# Patient Record
Sex: Female | Born: 1940 | Race: Black or African American | Hispanic: No | Smoking: Never smoker
Health system: Southern US, Community
[De-identification: ages and names within clinical notes are randomized; demographics above are authoritative.]

## PROBLEM LIST (undated history)

## (undated) DIAGNOSIS — J45901 Unspecified asthma with (acute) exacerbation: Secondary | ICD-10-CM

## (undated) DIAGNOSIS — I10 Essential (primary) hypertension: Secondary | ICD-10-CM

## (undated) DIAGNOSIS — M1990 Unspecified osteoarthritis, unspecified site: Secondary | ICD-10-CM

## (undated) DIAGNOSIS — C801 Malignant (primary) neoplasm, unspecified: Secondary | ICD-10-CM

## (undated) DIAGNOSIS — J189 Pneumonia, unspecified organism: Secondary | ICD-10-CM

## (undated) DIAGNOSIS — Z5189 Encounter for other specified aftercare: Secondary | ICD-10-CM

## (undated) DIAGNOSIS — B1920 Unspecified viral hepatitis C without hepatic coma: Secondary | ICD-10-CM

## (undated) HISTORY — PX: JOINT REPLACEMENT: SHX530

## (undated) HISTORY — PX: OTHER SURGICAL HISTORY: SHX169

---

## 1998-01-26 ENCOUNTER — Ambulatory Visit (HOSPITAL_COMMUNITY): Admission: RE | Admit: 1998-01-26 | Discharge: 1998-01-26 | Payer: Self-pay | Admitting: Family Medicine

## 1998-05-14 ENCOUNTER — Encounter: Admission: RE | Admit: 1998-05-14 | Discharge: 1998-08-12 | Payer: Self-pay | Admitting: Orthopaedic Surgery

## 1999-07-15 ENCOUNTER — Other Ambulatory Visit: Admission: RE | Admit: 1999-07-15 | Discharge: 1999-07-15 | Payer: Self-pay | Admitting: Family Medicine

## 1999-09-03 ENCOUNTER — Ambulatory Visit (HOSPITAL_COMMUNITY): Admission: RE | Admit: 1999-09-03 | Discharge: 1999-09-03 | Payer: Self-pay | Admitting: Family Medicine

## 2000-05-29 ENCOUNTER — Ambulatory Visit (HOSPITAL_COMMUNITY): Admission: RE | Admit: 2000-05-29 | Discharge: 2000-05-29 | Payer: Self-pay | Admitting: Orthopaedic Surgery

## 2000-10-30 ENCOUNTER — Ambulatory Visit (HOSPITAL_COMMUNITY): Admission: RE | Admit: 2000-10-30 | Discharge: 2000-10-30 | Payer: Self-pay

## 2000-10-30 ENCOUNTER — Encounter: Payer: Self-pay | Admitting: Family Medicine

## 2000-12-19 ENCOUNTER — Encounter: Payer: Self-pay | Admitting: Emergency Medicine

## 2000-12-19 ENCOUNTER — Emergency Department (HOSPITAL_COMMUNITY): Admission: EM | Admit: 2000-12-19 | Discharge: 2000-12-19 | Payer: Self-pay | Admitting: Emergency Medicine

## 2001-12-13 ENCOUNTER — Ambulatory Visit (HOSPITAL_COMMUNITY): Admission: RE | Admit: 2001-12-13 | Discharge: 2001-12-13 | Payer: Self-pay | Admitting: Family Medicine

## 2002-01-31 ENCOUNTER — Emergency Department (HOSPITAL_COMMUNITY): Admission: EM | Admit: 2002-01-31 | Discharge: 2002-01-31 | Payer: Self-pay | Admitting: *Deleted

## 2002-02-21 ENCOUNTER — Encounter: Payer: Self-pay | Admitting: Emergency Medicine

## 2002-02-21 ENCOUNTER — Encounter: Payer: Self-pay | Admitting: Surgery

## 2002-02-21 ENCOUNTER — Inpatient Hospital Stay (HOSPITAL_COMMUNITY): Admission: EM | Admit: 2002-02-21 | Discharge: 2002-02-22 | Payer: Self-pay | Admitting: Emergency Medicine

## 2002-02-21 ENCOUNTER — Encounter (INDEPENDENT_AMBULATORY_CARE_PROVIDER_SITE_OTHER): Payer: Self-pay

## 2002-02-21 HISTORY — PX: CHOLECYSTECTOMY: SHX55

## 2002-10-25 ENCOUNTER — Emergency Department (HOSPITAL_COMMUNITY): Admission: EM | Admit: 2002-10-25 | Discharge: 2002-10-25 | Payer: Self-pay

## 2002-10-25 ENCOUNTER — Encounter: Payer: Self-pay | Admitting: Emergency Medicine

## 2003-03-19 ENCOUNTER — Emergency Department (HOSPITAL_COMMUNITY): Admission: EM | Admit: 2003-03-19 | Discharge: 2003-03-19 | Payer: Self-pay | Admitting: Emergency Medicine

## 2003-10-09 ENCOUNTER — Emergency Department (HOSPITAL_COMMUNITY): Admission: EM | Admit: 2003-10-09 | Discharge: 2003-10-09 | Payer: Self-pay | Admitting: Emergency Medicine

## 2003-10-17 ENCOUNTER — Emergency Department (HOSPITAL_COMMUNITY): Admission: EM | Admit: 2003-10-17 | Discharge: 2003-10-17 | Payer: Self-pay | Admitting: Family Medicine

## 2004-04-22 ENCOUNTER — Ambulatory Visit (HOSPITAL_COMMUNITY): Admission: RE | Admit: 2004-04-22 | Discharge: 2004-04-22 | Payer: Self-pay | Admitting: Family Medicine

## 2004-07-01 ENCOUNTER — Ambulatory Visit: Payer: Self-pay | Admitting: Family Medicine

## 2004-07-03 ENCOUNTER — Inpatient Hospital Stay (HOSPITAL_COMMUNITY): Admission: EM | Admit: 2004-07-03 | Discharge: 2004-07-08 | Payer: Self-pay | Admitting: Emergency Medicine

## 2004-07-31 ENCOUNTER — Ambulatory Visit: Payer: Self-pay | Admitting: Family Medicine

## 2004-08-27 ENCOUNTER — Ambulatory Visit: Payer: Self-pay | Admitting: Gastroenterology

## 2004-11-29 ENCOUNTER — Ambulatory Visit: Payer: Self-pay | Admitting: Family Medicine

## 2005-01-30 ENCOUNTER — Emergency Department (HOSPITAL_COMMUNITY): Admission: EM | Admit: 2005-01-30 | Discharge: 2005-01-30 | Payer: Self-pay | Admitting: Emergency Medicine

## 2005-05-14 ENCOUNTER — Ambulatory Visit (HOSPITAL_COMMUNITY): Admission: RE | Admit: 2005-05-14 | Discharge: 2005-05-14 | Payer: Self-pay | Admitting: Family Medicine

## 2005-05-26 ENCOUNTER — Ambulatory Visit: Payer: Self-pay | Admitting: Family Medicine

## 2005-06-05 ENCOUNTER — Emergency Department (HOSPITAL_COMMUNITY): Admission: EM | Admit: 2005-06-05 | Discharge: 2005-06-05 | Payer: Self-pay | Admitting: Emergency Medicine

## 2005-06-18 ENCOUNTER — Emergency Department (HOSPITAL_COMMUNITY): Admission: EM | Admit: 2005-06-18 | Discharge: 2005-06-18 | Payer: Self-pay | Admitting: Emergency Medicine

## 2005-08-06 ENCOUNTER — Emergency Department (HOSPITAL_COMMUNITY): Admission: EM | Admit: 2005-08-06 | Discharge: 2005-08-06 | Payer: Self-pay | Admitting: Emergency Medicine

## 2005-08-09 ENCOUNTER — Emergency Department (HOSPITAL_COMMUNITY): Admission: EM | Admit: 2005-08-09 | Discharge: 2005-08-09 | Payer: Self-pay | Admitting: Emergency Medicine

## 2005-08-16 ENCOUNTER — Emergency Department (HOSPITAL_COMMUNITY): Admission: EM | Admit: 2005-08-16 | Discharge: 2005-08-16 | Payer: Self-pay | Admitting: Family Medicine

## 2005-10-02 ENCOUNTER — Ambulatory Visit: Payer: Self-pay | Admitting: Family Medicine

## 2005-10-06 ENCOUNTER — Ambulatory Visit (HOSPITAL_COMMUNITY): Admission: RE | Admit: 2005-10-06 | Discharge: 2005-10-06 | Payer: Self-pay | Admitting: Family Medicine

## 2005-10-17 ENCOUNTER — Ambulatory Visit: Payer: Self-pay | Admitting: Family Medicine

## 2006-01-13 ENCOUNTER — Ambulatory Visit: Payer: Self-pay | Admitting: Family Medicine

## 2006-01-19 ENCOUNTER — Ambulatory Visit (HOSPITAL_COMMUNITY): Admission: RE | Admit: 2006-01-19 | Discharge: 2006-01-19 | Payer: Self-pay | Admitting: Family Medicine

## 2006-02-13 ENCOUNTER — Ambulatory Visit (HOSPITAL_COMMUNITY): Admission: RE | Admit: 2006-02-13 | Discharge: 2006-02-13 | Payer: Self-pay | Admitting: Family Medicine

## 2006-02-19 ENCOUNTER — Ambulatory Visit: Payer: Self-pay | Admitting: Family Medicine

## 2006-07-22 ENCOUNTER — Ambulatory Visit (HOSPITAL_COMMUNITY): Admission: RE | Admit: 2006-07-22 | Discharge: 2006-07-22 | Payer: Self-pay | Admitting: Family Medicine

## 2006-08-12 ENCOUNTER — Ambulatory Visit (HOSPITAL_COMMUNITY): Admission: RE | Admit: 2006-08-12 | Discharge: 2006-08-13 | Payer: Self-pay | Admitting: Orthopedic Surgery

## 2006-08-12 HISTORY — PX: OTHER SURGICAL HISTORY: SHX169

## 2006-12-09 ENCOUNTER — Ambulatory Visit: Payer: Self-pay | Admitting: Family Medicine

## 2007-01-07 ENCOUNTER — Encounter: Payer: Self-pay | Admitting: Cardiology

## 2007-01-07 ENCOUNTER — Ambulatory Visit (HOSPITAL_COMMUNITY): Admission: RE | Admit: 2007-01-07 | Discharge: 2007-01-07 | Payer: Self-pay | Admitting: Family Medicine

## 2007-02-03 ENCOUNTER — Emergency Department (HOSPITAL_COMMUNITY): Admission: EM | Admit: 2007-02-03 | Discharge: 2007-02-03 | Payer: Self-pay | Admitting: Emergency Medicine

## 2007-08-09 ENCOUNTER — Emergency Department (HOSPITAL_COMMUNITY): Admission: EM | Admit: 2007-08-09 | Discharge: 2007-08-09 | Payer: Self-pay | Admitting: Family Medicine

## 2007-09-25 ENCOUNTER — Emergency Department (HOSPITAL_COMMUNITY): Admission: EM | Admit: 2007-09-25 | Discharge: 2007-09-25 | Payer: Self-pay | Admitting: Emergency Medicine

## 2007-09-30 ENCOUNTER — Ambulatory Visit (HOSPITAL_COMMUNITY): Admission: RE | Admit: 2007-09-30 | Discharge: 2007-09-30 | Payer: Self-pay | Admitting: Family Medicine

## 2007-10-07 ENCOUNTER — Ambulatory Visit: Payer: Self-pay | Admitting: Family Medicine

## 2007-10-07 LAB — CONVERTED CEMR LAB
ALT: 20 units/L (ref 0–35)
AST: 28 units/L (ref 0–37)
BUN: 13 mg/dL (ref 6–23)
Basophils Relative: 1 % (ref 0–1)
Creatinine, Ser: 0.71 mg/dL (ref 0.40–1.20)
Eosinophils Absolute: 0.2 10*3/uL (ref 0.0–0.7)
MCHC: 31.5 g/dL (ref 30.0–36.0)
MCV: 93.6 fL (ref 78.0–100.0)
Monocytes Absolute: 0.6 10*3/uL (ref 0.1–1.0)
Monocytes Relative: 7 % (ref 3–12)
Neutrophils Relative %: 44 % (ref 43–77)
RBC: 4.51 M/uL (ref 3.87–5.11)
RDW: 13.8 % (ref 11.5–15.5)

## 2008-07-02 ENCOUNTER — Emergency Department (HOSPITAL_COMMUNITY): Admission: EM | Admit: 2008-07-02 | Discharge: 2008-07-02 | Payer: Self-pay | Admitting: Emergency Medicine

## 2008-11-19 ENCOUNTER — Emergency Department (HOSPITAL_COMMUNITY): Admission: EM | Admit: 2008-11-19 | Discharge: 2008-11-19 | Payer: Self-pay | Admitting: Emergency Medicine

## 2008-11-23 ENCOUNTER — Ambulatory Visit (HOSPITAL_COMMUNITY): Admission: RE | Admit: 2008-11-23 | Discharge: 2008-11-23 | Payer: Self-pay | Admitting: Family Medicine

## 2008-12-13 ENCOUNTER — Emergency Department (HOSPITAL_COMMUNITY): Admission: EM | Admit: 2008-12-13 | Discharge: 2008-12-13 | Payer: Self-pay | Admitting: Emergency Medicine

## 2009-01-23 ENCOUNTER — Encounter: Admission: RE | Admit: 2009-01-23 | Discharge: 2009-01-23 | Payer: Self-pay | Admitting: Family Medicine

## 2009-04-06 ENCOUNTER — Emergency Department (HOSPITAL_COMMUNITY): Admission: EM | Admit: 2009-04-06 | Discharge: 2009-04-06 | Payer: Self-pay | Admitting: Emergency Medicine

## 2009-12-20 ENCOUNTER — Ambulatory Visit (HOSPITAL_COMMUNITY): Admission: RE | Admit: 2009-12-20 | Discharge: 2009-12-20 | Payer: Self-pay | Admitting: Family Medicine

## 2010-11-30 LAB — BASIC METABOLIC PANEL
BUN: 8 mg/dL (ref 6–23)
CO2: 33 mEq/L — ABNORMAL HIGH (ref 19–32)
Calcium: 8.9 mg/dL (ref 8.4–10.5)
Creatinine, Ser: 0.54 mg/dL (ref 0.4–1.2)
Glucose, Bld: 109 mg/dL — ABNORMAL HIGH (ref 70–99)

## 2010-11-30 LAB — CBC
MCHC: 33.3 g/dL (ref 30.0–36.0)
RDW: 14.1 % (ref 11.5–15.5)

## 2010-11-30 LAB — POCT CARDIAC MARKERS
CKMB, poc: 1.2 ng/mL (ref 1.0–8.0)
Myoglobin, poc: 96.1 ng/mL (ref 12–200)

## 2010-11-30 LAB — DIFFERENTIAL
Basophils Absolute: 0.1 10*3/uL (ref 0.0–0.1)
Basophils Relative: 1 % (ref 0–1)
Monocytes Absolute: 0.6 10*3/uL (ref 0.1–1.0)
Neutro Abs: 4.4 10*3/uL (ref 1.7–7.7)
Neutrophils Relative %: 61 % (ref 43–77)

## 2011-01-10 NOTE — Op Note (Signed)
[facility]  Patient:    Peggy Welch, Peggy Welch Visit Number: [id number] MRN: [id number]          Service Type: SUR Location: 3W 0345 02 Attending Physician:  Charlton Haws Proc. Date: 02/21/02 Admit Date:  02/21/2002 Discharge Date: 02/22/2002                             Operative Report  PREOPERATIVE DIAGNOSIS:  Biliary colic and probable acute cholecystitis.  POSTOPERATIVE DIAGNOSIS:  Biliary colic and chronic calculus cholecystitis.  PROCEDURE PERFORMED:  Laparoscopic cholecystectomy with intraoperative cholangiogram.  SURGEON:  Currie Paris, M.D.  ASSISTANT:  Ollen Gross. Vernell Morgans, M.D.  ANESTHESIA:  General endotracheal.  INDICATION:  This patient is a 70 year old lady who presented to the emergency room this morning having eaten pizza the night before and developed severe right upper quadrant pain, nausea, and vomiting.  Workup showed normal labs, but she had a dilated gallbladder and stones.  She remained tender in the right upper quadrant and continued to have some mild pain even after Toradol, so we elected to proceed to cholecystectomy today.  DESCRIPTION OF PROCEDURE:  The patient was seen in the holding area.  She had no further questions and understood the plans.  She was taken to the operating room and after satisfactory general endotracheal anesthesia had been obtained, the abdomen was prepped and draped.  A total of 0.25% plain Marcaine was used for each incision and the umbilical incision made first.  The fascia was picked up and the peritoneal cavity was entered.  The Hasson was then introduced and the abdomen insufflated to 15.  On placing the camera, we saw no significant abnormalities, although the gallbladder was distended, and there were a lot of adhesions to it.  The patient was placed in reverse Trendelenburg and tilted to the left and an 11 trocar placed in the epigastrium and two 5s bilaterally.  The omental  adhesions were taken down. I had to put a couple of clips on the omentum because there was a small bleeder where the omentum was stuck to the liver.  We were able to get the omentum stripped off of the gallbladder down to the neck of the gallbladder and identified the ampulla.  I identified the cystic artery and the cystic duct and had the triangle of Calot exposed by opening the peritoneum on both sides.  Once I had the anatomy clarified, I put a single clip on the artery and a single clip on the cystic duct really with conjunction with the gallbladder. This was opened and some bile milked out.  A angiocath was used to thread a reddy catheter into the abdominal cavity and this was placed in the cystic duct and held with a clip.  Operative cholangiography showed a fairly long cystic duct with good filling of the common duct and duodenum and filling of the hepatic radicals.  There were no filling defects.  The cystic duct clip was removed and the catheter pulled out.  Three clips were placed on the stay side of the cystic duct and three total on the artery and the duct and artery divided.  The gallbladder was removed from below to above with coagulation in front of the cautery.  It was fairly thin and we spilled a little bile which was suctioned up and once the gallbladder was disconnected, we put in a bag to bring up the abdominal wall.  We spent  several minutes irrigating to make sure everything was dry and once we thought hemostasis was complete, the gallbladder was grasped and brought out through the umbilical port.  The abdomen was reinsufflated and a final irrigation done.  Again, everything appeared to be dry.  The lateral port was removed and there was no bleeding.  The umbilical port was closed with a pursestring.  The abdomen was deflated through the epigastric port.  The skin incisions were closed with 4-0 Monocryl subcuticular plus Steri-Strips.  The patient tolerated the  procedure well.  There were no operative complications.  All counts were correct. Attending Physician:  Charlton Haws DD:  02/21/02 TD:  02/23/02 Job: [id number]

## 2011-01-10 NOTE — H&P (Signed)
[facility]  Patient:    Peggy Welch, Peggy Welch Visit Number: [id number] MRN: [id number]          Service Type: SUR Location: 3W 0345 02 Attending Physician:  Tobey Bride Dictated by:   Currie Paris, M.D. Admit Date:  02/21/2002                           History and Physical  ACCOUNT NO. [id number].  CHIEF COMPLAINT:  Abdominal pain.  HISTORY OF PRESENT ILLNESS:  This is a generally healthy 70 year old lady who ate pizza last night and developed severe abdominal pain, primarily epigastric-right upper quadrant, around bedtime, and presented to the emergency room.  She has been nauseated, vomited three times.  She received some Toradol in the ER and her pain is somewhat better, but she is still hurting in the right upper quadrant.  She relates that she has generally been able to eat anything she wants, although she had a very similar episode to this that did not last as long from eating some hamburger.  At the time she presented to the emergency room, she said her severity was 9 on a scale of 1 to 10, with 10 being very severe.  She could not find anything that relieved it but, as noted, she is feeling better having had the pain medicine.  PAST MEDICAL HISTORY:  Surgeries:  She has had two knee replacements.  She has also had a C-section.  MEDICATIONS:  She takes hydrochlorothiazide.  ALLERGIES:  None known.  HABITS:  Smokes:  None.  Alcohol:  None.  FAMILY HISTORY:  Unremarkable.  REVIEW OF SYSTEMS:  HEENT:  Negative.  CHEST:  Questionable history of some asthma.   HEART:  History of some mild hypertension.  ABDOMEN:  Negative except for HPI.  GENITOURINARY:  Negative.  PHYSICAL EXAMINATION:  GENERAL:  The patient is seen approximately 10:30, was alert, awake.  HEENT:  Head is normocephalic.  Eyes nonicteric.  Pupils round and regular.  NECK:  Supple, no masses or thyromegaly.  CHEST:  Lungs clear to auscultation.  CARDIAC:   Regular.  No murmurs, rubs, or gallops.  ABDOMEN:  Generally soft, but she has some definite tenderness in the right upper quadrant, especially with deep inspiration.  There is no guarding, no rebound, and bowel sounds are normal.  EXTREMITIES:  No cyanosis or edema.  She has good range of motion.  LABORATORY DATA:  Basically negative except for some mild hypokalemia with a potassium of 3.3.  Ultrasound shows a dilated gallbladder with a stone that looks like it is closer to the neck.  Does not look like a thick wall, however.  IMPRESSION: 1. Biliary colic and possibly developing cholecystitis and cholelithiasis. 2. Hypokalemia.  PLAN:  I think she ought to go ahead with a laparoscopic cholecystectomy, and I explained the indications, risks, complications, including conversion to open, bleeding, infection, etc.  I think all questions have been answered.  We are going to try to get her done today.  In the meantime we will give her some IV fluids and try to replete her potassium. Dictated by:   Currie Paris, M.D. Attending Physician:  Tobey Bride DD:  02/21/02 TD:  02/22/02 Job: [id number]

## 2011-01-10 NOTE — Discharge Summary (Signed)
[facility]  Patient:    Peggy Welch, Peggy Welch Visit Number: [id number] MRN: [id number]          Service Type: SUR Location: 3W 0345 02 Attending Physician:  Charlton Haws Dictated by:   Currie Paris, M.D. Admit Date:  02/21/2002 Discharge Date: 02/22/2002                             Discharge Summary  FINAL DIAGNOSES: 1. Chronic cholecystitis, cholelithiasis, and cholesterolosis. 2. Hypokalemia.  CLINICAL HISTORY:  The patient is a 69 year old lady with known gallstones who presented to the emergency room with an acute episode of biliary pain and right upper quadrant tenderness.  She was noted to be on exam, have some tenderness and elected to proceed to surgery.  HOSPITAL COURSE:  The patient was taken to the operating room. Preoperatively, she was given some potassium.  At the time of surgery she had chronic calculus cholecystitis with perhaps a little distention suggesting an early process developing.  A laparoscopic cholecystectomy and operative cholangiogram were done.  She tolerated the procedure well.  The next day she felt good and was ready to go home.  Her vital signs were stable.  She was alert, lungs were clear, her abdomen was soft and benign.  The patient is discharged in satisfactory condition, to follow up in my office in a couple of weeks.  DIET:  Regular.  ACTIVITY:  As tolerated.Dictated by:   Currie Paris, M.D. Attending Physician:  Charlton Haws DD:  03/16/02 TD:  03/22/02 Job: [id number]
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
## 2011-01-10 NOTE — H&P (Signed)
NAMEHENYA, Welch NO.:  [id number]   MEDICAL RECORD NO.:  [id number]          PATIENT TYPE:  INP   LOCATION:  0101                         FACILITY:  [facility]   PHYSICIAN:  Elliot Cousin, M.D.    DATE OF BIRTH:  1940/09/17   DATE OF ADMISSION:  07/02/2004  DATE OF DISCHARGE:                                HISTORY & PHYSICAL   PRIMARY CARE PHYSICIAN:  Dr. Dow Adolph at [facility].   CHIEF COMPLAINT:  Cough, shortness of breath, wheezing.   HISTORY OF PRESENT ILLNESS:  Peggy Welch is a 70 year old lady with a past  medical history significant for asthma who presents to the emergency  department with a 3 to 4-day history of dry cough, shortness of breath with  exertion, and increasing wheezing. The patient has a history of asthma. Her  last asthma attack was a little less than a year ago. The patient states  that her symptoms started with a cold approximately 4 days ago. She  developed a dry cough and an earache but no sore throat, runny nose or  congestion.  The patient also had subjective and objective fever and chills  at home.  At one point she measured her temperature at 102 degrees  Fahrenheit.  The patient presented to her primary care physician's office  the day before yesterday. She saw Dr. Audria Nine. Per the patient history Dr.  Audria Nine started treatment with antibiotic ear drops, oral antibiotic, and  albuterol MDI. The patient's symptoms did not improve. The patient also had  loose stools several times approximately a week ago. The diarrhea has now  resolved.  The patient has also had some intermittent nausea but no vomiting  and no abdominal pain. She denies chest pain. She denies orthopnea. No  recent history of leg swelling. No abdominal pain. No pain with urination.   The patient says that she rarely uses her albuterol inhaler because her  asthma has been well controlled.  She has a questionable history of an  intubation in the 1990's,  however she is not certain if the intubation was  surrounding her asthma or another reason. When the patient was evaluated in  the emergency department her temperature was 100.4. She was oxygenating 94%  on room air.  She was hemodynamically stable.  Her white count was within  normal limits at 5.6 thousand.  Her ABG on room air revealed a pH of 7.4,  PCO2 of 39, PO2 of 64. The patient is still somewhat symptomatic and will  therefore be admitted for further evaluation and management.   PAST MEDICAL HISTORY:  1.  Asthma diagnosed in 1985. Question history of intubation for asthma      versus another etiology.  2.  Prolonged hospitalization including ventilation in the 1990's, the      patient does not remember the etiology.  3.  Seasonal allergies.  4.  Hepatitis C, diagnosed approximately 10 years ago. No history of      interferon treatment.  5.  Hypertension.  6.  Status post bilateral knee replacements.  7.  Status post laparoscopic cholecystectomy in 2003  by Dr. Jamey Ripa.  8.  Status post left breast biopsy in the past which was negative.  9.  Obesity.   MEDICATIONS:  1.  Hydrochlorothiazide 25 mg daily.  2.  Allegra 180 mg daily.  3.  Advair of questioned dose, 1 inhalation daily.  4.  Albuterol MDI 2 puffs p.r.n.  5.  Multivitamin with iron 1 daily.  6.  Aspirin 81 mg daily.   ALLERGIES:  No known drug allergies.   SOCIAL HISTORY:  The [place] separated. [place] She has 7 children. She is retired from [employer].  She denies tobacco and alcohol use. She denies illicit drug use.  She can  read and write.   FAMILY HISTORY:  Her father died in his 44's (cause of death unknown). Her  mother died of heart failure at 64 years of age.   REVIEW OF SYSTEMS:  The patient's review of systems is positive for  bilateral knee pain, occasional swelling in right greater than left ankles.  Occasional low back pain, occasional stress  urinary incontinence  particularly when coughing. Otherwise her review of systems is negative.   PHYSICAL EXAMINATION:  VITAL SIGNS:  Temperature 100.4, blood pressure  172/107, repeated at 144/74, pulse 92, respiratory rate 28, repeated at 20,  oxygen saturation 94% on room air.  GENERAL:  The patient is a pleasant, 70 year old obese African-American  woman who is currently lying in bed in no acute distress.  HEENT:  Head is normocephalic, atraumatic. Pupils are equal, round and  reactive to light.  Extraocular movements are intact. Conjunctivae are  clear. Sclerae are white. Tympanic membranes are clear bilaterally, no  evidence of tympanic membrane erythema or exudates or edema.  Oropharynx  reveals upper partial dentures and native teeth at the bottom with multiple  missing teeth. Mucous membranes are moist, no posterior exudates or  erythema.  NECK:  Supple, obese, no adenopathy, no thyromegaly, no bruit, no JVD.  LUNGS: The patient has mild diffuse expiratory wheezing throughout all lung  fields. She had several dry coughing episodes during the lung exam. Her  breathing is non labored.  HEART:  S1, S2 with no murmurs, rubs or gallops.  ABDOMEN:  Well-healed epigastric and right upper quadrant abdominal scars.  Abdomen is obese, positive bowel sounds, soft, nontender, nondistended. No  hepatosplenomegaly. No mass is palpated.  GU/RECTAL:  Deferred.  EXTREMITIES:  Pedal pulses 2+ bilaterally. The patient has a good range of  motion of all extremities and joints with exception of the left knee with  very limited flexion and extension of the knees. She does have palpable  prosthetic knees bilaterally.  No pretibial edema, no pedal edema  bilaterally.  NEUROLOGIC:  The patient is alert and oriented x3. Cranial nerves II-XII  intact. Strength is 5/5 throughout with exception of flexion of the left  knee. Sensation is intact throughout. Cerebellar is intact.  ADMISSION LABORATORIES:   Chest x-ray reveals no acute disease.  WBC 5600,  hemoglobin 13.3, platelets 329,000.  Sodium 133, potassium 4.3, chloride  101, CO2 26, glucose 102, BUN 8, creatinine 0.8, calcium 8.5, total protein  7.1, albumin 3.5.  ABG on room air:  PH 7.447, PCO2 39, PO2 64.2.   ASSESSMENT:  Cough, shortness of breath and wheezing all consistent with  asthma or bronchitic asthma exacerbation.  The patient did have a low-grade  fever in the emergency department earlier, and history of overt fever at  home.  The  patient was started on outpatient treatment with antibiotics  although she does not recall the name of the antibiotic.  It was prescribed  2 days ago.   PLAN:  1.  The patient was given Solu-Medrol 125 mg IV x1 by the emergency      department physician.  2.  Will continue steroid treatment with Solu-Medrol 60 mg IV q.8h. Will      continue Advair inhaler b.i.d. Will also continue albuterol nebulizers      q.4h. The patient was given several nebulizer treatments in the      emergency department.  3.  Will start azithromycin 500 mg daily for possible infectious/bacterial      bronchitis.  4.  Will treat symptomatically with Tussionex q.12h p.r.n. as well as      Tessalon Perles.  5.  Will also continue H1 blocker therapy with Claritin 10 mg daily and will      add Pepcid 20 mg b.i.d. as an H2 blocker.  6.  Continue oxygen therapy.  7.  Will cover anticipated elevated blood sugars with a sliding scale      Insulin regimen.     Deni   DF/MEDQ  D:  07/03/2004  T:  07/03/2004  Job:  [id number]   cc:   Maurice March, M.D.  [address] [place]  [place] [address]  Fax: [phone]

## 2011-01-10 NOTE — Discharge Summary (Signed)
NAMEINGER, WIEST             ACCOUNT NO.:  [id number]   MEDICAL RECORD NO.:  [id number]          PATIENT TYPE:  INP   LOCATION:  0343                         FACILITY:  [facility]   PHYSICIAN:  Mobolaji B. Bakare, M.D.DATE OF BIRTH:  [birth year]   DATE OF ADMISSION:  07/02/2004  DATE OF DISCHARGE:  07/08/2004                                 DISCHARGE SUMMARY   PRIMARY CARE PHYSICIAN:  Dr. Audria Nine at [facility]   FINAL DIAGNOSES:  1.  Acute exacerbation of asthma.  2.  Allergic rhinitis.  3.  Hypertension.  4.  Acute bronchitis.   BRIEF HISTORY:  [name] is a 70 year old African-American female with  significant past medical history of asthma and seasonal allergies who  presented with cough, shortness of breath, and wheezing, which did not  respond to regular home regimen.  These acute exacerbations were proceeded  by a cold for about 3-4 days.  At home, she checked her temperature, and it  was 102 degrees Fahrenheit.  She saw Dr. Audria Nine in the office and was  given treatment, but these did not improvement and necessitated the current  hospitalization.  Kindly refer to the H&P for further details.   PERTINENT PHYSICAL FINDINGS:  VITAL SIGNS:  On initial evaluation,  temperature 100.4, blood pressure 172/107, pulse 92, respiratory rate 28,  and oxygen saturation 94%.  GENERAL:  Patient was fairly obese and was not in acute distress.  LUNGS:  The main findings were in the lungs.  She had mild expiratory wheeze  in both lung fields and had several bouts of coughing during the  examination.  There was fairly good air entry in both lungs.  CARDIOVASCULAR:  S1 and S2.  ABDOMEN:  Unremarkable.   LABORATORY FINDINGS:  On admission, ABG with a pH of 7.45, pCO2 39, pO2 64.  Sodium 133, potassium 4.3, chloride 101, CO2 26, BUN 8, creatinine 0.8,  calcium 8.5, albumin 3.5, total protein 7.1, magnesium 2.5.   Chest x-ray showed stable, mild chronic lung disease without any  acute  findings.   HOSPITAL COURSE:  1.  Acute asthmatic exacerbation:  Patient was started on nebulization with      albuterol and Atrovent.  In addition, she was given Advair.  At the time      of her initial evaluation, patient could not recall the dose of Advair;      however, she did recall that she was using 250/50 once daily.  Also      included in her treatment was Solu-Medrol IV, famotidine.  Patient was      started on Singulair, but she could not tolerate it secondary to      dizziness.   1.  Acute bronchitis:  Patient was continued on antibiotics with Zithromax      and Tessalon Perles for cough.  This improved, and she remained afebrile      during the cause of hospitalization.   1.  Allergic rhinosinusitis:  Patient was started on Claritin as Allegra is      nonformullary.  In addition, she received Flonase for nasal congestion.  The symptoms improved gradually.   1.  Hypertension:  Her blood pressure was fairly controlled during the      hospitalization with the institution of hydrochlorothiazide.   DISCHARGE MEDICATIONS:  1.  Albuterol inhaler to use as needed.  2.  Albuterol nebs to use as needed.  3.  Advair 250/50 1 puff b.i.d.  4.  Prednisone to use as directed.  This is a tapering dose over 13 days.  5.  Zithromax 500 mg 1 p.o. q.d. x5 days.  6.  Pepcid 20 mg p.o. b.i.d. x2 weeks.  7.  Flonase 1 spray in both nostrils once daily x6-8 weeks.  8.  Tessalon Perles __________ p.o. b.i.d. x1 week.  9.  Aspirin 81 mg once daily.  10. Hydrochlorothiazide 25 mg p.o. q.d.  11. Allegra 180 mg once daily, use as before.   DIET:  Low salt diet.   FOLLOW UP:  1.  Patient is to follow up with Dr. Audria Nine in 1-2 weeks at [facility]      clinic.  2.  She also has an appointment to follow up with gastroenterology regarding      the diagnosis of hepatitis C.  Patient was encouraged to follow up with      the gastroenterologist.   CONDITION ON DISCHARGE:   Patient was stable.  Lungs were clear clinically at  discharge.  Patient was saturating at more than 90% on room air.  She was  not short of breath on ambulation.      MBB/MEDQ  D:  07/08/2004  T:  07/08/2004  Job:  [id number]   cc:   Maurice March, M.D.  [address] [place]  [place] [address]  Fax: [phone]

## 2011-01-22 ENCOUNTER — Other Ambulatory Visit: Payer: Self-pay | Admitting: Family Medicine

## 2011-01-22 DIAGNOSIS — Z1231 Encounter for screening mammogram for malignant neoplasm of breast: Secondary | ICD-10-CM

## 2011-01-30 ENCOUNTER — Ambulatory Visit
Admission: RE | Admit: 2011-01-30 | Discharge: 2011-01-30 | Disposition: A | Discharge status: Home / Self Care | Payer: Medicare Other | Source: Ambulatory Visit | Attending: Family Medicine | Admitting: Family Medicine

## 2011-01-30 DIAGNOSIS — Z1231 Encounter for screening mammogram for malignant neoplasm of breast: Secondary | ICD-10-CM

## 2011-05-15 LAB — URINALYSIS, ROUTINE W REFLEX MICROSCOPIC
Bilirubin Urine: NEGATIVE
Hgb urine dipstick: NEGATIVE
Specific Gravity, Urine: 1.02
Urobilinogen, UA: 0.2

## 2011-06-12 LAB — DIFFERENTIAL
Basophils Absolute: 0
Basophils Relative: 1
Eosinophils Absolute: 0.3
Eosinophils Relative: 5
Neutrophils Relative %: 57

## 2011-06-12 LAB — COMPREHENSIVE METABOLIC PANEL
ALT: 28
AST: 32
Alkaline Phosphatase: 64
CO2: 30
Calcium: 8.8
Chloride: 103
GFR calc Af Amer: >60
GFR calc non Af Amer: >60
Glucose, Bld: 104 — ABNORMAL HIGH
Sodium: 142
Total Bilirubin: 0.9

## 2011-06-12 LAB — D-DIMER, QUANTITATIVE: D-Dimer, Quant: 0.37

## 2011-06-12 LAB — CBC
Hemoglobin: 12.1
MCHC: 33.1
MCV: 89.9
RBC: 4.05
WBC: 7.4

## 2011-09-26 DIAGNOSIS — E119 Type 2 diabetes mellitus without complications: Secondary | ICD-10-CM | POA: Diagnosis not present

## 2011-10-14 ENCOUNTER — Inpatient Hospital Stay (HOSPITAL_COMMUNITY)
Admission: EM | Admit: 2011-10-14 | Discharge: 2011-10-16 | DRG: 202 | Disposition: A | Discharge status: Home / Self Care | Payer: Medicare Other | Attending: Internal Medicine | Admitting: Internal Medicine

## 2011-10-14 ENCOUNTER — Other Ambulatory Visit: Payer: Self-pay

## 2011-10-14 ENCOUNTER — Encounter (HOSPITAL_COMMUNITY): Payer: Self-pay | Admitting: Emergency Medicine

## 2011-10-14 ENCOUNTER — Emergency Department (HOSPITAL_COMMUNITY): Payer: Medicare Other

## 2011-10-14 DIAGNOSIS — Z79899 Other long term (current) drug therapy: Secondary | ICD-10-CM

## 2011-10-14 DIAGNOSIS — E669 Obesity, unspecified: Secondary | ICD-10-CM | POA: Diagnosis present

## 2011-10-14 DIAGNOSIS — I10 Essential (primary) hypertension: Secondary | ICD-10-CM | POA: Diagnosis present

## 2011-10-14 DIAGNOSIS — J45901 Unspecified asthma with (acute) exacerbation: Secondary | ICD-10-CM

## 2011-10-14 DIAGNOSIS — R5383 Other fatigue: Secondary | ICD-10-CM | POA: Diagnosis not present

## 2011-10-14 DIAGNOSIS — Z6838 Body mass index (BMI) 38.0-38.9, adult: Secondary | ICD-10-CM

## 2011-10-14 DIAGNOSIS — E876 Hypokalemia: Secondary | ICD-10-CM | POA: Diagnosis not present

## 2011-10-14 DIAGNOSIS — E871 Hypo-osmolality and hyponatremia: Secondary | ICD-10-CM | POA: Diagnosis present

## 2011-10-14 DIAGNOSIS — R0602 Shortness of breath: Secondary | ICD-10-CM | POA: Diagnosis not present

## 2011-10-14 DIAGNOSIS — J45909 Unspecified asthma, uncomplicated: Secondary | ICD-10-CM | POA: Diagnosis not present

## 2011-10-14 DIAGNOSIS — R5381 Other malaise: Secondary | ICD-10-CM | POA: Diagnosis not present

## 2011-10-14 HISTORY — DX: Essential (primary) hypertension: I10

## 2011-10-14 HISTORY — DX: Unspecified asthma with (acute) exacerbation: J45.901

## 2011-10-14 LAB — BASIC METABOLIC PANEL
BUN: 9 mg/dL (ref 6–23)
CO2: 30 mEq/L (ref 19–32)
Chloride: 95 mEq/L — ABNORMAL LOW (ref 96–112)
Chloride: 98 mEq/L (ref 96–112)
Creatinine, Ser: 0.62 mg/dL (ref 0.50–1.10)
GFR calc Af Amer: >90 mL/min (ref >90)
GFR calc Af Amer: >90 mL/min (ref >90)
GFR calc non Af Amer: 89 mL/min — ABNORMAL LOW (ref >90)
Potassium: 2.8 mEq/L — ABNORMAL LOW (ref 3.5–5.1)
Potassium: 2.8 mEq/L — ABNORMAL LOW (ref 3.5–5.1)
Sodium: 134 mEq/L — ABNORMAL LOW (ref 135–145)

## 2011-10-14 LAB — BLOOD GAS, ARTERIAL
Acid-Base Excess: 6.2 mmol/L — ABNORMAL HIGH (ref 0.0–2.0)
Drawn by: 295031
FIO2: 0.21 %
O2 Saturation: 94.1 %
Patient temperature: 98.6
pO2, Arterial: 68.2 mmHg — ABNORMAL LOW (ref 80.0–100.0)

## 2011-10-14 LAB — CBC
HCT: 37.3 % (ref 36.0–46.0)
MCHC: 33.5 g/dL (ref 30.0–36.0)
Platelets: 243 10*3/uL (ref 150–400)
RDW: 13.4 % (ref 11.5–15.5)
WBC: 7.7 10*3/uL (ref 4.0–10.5)

## 2011-10-14 LAB — MAGNESIUM: Magnesium: 2.4 mg/dL (ref 1.5–2.5)

## 2011-10-14 LAB — POCT I-STAT TROPONIN I: Troponin i, poc: 0 ng/mL (ref 0.00–0.08)

## 2011-10-14 MED ORDER — ALBUTEROL SULFATE (5 MG/ML) 0.5% IN NEBU: 2.5000 mg | INHALATION_SOLUTION | RESPIRATORY_TRACT | Status: DC | PRN

## 2011-10-14 MED ORDER — HYDROCHLOROTHIAZIDE 25 MG PO TABS
25.0000 mg | ORAL_TABLET | Freq: Every day | ORAL | Status: DC
  Administered 2011-10-15 – 2011-10-16 (×2): 25 mg via ORAL
  Filled 2011-10-14 (×3): qty 1

## 2011-10-14 MED ORDER — ALBUTEROL SULFATE (5 MG/ML) 0.5% IN NEBU
INHALATION_SOLUTION | RESPIRATORY_TRACT | Status: AC
  Filled 2011-10-14: qty 1

## 2011-10-14 MED ORDER — ALBUTEROL SULFATE (5 MG/ML) 0.5% IN NEBU
5.0000 mg | INHALATION_SOLUTION | Freq: Once | RESPIRATORY_TRACT | Status: AC
  Administered 2011-10-14: 5 mg via RESPIRATORY_TRACT
  Filled 2011-10-14: qty 1

## 2011-10-14 MED ORDER — POTASSIUM CHLORIDE CRYS ER 20 MEQ PO TBCR
40.0000 meq | EXTENDED_RELEASE_TABLET | Freq: Once | ORAL | Status: AC
  Administered 2011-10-14: 40 meq via ORAL
  Filled 2011-10-14: qty 2

## 2011-10-14 MED ORDER — ONDANSETRON 4 MG PO TBDP
4.0000 mg | ORAL_TABLET | Freq: Once | ORAL | Status: AC
  Administered 2011-10-14: 4 mg via ORAL
  Filled 2011-10-14: qty 1

## 2011-10-14 MED ORDER — SODIUM CHLORIDE 0.9 % IV SOLN
INTRAVENOUS | Status: DC
  Administered 2011-10-14 – 2011-10-15 (×3): via INTRAVENOUS

## 2011-10-14 MED ORDER — ONDANSETRON HCL 4 MG/2ML IJ SOLN: 4.0000 mg | Freq: Four times a day (QID) | INTRAMUSCULAR | Status: DC | PRN

## 2011-10-14 MED ORDER — SODIUM CHLORIDE 0.9 % IJ SOLN
3.0000 mL | Freq: Two times a day (BID) | INTRAMUSCULAR | Status: DC
  Administered 2011-10-14 – 2011-10-15 (×2): 3 mL via INTRAVENOUS

## 2011-10-14 MED ORDER — IPRATROPIUM BROMIDE 0.02 % IN SOLN
0.5000 mg | Freq: Once | RESPIRATORY_TRACT | Status: AC
  Administered 2011-10-14: 0.5 mg via RESPIRATORY_TRACT
  Filled 2011-10-14: qty 2.5

## 2011-10-14 MED ORDER — POTASSIUM CHLORIDE 10 MEQ/100ML IV SOLN
10.0000 meq | Freq: Once | INTRAVENOUS | Status: AC
  Administered 2011-10-14: 10 meq via INTRAVENOUS
  Filled 2011-10-14: qty 100

## 2011-10-14 MED ORDER — MAGNESIUM SULFATE 40 MG/ML IJ SOLN
2.0000 g | INTRAMUSCULAR | Status: AC
  Administered 2011-10-14: 2 g via INTRAVENOUS
  Filled 2011-10-14: qty 50

## 2011-10-14 MED ORDER — ONDANSETRON HCL 4 MG PO TABS: 4.0000 mg | ORAL_TABLET | Freq: Four times a day (QID) | ORAL | Status: DC | PRN

## 2011-10-14 MED ORDER — ALBUTEROL SULFATE (5 MG/ML) 0.5% IN NEBU
2.5000 mg | INHALATION_SOLUTION | RESPIRATORY_TRACT | Status: DC
  Administered 2011-10-14 (×3): 2.5 mg via RESPIRATORY_TRACT
  Filled 2011-10-14 (×3): qty 0.5

## 2011-10-14 MED ORDER — DEXTROSE 5 % IV SOLN
500.0000 mg | INTRAVENOUS | Status: DC
  Administered 2011-10-14 – 2011-10-15 (×2): 500 mg via INTRAVENOUS
  Filled 2011-10-14 (×3): qty 500

## 2011-10-14 MED ORDER — PREDNISONE 50 MG PO TABS
50.0000 mg | ORAL_TABLET | Freq: Every day | ORAL | Status: DC
  Administered 2011-10-15 – 2011-10-16 (×2): 50 mg via ORAL
  Filled 2011-10-14 (×3): qty 1

## 2011-10-14 MED ORDER — ALENDRONATE SODIUM 70 MG PO TABS: 70.0000 mg | ORAL_TABLET | ORAL | Status: DC

## 2011-10-14 MED ORDER — HYDROCODONE-ACETAMINOPHEN 5-325 MG PO TABS
1.0000 | ORAL_TABLET | ORAL | Status: DC | PRN
  Administered 2011-10-14 – 2011-10-15 (×2): 1 via ORAL
  Filled 2011-10-14 (×2): qty 1

## 2011-10-14 MED ORDER — POTASSIUM CHLORIDE CRYS ER 20 MEQ PO TBCR
40.0000 meq | EXTENDED_RELEASE_TABLET | Freq: Once | ORAL | Status: AC
Start: 2011-10-14 — End: 2011-10-14
  Administered 2011-10-14: 40 meq via ORAL
  Filled 2011-10-14: qty 2

## 2011-10-14 MED ORDER — ENOXAPARIN SODIUM 40 MG/0.4ML ~~LOC~~ SOLN
40.0000 mg | Freq: Every day | SUBCUTANEOUS | Status: DC
  Administered 2011-10-14 – 2011-10-15 (×2): 40 mg via SUBCUTANEOUS
  Filled 2011-10-14 (×5): qty 0.4

## 2011-10-14 NOTE — Progress Notes (Signed)
PHARMACIST - PHYSICIAN COMMUNICATION  CONCERNING: P&T Medication Policy Regarding Oral Bisphosphonates  RECOMMENDATION: Your order for alendronate (Fosamax) has been discontinued at this time.  If the patient's post-hospital medical condition warrants safe use of this class of drugs, please resume the pre-hospital regimen upon discharge.  DESCRIPTION:  Alendronate (Fosamax) can cause severe esophageal erosions in patients who are unable to remain upright at least 30 minutes after taking this medication.   Since brief interruptions in therapy are thought to have minimal impact on bone mineral density, the Pharmacy & Therapeutics Committee has established that bisphosphonate orders should be routinely discontinued during hospitalization.   To override this safety policy and permit administration of Boniva, Fosamax, or Actonel in the hospital, prescribers must write "DO NOT HOLD" in the comments section when placing the order for this class of medications.  Terrilee Files, PharmD 10/14/11 @ 16:03

## 2011-10-14 NOTE — ED Notes (Signed)
Per EMS and pt.  Pt began to have cough yesterday progressing to sob last night.  Tried home O2 and home albuterol neb with no improvement.  Upon arrival EMS noted SOB, wheezing.  5 albuterol neb given enroute.  No chest pain.  Pt states granddaughter had URI.  MD at bedside

## 2011-10-14 NOTE — ED Provider Notes (Signed)
History     CSN: [id number]  Arrival date & time 10/14/11  7829   First MD Initiated Contact with Patient 10/14/11 1004      Chief Complaint  Patient presents with  . Asthma    (Consider location/radiation/quality/duration/timing/severity/associated sxs/prior treatment) HPI History provided by pt.   71yo F w/ h/o HTN and asthma presents w/ approx 12 hours of SOB that started while she was in bed.  Took one nebulized albuterol treatment last night and one this morning w/out relief.  Per EMS O2 sat was 95% when they arrived and breath sound diminished.  She was not in any respiratory distress.  Pt reports that SOB has been associated w/ non-productive cough.  She is unsure of whether or not she has been wheezing or has had chest pain but has some mild right-sided chest discomfort currently.  No known fever.  Peripheral edema is at baseline and denies calf pain.  Patient lives alone.  She has home O2 that she uses on a prn basis.  Does not smoke cigarettes.  No personal or known FH of CAD.    Past Medical History  Diagnosis Date  . Asthma   . Hypertension     History reviewed. No pertinent past surgical history.  History reviewed. No pertinent family history.  History  Substance Use Topics  . Smoking status: Never Smoker   . Smokeless tobacco: Not on file  . Alcohol Use: No    OB History    Grav Para Term Preterm Abortions TAB SAB Ect Mult Living                  Review of Systems  All other systems reviewed and are negative.    Allergies  Review of patient's allergies indicates not on file.  Home Medications  No current outpatient prescriptions on file.  BP 150/81  Pulse 102  Temp 98.5 F (36.9 C)  Resp 22  SpO2 97%  Physical Exam  Nursing note and vitals reviewed. Constitutional: She is oriented to person, place, and time. She appears well-developed and well-nourished. No distress.  HENT:  Head: Normocephalic and atraumatic.  Eyes:       Normal  appearance  Neck: Normal range of motion.  Cardiovascular: Normal rate and regular rhythm.   Pulmonary/Chest: Effort normal and breath sounds normal. No respiratory distress. She has no wheezes.  Abdominal: Soft. Bowel sounds are normal. She exhibits no distension. There is no tenderness.       obese  Musculoskeletal:       No peripheral edema or calf tenderness  Neurological: She is alert and oriented to person, place, and time.  Skin: Skin is warm and dry. No rash noted.  Psychiatric: She has a normal mood and affect. Her behavior is normal.    ED Course  Procedures (including critical care time)   Date: 10/14/2011  Rate: 101  Rhythm: sinus tachycardia  QRS Axis: left  Intervals: normal  ST/T Wave abnormalities: normal  Conduction Disutrbances:none  Narrative Interpretation: LVH  Old EKG Reviewed: unchanged   Labs Reviewed  BASIC METABOLIC PANEL - Abnormal; Notable for the following:    Sodium 134 (*)    Potassium 2.8 (*)    Chloride 95 (*)    Glucose, Bld 106 (*)    GFR calc non Af Amer 89 (*)    All other components within normal limits  BLOOD GAS, ARTERIAL - Abnormal; Notable for the following:    pH, Arterial 7.450 (*)  pO2, Arterial 68.2 (*)    Bicarbonate 30.6 (*)    Acid-Base Excess 6.2 (*)    All other components within normal limits  BASIC METABOLIC PANEL - Abnormal; Notable for the following:    Potassium 2.8 (*)    Calcium 8.2 (*)    GFR calc non Af Amer 88 (*)    All other components within normal limits  BASIC METABOLIC PANEL - Abnormal; Notable for the following:    Potassium 3.2 (*)    Calcium 8.1 (*)    GFR calc non Af Amer 90 (*)    All other components within normal limits  CBC - Abnormal; Notable for the following:    RBC 3.69 (*)    Hemoglobin 11.4 (*)    HCT 34.5 (*)    All other components within normal limits  GLUCOSE, CAPILLARY - Abnormal; Notable for the following:    Glucose-Capillary 107 (*)    All other components within normal  limits  CBC  POCT I-STAT TROPONIN I  PHOSPHORUS  MAGNESIUM   Dg Chest 2 View  10/14/2011  *RADIOLOGY REPORT*  Clinical Data: Shortness of breath.  Generalized weakness.  Chest congestion.  History of asthma.  CHEST - 2 VIEW 10/14/2011:  Comparison: One-view chest x-ray 04/06/2009 and two-view chest x- ray 09/25/2007 [facility].  Findings: Cardiac silhouette upper normal in size to slightly enlarged for the AP technique, unchanged.  Hilar and mediastinal contours otherwise unremarkable.  Calcified granulomata suspected in the left mid and lower lung, unchanged.  Lungs otherwise clear. No pleural effusions.  Eventration of the right anterior hemidiaphragm.  Degenerative changes involving the thoracic spine. No significant interval change.  IMPRESSION: Stable borderline to mild cardiomegaly.  No acute cardiopulmonary disease.  Original Report Authenticated By: Arnell Sieving, M.D.     1. Asthma       MDM  Pt w/ h/o asthma and HTN presents w/ SOB, onset at rest last night.  Associated w/ non-productive cough that started yesterday.  EMS reports that pt was in no respiratory distress, O2 sat 95% on rm air but diminished breath sounds on their exam.  She has just completed another neb tmnt and nml breath sounds currently on my exam.  Afebrile and exam otherwise unremarkable.  RF for ACS include HTN and age but my clinical suspicion is low.  EKG, CXR and basic labs including troponin are pending.  60mg  prednisone ordered.  10:25 AM   Pt resting comfortably and VSS.  Labs sig for neg troponin and hypokalemia; potassium being supplemented currently.  EKG non-ischemic and CXR stable.  Will reassess patient and determine disposition shortly.  12:12 PM   Pt continues cough.  VSS w/ exception of O2 sat which has decreased to 89-91% on rm air at rest.  Dr. Rubin Payor has examined and recommends admission.  [facility] consulted and has accepted pt.  They would like a stat ABG.        Otilio Miu, [place] 10/15/11 1214

## 2011-10-14 NOTE — ED Notes (Signed)
Patient transported to X-ray 

## 2011-10-14 NOTE — H&P (Signed)
PCP:  No primary provider on file.   DOA:  10/14/2011  9:58 AM  Chief Complaint:  Shortness of breath  HPI: 71yo F w/ h/o HTN and asthma presents with progressively worsening shortness of breath that started morning of admission. Per EMS oxygen saturations upon their arrival were 93-95% and breath sounds were diminished on auscultation however, pt was not in respiratory distress. Pt reports that SOB has been associated with nonproductive cough and some wheezing. She also reports similar events in the past. She denies fevers and chills, no chest pain, no specific aggravating or alleviating factors, no abdominal or urinary concerns. Pt lives at home alone and uses oxygen PRN. She denies  Recent sicknesses or sick contacts.  Allergies: No Known Allergies  Prior to Admission medications   Medication Sig Start Date End Date Taking? Authorizing Provider  acetaminophen (TYLENOL) 325 MG tablet Take 650 mg by mouth every 6 (six) hours as needed. For pain   Yes Historical Provider, MD  alendronate (FOSAMAX) 70 MG tablet Take 70 mg by mouth every 7 (seven) days. On Thursdays. Take with a full glass of water on an empty stomach.   Yes Historical Provider, MD  B Complex-C (B-COMPLEX WITH VITAMIN C) tablet Take 1 tablet by mouth daily.   Yes Historical Provider, MD  hydrochlorothiazide (HYDRODIURIL) 25 MG tablet Take 25 mg by mouth daily.   Yes Historical Provider, MD    Past Medical History  Diagnosis Date  . Asthma   . Hypertension     History reviewed. No pertinent past surgical history.  Social History:  reports that she has never smoked. She does not have any smokeless tobacco history on file. She reports that she does not drink alcohol or use illicit drugs.  History reviewed. No pertinent family history.  Review of Systems:  Constitutional: Denies fever, chills, diaphoresis, appetite change and fatigue.  HEENT: Denies photophobia, eye pain, redness, hearing loss, ear pain, congestion,  sore throat, rhinorrhea, sneezing, mouth sores, trouble swallowing, neck pain, neck stiffness and tinnitus.   Respiratory: per HPI Cardiovascular: Denies chest pain, palpitations and leg swelling.  Gastrointestinal: Denies nausea, vomiting, abdominal pain, diarrhea, constipation, blood in stool and abdominal distention.  Genitourinary: Denies dysuria, urgency, frequency, hematuria, flank pain and difficulty urinating.  Musculoskeletal: Denies myalgias, back pain, joint swelling, arthralgias and gait problem.  Skin: Denies pallor, rash and wound.  Neurological: Denies dizziness, seizures, syncope, weakness, light-headedness, numbness and headaches.  Hematological: Denies adenopathy. Easy bruising, personal or family bleeding history  Psychiatric/Behavioral: Denies suicidal ideation, mood changes, confusion, nervousness, sleep disturbance and agitation   Physical Exam:  Filed Vitals:   10/14/11 1017 10/14/11 1153 10/14/11 1530 10/14/11 1534  BP: 150/81 138/70  113/66  Pulse: 102 90  91  Temp: 98.5 F (36.9 C) 98 F (36.7 C)  98.3 F (36.8 C)  TempSrc:    Oral  Resp: 22 20  18   SpO2: 97% 99% 94% 95%    Constitutional: Vital signs reviewed.  Patient is a well-developed and well-nourished in no acute distress and cooperative with exam. Alert and oriented x3.  Head: Normocephalic and atraumatic Ear: TM normal bilaterally Mouth: no erythema or exudates, MMM Eyes: PERRL, EOMI, conjunctivae normal, No scleral icterus.  Neck: Supple, Trachea midline normal ROM, No JVD, mass, thyromegaly, or carotid bruit present.  Cardiovascular: RRR, S1 normal, S2 normal, no MRG, pulses symmetric and intact bilaterally Pulmonary/Chest: CTAB, no wheezes, rales, or rhonchi Abdominal: Soft. Non-tender, non-distended, bowel sounds are normal, no masses, organomegaly,  or guarding present.  GU: no CVA tenderness Musculoskeletal: No joint deformities, erythema, or stiffness, ROM full and no nontender Ext: no  edema and no cyanosis, pulses palpable bilaterally (DP and PT) Hematology: no cervical, inginal, or axillary adenopathy.  Neurological: A&O x3, Strenght is normal and symmetric bilaterally, cranial nerve II-XII are grossly intact, no focal motor deficit, sensory intact to light touch bilaterally.  Skin: Warm, dry and intact. No rash, cyanosis, or clubbing.  Psychiatric: Normal mood and affect. speech and behavior is normal. Judgment and thought content normal. Cognition and memory are normal.   Labs on Admission:  Results for orders placed during the hospital encounter of 10/14/11 (from the past 48 hour(s))  CBC     Status: Normal   Collection Time   10/14/11 10:32 AM      Component Value Range Comment   WBC 7.7  4.0 - 10.5 (K/uL)    RBC 4.08  3.87 - 5.11 (MIL/uL)    Hemoglobin 12.5  12.0 - 15.0 (g/dL)    HCT 11.9  14.7 - 82.9 (%)    MCV 91.4  78.0 - 100.0 (fL)    MCH 30.6  26.0 - 34.0 (pg)    MCHC 33.5  30.0 - 36.0 (g/dL)    RDW 56.2  13.0 - 86.5 (%)    Platelets 243  150 - 400 (K/uL)   BASIC METABOLIC PANEL     Status: Abnormal   Collection Time   10/14/11 10:32 AM      Component Value Range Comment   Sodium 134 (*) 135 - 145 (mEq/L)    Potassium 2.8 (*) 3.5 - 5.1 (mEq/L)    Chloride 95 (*) 96 - 112 (mEq/L)    CO2 30  19 - 32 (mEq/L)    Glucose, Bld 106 (*) 70 - 99 (mg/dL)    BUN 9  6 - 23 (mg/dL)    Creatinine, Ser 7.84  0.50 - 1.10 (mg/dL)    Calcium 8.7  8.4 - 10.5 (mg/dL)    GFR calc non Af Amer 89 (*) >90 (mL/min)    GFR calc Af Amer >90  >90 (mL/min)   POCT I-STAT TROPONIN I     Status: Normal   Collection Time   10/14/11 10:45 AM      Component Value Range Comment   Troponin i, poc 0.00  0.00 - 0.08 (ng/mL)    Comment 3              Radiological Exams on Admission: CXR - no acute cardiopulmonary process, mild cardiomegaly but stable  Assessment/Plan  Principal Problem:  *Asthma exacerbation - will admit the patient to telemetry unit and monitor vitals per floor  protocol - ABG is pending at this time - CXR is not suggestive of other cardiopulmonary process causing the shortness of breath, will add zithromax empirically given subjective fevers and chills - will provide supportive care: oxygen, nebulizers PRN and scheduled, Prednisone with planned tapering as clinically indicated  Active Problems:  Hypokalemia - perhaps secondary to albuterol - already repleted in ED - will obtain follow up BMP and supplement as indicated - will check Mg level - BMP in AM   Hyponatremia - likely secondary to mild dehydration - will provide gently hydration   HTN (hypertension) - stable  Time Spent on Admission: Over 30 minutes  MAGICK-Ivo Moga 10/14/2011, 3:45 PM  [facility] [phone]

## 2011-10-15 DIAGNOSIS — J45909 Unspecified asthma, uncomplicated: Secondary | ICD-10-CM | POA: Diagnosis not present

## 2011-10-15 DIAGNOSIS — E871 Hypo-osmolality and hyponatremia: Secondary | ICD-10-CM | POA: Diagnosis not present

## 2011-10-15 DIAGNOSIS — I10 Essential (primary) hypertension: Secondary | ICD-10-CM | POA: Diagnosis not present

## 2011-10-15 DIAGNOSIS — E876 Hypokalemia: Secondary | ICD-10-CM | POA: Diagnosis not present

## 2011-10-15 LAB — BASIC METABOLIC PANEL
BUN: 8 mg/dL (ref 6–23)
CO2: 31 mEq/L (ref 19–32)
CO2: 28 mEq/L (ref 19–32)
Chloride: 102 mEq/L (ref 96–112)
Chloride: 99 mEq/L (ref 96–112)
Creatinine, Ser: 0.56 mg/dL (ref 0.50–1.10)
GFR calc Af Amer: >90 mL/min (ref >90)
Glucose, Bld: 96 mg/dL (ref 70–99)
Glucose, Bld: 188 mg/dL — ABNORMAL HIGH (ref 70–99)
Potassium: 3.2 mEq/L — ABNORMAL LOW (ref 3.5–5.1)
Potassium: 3.5 mEq/L (ref 3.5–5.1)
Sodium: 140 mEq/L (ref 135–145)

## 2011-10-15 LAB — CBC
HCT: 34.5 % — ABNORMAL LOW (ref 36.0–46.0)
HCT: 37.5 % (ref 36.0–46.0)
Hemoglobin: 11.4 g/dL — ABNORMAL LOW (ref 12.0–15.0)
Hemoglobin: 12.4 g/dL (ref 12.0–15.0)
MCH: 30.9 pg (ref 26.0–34.0)
MCHC: 33.1 g/dL (ref 30.0–36.0)
MCV: 93.5 fL (ref 78.0–100.0)
MCV: 93.3 fL (ref 78.0–100.0)
Platelets: 215 10*3/uL (ref 150–400)
RBC: 3.69 MIL/uL — ABNORMAL LOW (ref 3.87–5.11)
WBC: 6.1 10*3/uL (ref 4.0–10.5)

## 2011-10-15 MED ORDER — POTASSIUM CHLORIDE 10 MEQ/100ML IV SOLN
10.0000 meq | INTRAVENOUS | Status: DC
  Administered 2011-10-15: 10 meq via INTRAVENOUS
  Filled 2011-10-15 (×4): qty 100

## 2011-10-15 MED ORDER — POTASSIUM CHLORIDE CRYS ER 20 MEQ PO TBCR
40.0000 meq | EXTENDED_RELEASE_TABLET | Freq: Once | ORAL | Status: AC
  Administered 2011-10-15: 40 meq via ORAL
  Filled 2011-10-15: qty 2

## 2011-10-15 MED ORDER — ALBUTEROL SULFATE (5 MG/ML) 0.5% IN NEBU
2.5000 mg | INHALATION_SOLUTION | Freq: Three times a day (TID) | RESPIRATORY_TRACT | Status: DC
  Administered 2011-10-15 – 2011-10-16 (×5): 2.5 mg via RESPIRATORY_TRACT
  Filled 2011-10-15 (×6): qty 0.5

## 2011-10-15 MED ORDER — ALBUTEROL SULFATE (5 MG/ML) 0.5% IN NEBU: 2.5000 mg | INHALATION_SOLUTION | RESPIRATORY_TRACT | Status: DC | PRN

## 2011-10-15 NOTE — Progress Notes (Signed)
Subjective: Patient feels much better since admission.  Her breathing has improved.  Objective: Weight change:   Intake/Output Summary (Last 24 hours) at 10/15/11 1443 Last data filed at 10/15/11 0900  Gross per 24 hour  Intake    490 ml  Output      0 ml  Net    490 ml    Filed Vitals:   10/15/11 0530  BP: 132/82  Pulse: 82  Temp: 98 F (36.7 C)  Resp: 18   ZOX:WRUEAVWU her stated age, no increase work of breathing CV:RRR LUNGS:Some Rhonchi throughout JWJ:XBJYNWGN BS FAO:ZHYQM in the left that is chronic  Lab Results: Reviewed   Studies/Results: Dg Chest 2 View  10/14/2011  *RADIOLOGY REPORT*  Clinical Data: Shortness of breath.  Generalized weakness.  Chest congestion.  History of asthma.  CHEST - 2 VIEW 10/14/2011:  Comparison: One-view chest x-ray 04/06/2009 and two-view chest x- ray 09/25/2007 [facility].  Findings: Cardiac silhouette upper normal in size to slightly enlarged for the AP technique, unchanged.  Hilar and mediastinal contours otherwise unremarkable.  Calcified granulomata suspected in the left mid and lower lung, unchanged.  Lungs otherwise clear. No pleural effusions.  Eventration of the right anterior hemidiaphragm.  Degenerative changes involving the thoracic spine. No significant interval change.  IMPRESSION: Stable borderline to mild cardiomegaly.  No acute cardiopulmonary disease.  Original Report Authenticated By: Arnell Sieving, M.D.   Medications: Scheduled Meds:   . albuterol  2.5 mg Nebulization TID  . albuterol  5 mg Nebulization Once  . albuterol      . azithromycin  500 mg Intravenous Q24H  . enoxaparin  40 mg Subcutaneous QHS  . hydrochlorothiazide  25 mg Oral Daily  . ipratropium  0.5 mg Nebulization Once  . magnesium sulfate 1 - 4 g bolus IVPB  2 g Intravenous STAT  . potassium chloride  10 mEq Intravenous Once  . potassium chloride  40 mEq Oral Once  . potassium chloride  40 mEq Oral Once  . potassium chloride  40  mEq Oral Once  . predniSONE  50 mg Oral Q breakfast  . sodium chloride  3 mL Intravenous Q12H  . DISCONTD: albuterol  2.5 mg Nebulization Q4H  . DISCONTD: alendronate  70 mg Oral Q7 days  . DISCONTD: potassium chloride  10 mEq Intravenous Q1 Hr x 4   Continuous Infusions:   . sodium chloride 50 mL/hr at 10/14/11 2320   PRN Meds:.albuterol, HYDROcodone-acetaminophen, ondansetron (ZOFRAN) IV, ondansetron, DISCONTD: albuterol  Assessment/Plan: Asthma exacerbation (10/14/2011)  Continue nebs, steroid and antibiotics  Hypokalemia (10/14/2011)  Replete  Hyponatremia (10/14/2011)   Resolved  HTN (hypertension) (10/14/2011)   Blood pressure is stable    LOS: 1 day   Takayla Baillie JARRETT 10/15/2011, 2:43 PM

## 2011-10-15 NOTE — ED Provider Notes (Signed)
Medical screening examination/treatment/procedure(s) were performed by non-physician practitioner and as supervising physician I was immediately available for consultation/collaboration.  Jarelly Rinck R. Adarsh Mundorf, MD 10/15/11 1531 

## 2011-10-15 NOTE — Progress Notes (Signed)
CARE MANAGEMENT NOTE 10/15/2011  Patient:  Peggy Welch, Peggy Welch   Account Number:  [id number]  Date Initiated:  10/15/2011  Documentation initiated by:  Betheny Suchecki  Subjective/Objective Assessment:   admitted due to generalized weakness and sob, k+-2.3 on arrival     Action/Plan:   lives at home   Anticipated DC Date:  10/18/2011   Anticipated DC Plan:  HOME/SELF CARE  In-house referral  NA  NA      DC Planning Services  NA      [facility] Choice  NA   Choice offered to / List presented to:  NA   DME arranged  NA      DME agency  NA     HH arranged  NA      HH agency  NA   Status of service:  In process, will continue to follow Medicare Important Message given?  NA - LOS <3 / Initial given by admissions (If response is "NO", the following Medicare IM given date fields will be blank) Date Medicare IM given:   Date Additional Medicare IM given:    Discharge Disposition:    Per UR Regulation:  Reviewed for med. necessity/level of care/duration of stay  Comments:  [id number]/Rayhaan Huster,RN,BSN,CCM

## 2011-10-15 NOTE — Evaluation (Signed)
Physical Therapy Evaluation Patient Details Name: Peggy Welch MRN: [id number] DOB: Sep 20, 1940 Today's Date: 10/15/2011  Problem List:  Patient Active Problem List  Diagnoses  . Asthma exacerbation  . Hypokalemia  . Hyponatremia  . HTN (hypertension)    Past Medical History:  Past Medical History  Diagnosis Date  . Asthma   . Hypertension    Past Surgical History:  Past Surgical History  Procedure Date  . Joint replacement     Bilateral total knee replacements  . Cholecystectomy 02/21/2002  . Right rotator cuff repair 08/12/2006    PT Assessment/Plan/Recommendation PT Assessment Clinical Impression Statement: Pt presents with diagnosis of asthma exacerbation. Pt will benefit from skilled PT in acute care setting to maximize independence and activty tolerance with mobility in preparation for D/C home.  PT Recommendation/Assessment: Patient will need skilled PT in the acute care venue PT Problem List: Decreased activity tolerance PT Therapy Diagnosis : Generalized weakness PT Plan PT Frequency: Min 3X/week PT Treatment/Interventions: Gait training;Stair training;Functional mobility training;Therapeutic activities;Patient/family education PT Recommendation Follow Up Recommendations: No PT follow up Equipment Recommended: None recommended by PT PT Goals  Acute Rehab PT Goals PT Goal Formulation: With patient Time For Goal Achievement: 7 days Pt will Ambulate: >150 feet;with modified independence PT Goal: Ambulate - Progress: Goal set today Pt will Go Up / Down Stairs: 3-5 stairs;with rail(s) (4 steps) PT Goal: Up/Down Stairs - Progress: Goal set today  PT Evaluation Precautions/Restrictions    Prior Functioning  Home Living Lives With: Other (Comment) (granddaughter) Receives Help From: Family Type of Home: Other (Comment) (duplex) Home Layout: One level Home Access: Stairs to enter Entrance Stairs-Rails: Right Entrance Stairs-Number of Steps: 4 Home  Adaptive Equipment: Straight cane Prior Function Level of Independence: Independent with basic ADLs;Independent with gait;Independent with homemaking with ambulation Driving: Yes Cognition Cognition Arousal/Alertness: Awake/alert Overall Cognitive Status: Appears within functional limits for tasks assessed Sensation/Coordination Sensation Light Touch: Appears Intact Coordination Gross Motor Movements are Fluid and Coordinated: Yes Extremity Assessment RLE Assessment RLE Assessment: Within Functional Limits LLE Assessment LLE Assessment: Within Functional Limits Mobility (including Balance) Bed Mobility Bed Mobility: Yes Supine to Sit: 7: Independent Transfers Transfers: Yes Sit to Stand: 7: Independent Stand to Sit: 7: Independent Ambulation/Gait Ambulation/Gait: Yes Ambulation/Gait Assistance Details (indicate cue type and reason): Supervision assist. Slow gait speed. some wheezing noted with activity but pt able to participate well . Ambulation Distance (Feet): 200 Feet Assistive device: None Gait Pattern: Step-through pattern;Decreased step length - left;Decreased step length - right;Decreased stride length  Posture/Postural Control Posture/Postural Control: No significant limitations Dynamic Standing Balance Dynamic Standing - Comments: Pt performed sponge batth and changed underwear while standing-required 1 hand support on sink. No LOB. No LOB during ambulation Exercise    End of Session PT - End of Session Equipment Utilized During Treatment: Gait belt Activity Tolerance: Patient tolerated treatment well Patient left: in chair;with call bell in reach General Behavior During Session: [facility] for tasks performed Cognition: [place] for tasks performed  Rebeca Alert [facility] 10/15/2011, 9:13 AM [phone]

## 2011-10-16 DIAGNOSIS — E876 Hypokalemia: Secondary | ICD-10-CM | POA: Diagnosis not present

## 2011-10-16 DIAGNOSIS — E871 Hypo-osmolality and hyponatremia: Secondary | ICD-10-CM | POA: Diagnosis not present

## 2011-10-16 DIAGNOSIS — I10 Essential (primary) hypertension: Secondary | ICD-10-CM | POA: Diagnosis not present

## 2011-10-16 DIAGNOSIS — J45909 Unspecified asthma, uncomplicated: Secondary | ICD-10-CM | POA: Diagnosis not present

## 2011-10-16 MED ORDER — AZITHROMYCIN 500 MG PO TABS: 500.0000 mg | ORAL_TABLET | Freq: Every day | ORAL | Status: AC

## 2011-10-16 MED ORDER — ALBUTEROL SULFATE (5 MG/ML) 0.5% IN NEBU
2.5000 mg | INHALATION_SOLUTION | Freq: Three times a day (TID) | RESPIRATORY_TRACT | Status: DC
Start: 2011-10-16 — End: 2012-10-15

## 2011-10-16 MED ORDER — PREDNISONE 10 MG PO TABS: ORAL_TABLET | ORAL | Status: AC

## 2011-10-16 MED ORDER — AZITHROMYCIN 500 MG PO TABS
500.0000 mg | ORAL_TABLET | Freq: Every day | ORAL | Status: DC
  Administered 2011-10-16: 500 mg via ORAL
  Filled 2011-10-16 (×2): qty 1

## 2011-10-16 NOTE — Discharge Summary (Signed)
DISCHARGE SUMMARY  Peggy Welch  MR#: [id number]  DOB:[birth year]  Date of Admission: 10/14/2011 Date of Discharge: 10/16/2011  Attending Physician:Chari Parmenter  Patient's PCP:No primary provider on file.  Consults: None  Discharge Diagnoses: Present on Admission:  .Asthma exacerbation .Hypokalemia .Hyponatremia .HTN (hypertension)    Medication List  As of 10/16/2011  2:40 PM   TAKE these medications         acetaminophen 325 MG tablet   Commonly known as: TYLENOL   Take 650 mg by mouth every 6 (six) hours as needed. For pain      albuterol (5 MG/ML) 0.5% nebulizer solution   Commonly known as: PROVENTIL   Take 0.5 mLs (2.5 mg total) by nebulization 3 (three) times daily.      alendronate 70 MG tablet   Commonly known as: FOSAMAX   Take 70 mg by mouth every 7 (seven) days. On Thursdays. Take with a full glass of water on an empty stomach.      azithromycin 500 MG tablet   Commonly known as: ZITHROMAX   Take 1 tablet (500 mg total) by mouth daily.      B-complex with vitamin C tablet   Take 1 tablet by mouth daily.      hydrochlorothiazide 25 MG tablet   Commonly known as: HYDRODIURIL   Take 25 mg by mouth daily.      predniSONE 10 MG tablet   Commonly known as: DELTASONE   Prednisone 50 mg for 1 more day followed by  Prednisone 40mg   For 3 days followed by   Prednisone 30mg  for 3 days followed by   Prednisone 20mg  for 3 days followed by  Prednisone 10mg  for 3 days.           Brief Admit Note:   71yo F w/ h/o HTN and asthma presents with progressively worsening shortness of breath that started morning of admission. Per EMS oxygen saturations upon their arrival were 93-95% and breath sounds were diminished on auscultation and she was admitted for evaluation of asthma exacerbation.    Hospital Course:  *Asthma exacerbation  She was initially admitted to  telemetry unit and monitored vitals per floor protocol  - CXR was not suggestive of other  cardiopulmonary process causing the shortness of breath,. She was started on prednisone and zithromax and her sob has significantly improved and she is being discharged on prednisone taper and po zithromax.  Active Problems:  Hypokalemia  - perhaps secondary to albuterol  And was appropriately repleted.  Hyponatremia  - likely secondary to mild dehydration  -resolved with hydration.  HTN (hypertension)  - stable   Day of Discharge BP 145/81  Pulse 78  Temp(Src) 97.9 F (36.6 C) (Oral)  Resp 16  Ht 5\' 1"  (1.549 m)  Wt 91.853 kg (202 lb 8 oz)  BMI 38.26 kg/m2  SpO2 97%  Physical Exam: WUJ:WJXBJYNW her stated age, no increase work of breathing CV:RRR LUNGS:Some Rhonchi throughout  GNF:AOZHYQMV BS HQI:ONGEX in the left that is chronic   Results for orders placed during the hospital encounter of 10/14/11 (from the past 24 hour(s))  BASIC METABOLIC PANEL     Status: Abnormal   Collection Time   10/15/11  4:15 PM      Component Value Range   Sodium 135  135 - 145 (mEq/L)   Potassium 3.5  3.5 - 5.1 (mEq/L)   Chloride 99  96 - 112 (mEq/L)   CO2 28  19 - 32 (mEq/L)   Glucose,  Bld 188 (*) 70 - 99 (mg/dL)   BUN 8  6 - 23 (mg/dL)   Creatinine, Ser 4.78  0.50 - 1.10 (mg/dL)   Calcium 8.8  8.4 - 29.5 (mg/dL)   GFR calc non Af Amer >90  >90 (mL/min)   GFR calc Af Amer >90  >90 (mL/min)  CBC     Status: Normal   Collection Time   10/15/11  4:15 PM      Component Value Range   WBC 5.2  4.0 - 10.5 (K/uL)   RBC 4.02  3.87 - 5.11 (MIL/uL)   Hemoglobin 12.4  12.0 - 15.0 (g/dL)   HCT 62.1  30.8 - 65.7 (%)   MCV 93.3  78.0 - 100.0 (fL)   MCH 30.8  26.0 - 34.0 (pg)   MCHC 33.1  30.0 - 36.0 (g/dL)   RDW 84.6  96.2 - 95.2 (%)   Platelets 244  150 - 400 (K/uL)  GLUCOSE, CAPILLARY     Status: Abnormal   Collection Time   10/16/11  8:09 AM      Component Value Range   Glucose-Capillary 100 (*) 70 - 99 (mg/dL)    Disposition: Home   Follow-up Appts: Discharge Orders    Future  Orders Please Complete By Expires   Diet - low sodium heart healthy      Discharge instructions      Comments:   Follow up with pcp in one to two weeks.   Activity as tolerated - No restrictions             Time spent in discharge (includes decision making & examination of pt): 40 minutes  Signed: Roisin Mones 10/16/2011, 2:40 PM

## 2011-10-16 NOTE — Progress Notes (Signed)
PHARMACIST - PHYSICIAN COMMUNICATION DR:   [facility] CONCERNING: Antibiotic IV to Oral Route Change Policy  RECOMMENDATION: This patient is receiving Zithromax by the intravenous route.  Based on criteria approved by the Pharmacy and Therapeutics Committee, the antibiotic(s) is/are being converted to the equivalent oral dose form(s).   DESCRIPTION: These criteria include:  Patient being treated for a respiratory tract infection, urinary tract infection, or cellulitis  The patient is not neutropenic and does not exhibit a GI malabsorption state  The patient is eating (either orally or via tube) and/or has been taking other orally administered medications for a least 24 hours  The patient is improving clinically and has a Tmax < 100.5  If you have questions about this conversion, please contact the Pharmacy Department  []   [phone] )  [facility] []   [phone] )  [facility]  []   [phone] )  [facility] [x]   [phone] )  [facility]    Hessie Knows, PharmD, BCPS pager [phone] 10/16/2011 11:02 AM

## 2012-01-13 DIAGNOSIS — M169 Osteoarthritis of hip, unspecified: Secondary | ICD-10-CM | POA: Diagnosis not present

## 2012-01-13 DIAGNOSIS — M25559 Pain in unspecified hip: Secondary | ICD-10-CM | POA: Diagnosis not present

## 2012-02-14 ENCOUNTER — Encounter (HOSPITAL_COMMUNITY): Payer: Self-pay | Admitting: *Deleted

## 2012-02-14 ENCOUNTER — Emergency Department (HOSPITAL_COMMUNITY)
Admission: EM | Admit: 2012-02-14 | Discharge: 2012-02-14 | Disposition: A | Discharge status: Home / Self Care | Payer: Medicare Other | Attending: Emergency Medicine | Admitting: Emergency Medicine

## 2012-02-14 DIAGNOSIS — I10 Essential (primary) hypertension: Secondary | ICD-10-CM | POA: Insufficient documentation

## 2012-02-14 DIAGNOSIS — L03119 Cellulitis of unspecified part of limb: Secondary | ICD-10-CM | POA: Diagnosis not present

## 2012-02-14 DIAGNOSIS — L539 Erythematous condition, unspecified: Secondary | ICD-10-CM | POA: Insufficient documentation

## 2012-02-14 DIAGNOSIS — J45909 Unspecified asthma, uncomplicated: Secondary | ICD-10-CM | POA: Diagnosis not present

## 2012-02-14 DIAGNOSIS — Z79899 Other long term (current) drug therapy: Secondary | ICD-10-CM | POA: Insufficient documentation

## 2012-02-14 DIAGNOSIS — W57XXXA Bitten or stung by nonvenomous insect and other nonvenomous arthropods, initial encounter: Secondary | ICD-10-CM | POA: Diagnosis not present

## 2012-02-14 DIAGNOSIS — L02419 Cutaneous abscess of limb, unspecified: Secondary | ICD-10-CM | POA: Diagnosis not present

## 2012-02-14 DIAGNOSIS — M25559 Pain in unspecified hip: Secondary | ICD-10-CM | POA: Insufficient documentation

## 2012-02-14 DIAGNOSIS — L0390 Cellulitis, unspecified: Secondary | ICD-10-CM

## 2012-02-14 DIAGNOSIS — S90569A Insect bite (nonvenomous), unspecified ankle, initial encounter: Secondary | ICD-10-CM | POA: Diagnosis not present

## 2012-02-14 HISTORY — DX: Unspecified viral hepatitis C without hepatic coma: B19.20

## 2012-02-14 MED ORDER — SULFAMETHOXAZOLE-TRIMETHOPRIM 800-160 MG PO TABS: 1.0000 | ORAL_TABLET | Freq: Two times a day (BID) | ORAL | Status: AC

## 2012-02-14 NOTE — ED Notes (Signed)
Pt states she has had right hip pain x2 days and noticed a small areas of redness and localized swelling on right hip 2 days ago.  Pt denies itching to the area.  Pt does have small red area to right hip that has a shallow opening in center.  Pt denies drainage.  Pt denies fever, N/V.  Pt has been treating with topical abx ointment.

## 2012-02-14 NOTE — ED Provider Notes (Signed)
History     CSN: [id number]  Arrival date & time 02/14/12  1005   First MD Initiated Contact with Patient 02/14/12 1055      Chief Complaint  Patient presents with  . Insect Bite    ?  Marland Kitchen Hip Pain    right    (Consider location/radiation/quality/duration/timing/severity/associated sxs/prior treatment) Patient is a 71 y.o. female presenting with hip pain. The history is provided by the patient. No language interpreter was used.  Hip Pain This is a new problem. The current episode started in the past 7 days. Pertinent negatives include no fever, nausea or vomiting.   Bug bite to R hip x 2 days.  Erythema 2cm in size. Patient is not diabetic. Has been putting antibiotic ointment on the area for 2 days. Past Medical History  Diagnosis Date  . Asthma   . Hypertension   . 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 Joint replacement     Bilateral total knee replacements  . Cholecystectomy 02/21/2002  . Right rotator cuff repair 08/12/2006    No family history on file.  History  Substance Use Topics  . Smoking status: Never Smoker   . Smokeless tobacco: Never Used  . Alcohol Use: No    OB History    Grav Para Term Preterm Abortions TAB SAB Ect Mult Living                  Review of Systems  Constitutional: Negative.  Negative for fever.  HENT: Negative.   Eyes: Negative.   Respiratory: Negative.   Cardiovascular: Negative.   Gastrointestinal: Negative.  Negative for nausea and vomiting.  Skin:       Right hip bug bite  Neurological: Negative.   Psychiatric/Behavioral: Negative.   All other systems reviewed and are negative.    Allergies  Review of patient's allergies indicates no known allergies.  Home Medications   Current Outpatient Rx  Name Route Sig Dispense Refill  . ACETAMINOPHEN 325 MG PO TABS Oral Take 650 mg by mouth every 6 (six) hours as needed. For pain    . ALBUTEROL SULFATE (5 MG/ML) 0.5% IN NEBU   2.5 mg every 4 (four) hours as needed. For shortness of breath    . ALENDRONATE SODIUM 70 MG PO TABS Oral Take 70 mg by mouth every 7 (seven) days. On Thursdays. Take with a full glass of water on an empty stomach.    . B COMPLEX-C PO TABS Oral Take 1 tablet by mouth daily.    Marland Kitchen HYDROCHLOROTHIAZIDE 25 MG PO TABS Oral Take 25 mg by mouth daily.    Marland Kitchen HYDROCODONE-ACETAMINOPHEN 5-325 MG PO TABS Oral Take 1 tablet by mouth at bedtime as needed. For pain/inflammation      BP 149/94  Pulse 83  Temp 98.7 F (37.1 C) (Oral)  Resp 18  SpO2 97%  Physical Exam  Nursing note and vitals reviewed. Constitutional: She is oriented to person, place, and time. She appears well-developed and well-nourished.  HENT:  Head: Normocephalic and atraumatic.  Eyes: Conjunctivae and EOM are normal. Pupils are equal, round, and reactive to light.  Neck: Normal range of motion. Neck supple.  Cardiovascular: Normal rate, regular rhythm, normal heart sounds and intact distal pulses.  Exam reveals no gallop and no friction rub.   No murmur heard. Pulmonary/Chest: Effort normal and breath sounds normal.  Abdominal: Soft. Bowel sounds are normal.  Musculoskeletal:  Normal range of motion. She exhibits no edema and no tenderness.  Neurological: She is alert and oriented to person, place, and time. She has normal reflexes.  Skin: Skin is warm and dry.       Right hip 3 cm erythema  Psychiatric: She has a normal mood and affect.    ED Course  Procedures (including critical care time)  Labs Reviewed - No data to display No results found.   No diagnosis found.    MDM  3 cm area of erythema to the right hip. Bactrim DS prescription followup with PCP or return here if worse.        Remi Haggard, NP 02/14/12 1123

## 2012-02-14 NOTE — ED Provider Notes (Signed)
Medical screening examination/treatment/procedure(s) were performed by non-physician practitioner and as supervising physician I was immediately available for consultation/collaboration.  Chellsie Gomer R. Charmon Thorson, MD 02/14/12 1624 

## 2012-02-14 NOTE — Discharge Instructions (Signed)
Ms Spare take the antibiotic as directed.  Follow up with Dr. Parke Simmers or return here in the next 2-3 days for a recheck. Return sooner if worse. Cellulitis Cellulitis is an infection of the tissue under the skin. The infected area is usually red and tender. This is caused by germs. These germs enter the body through cuts or sores. This usually happens in the arms or lower legs. HOME CARE   Take your medicine as told. Finish it even if you start to feel better.   If the infection is on the arm or leg, keep it raised (elevated).   Use a warm cloth on the infected area several times a day.   See your doctor for a follow-up visit as told.  GET HELP RIGHT AWAY IF:   You are tired or confused.   You throw up (vomit).   You have watery poop (diarrhea).   You feel ill and have muscle aches.   You have a fever.  MAKE SURE YOU:   Understand these instructions.   Will watch your condition.   Will get help right away if you are not doing well or get worse.  Document Released: 01/28/2008 Document Revised: 07/31/2011 Document Reviewed: 07/13/2009 [facility] Patient Information 2012 [place], [place].

## 2012-02-19 DIAGNOSIS — L0291 Cutaneous abscess, unspecified: Secondary | ICD-10-CM | POA: Diagnosis not present

## 2012-02-19 DIAGNOSIS — L0390 Cellulitis, unspecified: Secondary | ICD-10-CM | POA: Diagnosis not present

## 2012-02-20 DIAGNOSIS — W57XXXA Bitten or stung by nonvenomous insect and other nonvenomous arthropods, initial encounter: Secondary | ICD-10-CM | POA: Diagnosis not present

## 2012-02-23 DIAGNOSIS — L0291 Cutaneous abscess, unspecified: Secondary | ICD-10-CM | POA: Diagnosis not present

## 2012-03-01 DIAGNOSIS — L0390 Cellulitis, unspecified: Secondary | ICD-10-CM | POA: Diagnosis not present

## 2012-03-29 DIAGNOSIS — L0390 Cellulitis, unspecified: Secondary | ICD-10-CM | POA: Diagnosis not present

## 2012-03-29 DIAGNOSIS — L0291 Cutaneous abscess, unspecified: Secondary | ICD-10-CM | POA: Diagnosis not present

## 2012-06-28 DIAGNOSIS — B1920 Unspecified viral hepatitis C without hepatic coma: Secondary | ICD-10-CM | POA: Diagnosis not present

## 2012-06-28 DIAGNOSIS — J449 Chronic obstructive pulmonary disease, unspecified: Secondary | ICD-10-CM | POA: Diagnosis not present

## 2012-06-28 DIAGNOSIS — E7800 Pure hypercholesterolemia, unspecified: Secondary | ICD-10-CM | POA: Diagnosis not present

## 2012-06-28 DIAGNOSIS — H902 Conductive hearing loss, unspecified: Secondary | ICD-10-CM | POA: Diagnosis not present

## 2012-06-28 DIAGNOSIS — M810 Age-related osteoporosis without current pathological fracture: Secondary | ICD-10-CM | POA: Diagnosis not present

## 2012-06-28 DIAGNOSIS — I10 Essential (primary) hypertension: Secondary | ICD-10-CM | POA: Diagnosis not present

## 2012-09-15 ENCOUNTER — Other Ambulatory Visit: Payer: Self-pay | Admitting: Family Medicine

## 2012-09-15 DIAGNOSIS — Z1231 Encounter for screening mammogram for malignant neoplasm of breast: Secondary | ICD-10-CM

## 2012-10-14 ENCOUNTER — Ambulatory Visit
Admission: RE | Admit: 2012-10-14 | Discharge: 2012-10-14 | Disposition: A | Discharge status: Home / Self Care | Payer: Medicare Other | Source: Ambulatory Visit | Attending: Family Medicine | Admitting: Family Medicine

## 2012-10-14 DIAGNOSIS — Z1231 Encounter for screening mammogram for malignant neoplasm of breast: Secondary | ICD-10-CM | POA: Diagnosis not present

## 2012-10-21 DIAGNOSIS — B1920 Unspecified viral hepatitis C without hepatic coma: Secondary | ICD-10-CM | POA: Diagnosis not present

## 2012-10-21 DIAGNOSIS — I10 Essential (primary) hypertension: Secondary | ICD-10-CM | POA: Diagnosis not present

## 2012-10-21 DIAGNOSIS — M1990 Unspecified osteoarthritis, unspecified site: Secondary | ICD-10-CM | POA: Diagnosis not present

## 2012-10-21 DIAGNOSIS — E119 Type 2 diabetes mellitus without complications: Secondary | ICD-10-CM | POA: Diagnosis not present

## 2012-10-21 DIAGNOSIS — E669 Obesity, unspecified: Secondary | ICD-10-CM | POA: Diagnosis not present

## 2012-10-21 DIAGNOSIS — E7800 Pure hypercholesterolemia, unspecified: Secondary | ICD-10-CM | POA: Diagnosis not present

## 2012-12-24 DIAGNOSIS — H25099 Other age-related incipient cataract, unspecified eye: Secondary | ICD-10-CM | POA: Diagnosis not present

## 2013-02-09 DIAGNOSIS — I10 Essential (primary) hypertension: Secondary | ICD-10-CM | POA: Diagnosis not present

## 2013-02-09 DIAGNOSIS — E781 Pure hyperglyceridemia: Secondary | ICD-10-CM | POA: Diagnosis not present

## 2013-02-09 DIAGNOSIS — D102 Benign neoplasm of floor of mouth: Secondary | ICD-10-CM | POA: Diagnosis not present

## 2013-02-18 DIAGNOSIS — D102 Benign neoplasm of floor of mouth: Secondary | ICD-10-CM | POA: Diagnosis not present

## 2013-03-15 DIAGNOSIS — M79609 Pain in unspecified limb: Secondary | ICD-10-CM | POA: Diagnosis not present

## 2013-03-15 DIAGNOSIS — D485 Neoplasm of uncertain behavior of skin: Secondary | ICD-10-CM | POA: Diagnosis not present

## 2013-03-15 DIAGNOSIS — B078 Other viral warts: Secondary | ICD-10-CM | POA: Diagnosis not present

## 2013-03-15 DIAGNOSIS — D2370 Other benign neoplasm of skin of unspecified lower limb, including hip: Secondary | ICD-10-CM | POA: Diagnosis not present

## 2013-03-15 DIAGNOSIS — M2010 Hallux valgus (acquired), unspecified foot: Secondary | ICD-10-CM | POA: Diagnosis not present

## 2013-04-05 DIAGNOSIS — I10 Essential (primary) hypertension: Secondary | ICD-10-CM | POA: Diagnosis not present

## 2013-06-09 ENCOUNTER — Ambulatory Visit
Admission: RE | Admit: 2013-06-09 | Discharge: 2013-06-09 | Disposition: A | Discharge status: Home / Self Care | Payer: Medicare Other | Source: Ambulatory Visit | Attending: Family Medicine | Admitting: Family Medicine

## 2013-06-09 ENCOUNTER — Other Ambulatory Visit: Payer: Self-pay | Admitting: Family Medicine

## 2013-06-09 DIAGNOSIS — M79671 Pain in right foot: Secondary | ICD-10-CM

## 2013-06-09 DIAGNOSIS — I10 Essential (primary) hypertension: Secondary | ICD-10-CM | POA: Diagnosis not present

## 2013-06-09 DIAGNOSIS — M19079 Primary osteoarthritis, unspecified ankle and foot: Secondary | ICD-10-CM | POA: Diagnosis not present

## 2013-06-09 DIAGNOSIS — M25579 Pain in unspecified ankle and joints of unspecified foot: Secondary | ICD-10-CM | POA: Diagnosis not present

## 2013-06-21 DIAGNOSIS — M1250 Traumatic arthropathy, unspecified site: Secondary | ICD-10-CM | POA: Diagnosis not present

## 2013-06-21 DIAGNOSIS — I10 Essential (primary) hypertension: Secondary | ICD-10-CM | POA: Diagnosis not present

## 2013-08-05 ENCOUNTER — Encounter (HOSPITAL_COMMUNITY): Payer: Self-pay | Admitting: Emergency Medicine

## 2013-08-05 ENCOUNTER — Emergency Department (HOSPITAL_COMMUNITY): Payer: Medicare Other

## 2013-08-05 ENCOUNTER — Emergency Department (HOSPITAL_COMMUNITY)
Admission: EM | Admit: 2013-08-05 | Discharge: 2013-08-05 | Disposition: A | Discharge status: Home / Self Care | Payer: Medicare Other | Attending: Emergency Medicine | Admitting: Emergency Medicine

## 2013-08-05 DIAGNOSIS — S0993XA Unspecified injury of face, initial encounter: Secondary | ICD-10-CM | POA: Insufficient documentation

## 2013-08-05 DIAGNOSIS — Z8619 Personal history of other infectious and parasitic diseases: Secondary | ICD-10-CM | POA: Insufficient documentation

## 2013-08-05 DIAGNOSIS — M542 Cervicalgia: Secondary | ICD-10-CM | POA: Diagnosis not present

## 2013-08-05 DIAGNOSIS — Y92410 Unspecified street and highway as the place of occurrence of the external cause: Secondary | ICD-10-CM | POA: Insufficient documentation

## 2013-08-05 DIAGNOSIS — Z79899 Other long term (current) drug therapy: Secondary | ICD-10-CM | POA: Insufficient documentation

## 2013-08-05 DIAGNOSIS — J45909 Unspecified asthma, uncomplicated: Secondary | ICD-10-CM | POA: Insufficient documentation

## 2013-08-05 DIAGNOSIS — Y9389 Activity, other specified: Secondary | ICD-10-CM | POA: Insufficient documentation

## 2013-08-05 DIAGNOSIS — S46909A Unspecified injury of unspecified muscle, fascia and tendon at shoulder and upper arm level, unspecified arm, initial encounter: Secondary | ICD-10-CM | POA: Insufficient documentation

## 2013-08-05 DIAGNOSIS — S4980XA Other specified injuries of shoulder and upper arm, unspecified arm, initial encounter: Secondary | ICD-10-CM | POA: Insufficient documentation

## 2013-08-05 DIAGNOSIS — I10 Essential (primary) hypertension: Secondary | ICD-10-CM | POA: Insufficient documentation

## 2013-08-05 MED ORDER — ACETAMINOPHEN 325 MG PO TABS
650.0000 mg | ORAL_TABLET | Freq: Once | ORAL | Status: AC
  Administered 2013-08-05: 650 mg via ORAL
  Filled 2013-08-05: qty 2
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
## 2013-09-23 DIAGNOSIS — M1250 Traumatic arthropathy, unspecified site: Secondary | ICD-10-CM | POA: Diagnosis not present

## 2013-09-23 DIAGNOSIS — I10 Essential (primary) hypertension: Secondary | ICD-10-CM | POA: Diagnosis not present

## 2013-10-21 DIAGNOSIS — M25559 Pain in unspecified hip: Secondary | ICD-10-CM | POA: Diagnosis not present

## 2013-10-21 DIAGNOSIS — M25569 Pain in unspecified knee: Secondary | ICD-10-CM | POA: Diagnosis not present

## 2013-11-28 ENCOUNTER — Other Ambulatory Visit: Payer: Self-pay

## 2013-11-28 DIAGNOSIS — Z1231 Encounter for screening mammogram for malignant neoplasm of breast: Secondary | ICD-10-CM

## 2013-12-08 ENCOUNTER — Ambulatory Visit: Payer: Medicare Other

## 2014-01-30 DIAGNOSIS — H25019 Cortical age-related cataract, unspecified eye: Secondary | ICD-10-CM | POA: Diagnosis not present

## 2014-01-30 DIAGNOSIS — H2510 Age-related nuclear cataract, unspecified eye: Secondary | ICD-10-CM | POA: Diagnosis not present

## 2014-04-28 DIAGNOSIS — I10 Essential (primary) hypertension: Secondary | ICD-10-CM | POA: Diagnosis not present

## 2014-04-28 DIAGNOSIS — E119 Type 2 diabetes mellitus without complications: Secondary | ICD-10-CM | POA: Diagnosis not present

## 2014-05-02 DIAGNOSIS — M1610 Unilateral primary osteoarthritis, unspecified hip: Secondary | ICD-10-CM | POA: Diagnosis not present

## 2014-05-02 DIAGNOSIS — M542 Cervicalgia: Secondary | ICD-10-CM | POA: Diagnosis not present

## 2014-05-02 DIAGNOSIS — M169 Osteoarthritis of hip, unspecified: Secondary | ICD-10-CM | POA: Diagnosis not present

## 2014-05-02 DIAGNOSIS — M25519 Pain in unspecified shoulder: Secondary | ICD-10-CM | POA: Diagnosis not present

## 2014-05-22 DIAGNOSIS — M169 Osteoarthritis of hip, unspecified: Secondary | ICD-10-CM | POA: Diagnosis not present

## 2014-05-22 DIAGNOSIS — M1610 Unilateral primary osteoarthritis, unspecified hip: Secondary | ICD-10-CM | POA: Diagnosis not present

## 2014-05-22 DIAGNOSIS — M25559 Pain in unspecified hip: Secondary | ICD-10-CM | POA: Diagnosis not present

## 2014-05-30 DIAGNOSIS — M169 Osteoarthritis of hip, unspecified: Secondary | ICD-10-CM | POA: Diagnosis not present

## 2014-05-30 DIAGNOSIS — I10 Essential (primary) hypertension: Secondary | ICD-10-CM | POA: Diagnosis not present

## 2014-05-30 DIAGNOSIS — Z23 Encounter for immunization: Secondary | ICD-10-CM | POA: Diagnosis not present

## 2014-05-30 DIAGNOSIS — B182 Chronic viral hepatitis C: Secondary | ICD-10-CM | POA: Diagnosis not present

## 2014-06-02 DIAGNOSIS — M1611 Unilateral primary osteoarthritis, right hip: Secondary | ICD-10-CM | POA: Diagnosis not present

## 2014-06-02 DIAGNOSIS — M25551 Pain in right hip: Secondary | ICD-10-CM | POA: Diagnosis not present

## 2014-09-01 DIAGNOSIS — M1611 Unilateral primary osteoarthritis, right hip: Secondary | ICD-10-CM | POA: Diagnosis not present

## 2014-09-01 DIAGNOSIS — M25551 Pain in right hip: Secondary | ICD-10-CM | POA: Diagnosis not present

## 2014-09-08 DIAGNOSIS — E0800 Diabetes mellitus due to underlying condition with hyperosmolarity without nonketotic hyperglycemic-hyperosmolar coma (NKHHC): Secondary | ICD-10-CM | POA: Diagnosis not present

## 2014-09-08 DIAGNOSIS — M62838 Other muscle spasm: Secondary | ICD-10-CM | POA: Diagnosis not present

## 2014-09-08 DIAGNOSIS — E782 Mixed hyperlipidemia: Secondary | ICD-10-CM | POA: Diagnosis not present

## 2014-09-08 DIAGNOSIS — I10 Essential (primary) hypertension: Secondary | ICD-10-CM | POA: Diagnosis not present

## 2014-09-09 ENCOUNTER — Encounter (HOSPITAL_COMMUNITY): Payer: Self-pay | Admitting: Emergency Medicine

## 2014-09-09 ENCOUNTER — Emergency Department (HOSPITAL_COMMUNITY): Payer: Medicare Other

## 2014-09-09 ENCOUNTER — Emergency Department (HOSPITAL_COMMUNITY)
Admission: EM | Admit: 2014-09-09 | Discharge: 2014-09-09 | Disposition: A | Discharge status: Home / Self Care | Payer: Medicare Other | Attending: Emergency Medicine | Admitting: Emergency Medicine

## 2014-09-09 DIAGNOSIS — Z7982 Long term (current) use of aspirin: Secondary | ICD-10-CM | POA: Diagnosis not present

## 2014-09-09 DIAGNOSIS — M542 Cervicalgia: Secondary | ICD-10-CM | POA: Diagnosis not present

## 2014-09-09 DIAGNOSIS — J45909 Unspecified asthma, uncomplicated: Secondary | ICD-10-CM | POA: Insufficient documentation

## 2014-09-09 DIAGNOSIS — Z79899 Other long term (current) drug therapy: Secondary | ICD-10-CM | POA: Insufficient documentation

## 2014-09-09 DIAGNOSIS — Z8619 Personal history of other infectious and parasitic diseases: Secondary | ICD-10-CM | POA: Insufficient documentation

## 2014-09-09 DIAGNOSIS — M5030 Other cervical disc degeneration, unspecified cervical region: Secondary | ICD-10-CM | POA: Diagnosis not present

## 2014-09-09 DIAGNOSIS — I10 Essential (primary) hypertension: Secondary | ICD-10-CM | POA: Insufficient documentation

## 2014-09-09 DIAGNOSIS — M791 Myalgia: Secondary | ICD-10-CM | POA: Insufficient documentation

## 2014-09-09 DIAGNOSIS — M62838 Other muscle spasm: Secondary | ICD-10-CM | POA: Diagnosis not present

## 2014-09-09 DIAGNOSIS — M7912 Myalgia of auxiliary muscles, head and neck: Secondary | ICD-10-CM

## 2014-09-09 MED ORDER — IBUPROFEN 800 MG PO TABS: 800.0000 mg | ORAL_TABLET | Freq: Three times a day (TID) | ORAL | Status: DC | PRN

## 2014-09-09 MED ORDER — ONDANSETRON HCL 4 MG/2ML IJ SOLN
4.0000 mg | Freq: Once | INTRAMUSCULAR | Status: AC
  Administered 2014-09-09: 4 mg via INTRAVENOUS
  Filled 2014-09-09: qty 2

## 2014-09-09 MED ORDER — MORPHINE SULFATE 4 MG/ML IJ SOLN
4.0000 mg | Freq: Once | INTRAMUSCULAR | Status: AC
  Administered 2014-09-09: 4 mg via INTRAVENOUS
  Filled 2014-09-09: qty 1

## 2014-09-09 MED ORDER — HYDROCODONE-ACETAMINOPHEN 5-325 MG PO TABS: 1.0000 | ORAL_TABLET | Freq: Four times a day (QID) | ORAL | Status: DC | PRN

## 2014-09-09 NOTE — ED Notes (Signed)
Patient transported to X-ray 

## 2014-09-09 NOTE — ED Notes (Signed)
PA at bedside.

## 2014-09-09 NOTE — Discharge Instructions (Signed)
Use heat on your neck. Return here as needed. Follow up with your primary doctor.

## 2014-09-09 NOTE — ED Notes (Signed)
Reports neck spasm on left side of neck for 2 days, saw PCP yesterday and given scripts for muscle relaxant and tramadol for pain. Took one of each last night and states pain is still too great. Has not taken any additional doses of either medication.

## 2014-09-09 NOTE — ED Notes (Signed)
Patient returned from X-ray 

## 2014-09-09 NOTE — ED Provider Notes (Signed)
CSN: [id number]     Arrival date & time 09/09/14  0856 History   First MD Initiated Contact with Patient 09/09/14 0914     Chief Complaint  Patient presents with  . Neck Pain     (Consider location/radiation/quality/duration/timing/severity/associated sxs/prior Treatment) HPI Patient presents to the emergency department with right-sided neck pain that started 2 days ago.  The patient states she woke up from sleep with significant pain on the right side of her neck.  The patient states that it hurts with movement and palpation, worse.  Patient states that she will and saw her doctor yesterday and states that he gave her tramadol and a muscle relaxant, which did not seem to help with her pain.  She only took one dose.  The patient states she did not try any other treatments prior to arrival.  Patient states that she did not have any trauma associated with this neck pain.  Patient denies headache, blurred vision, weakness, numbness, dizziness, chest pain, shortness of breath, fever or syncope.  The patient states that nothing seems to make her condition better Past Medical History  Diagnosis Date  . Asthma   . Hypertension   . 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 Joint replacement      Bilateral total knee replacements  . Cholecystectomy  02/21/2002  . Right rotator cuff repair  08/12/2006   History reviewed. No pertinent family history. History  Substance Use Topics  . Smoking status: Never Smoker   . Smokeless tobacco: Never Used  . Alcohol Use: No   OB History    No data available     Review of Systems  All other systems negative except as documented in the HPI. All pertinent positives and negatives as reviewed in the HPI.  Allergies  Review of patient's allergies indicates no known allergies.  Home Medications   Prior to Admission medications   Medication Sig Start Date End Date Taking? Authorizing Provider    alendronate (FOSAMAX) 70 MG tablet Take 70 mg by mouth every 7 (seven) days. On Thursdays. Take with a full glass of water on an empty stomach.   Yes Historical Provider, MD  aspirin EC 81 MG tablet Take 81 mg by mouth daily.   Yes Historical Provider, MD  B Complex-C (B-COMPLEX WITH VITAMIN C) tablet Take 1 tablet by mouth daily.   Yes Historical Provider, MD  hydrochlorothiazide (HYDRODIURIL) 25 MG tablet Take 25 mg by mouth daily.   Yes Historical Provider, MD   BP 150/81 mmHg  Pulse 107  Temp(Src) 99.1 F (37.3 C) (Oral)  Resp 18  Ht 5\' 3"  (1.6 m)  Wt 198 lb (89.812 kg)  BMI 35.08 kg/m2  SpO2 96% Physical Exam  Constitutional: She is oriented to person, place, and time. She appears well-developed and well-nourished. No distress.  HENT:  Head: Normocephalic and atraumatic.  Mouth/Throat: Oropharynx is clear and moist. No oropharyngeal exudate.  Eyes: Pupils are equal, round, and reactive to light.  Neck: Neck supple. Muscular tenderness present. No tracheal tenderness and no spinous process tenderness present. No rigidity. Decreased range of motion present. No tracheal deviation, no edema and no erythema present.    Cardiovascular: Normal rate, regular rhythm and normal heart sounds.  Exam reveals no gallop and no friction rub.   No murmur heard. Pulmonary/Chest: Effort normal and breath sounds normal. No stridor. No respiratory distress.  Neurological: She is alert and oriented  to person, place, and time. She exhibits normal muscle tone. Coordination normal.  Skin: Skin is warm and dry. No rash noted. No erythema.  Nursing note and vitals reviewed.   ED Course  Procedures (including critical care time) Labs Review Labs Reviewed - No data to display  Imaging Review Dg Cervical Spine Complete  09/09/2014   CLINICAL DATA:  74 year old female with posterior right neck pain for the past 2 days. No known injury.  EXAM: CERVICAL SPINE  4+ VIEWS  COMPARISON:  Prior CT scan of the  cervical spine 08/05/2013  FINDINGS: There is no evidence of cervical spine fracture or prevertebral soft tissue swelling. Per straightening of the normal cervical lordosis is again noted without significant interval change compared to the prior CT scan. Multilevel degenerative disc disease without significant interval progression. The patient is edentulous. The bones are slightly osteopenic. No definite foraminal narrowing.  IMPRESSION: 1. No acute fracture or malalignment. 2. Multilevel cervical spondylosis without significant interval progression comparing across modalities to the prior CT scan of the cervical spine dated 08/05/2013   Electronically Signed   By: Jacqulynn Cadet M.D.   On: 09/09/2014 11:03    Patient will be treated for sternocleidomastoid strain.  We will advise her to follow-up with her primary care Dr. told to use heat on her neck.  There do not appear to be any signs of vascular issue  MDM   Final diagnoses:  None       Brent General, PA-C 09/09/14 1207  Dot Lanes, MD 09/09/14 [phone]

## 2014-11-07 ENCOUNTER — Other Ambulatory Visit (HOSPITAL_COMMUNITY): Payer: Self-pay | Admitting: Orthopaedic Surgery

## 2014-11-10 ENCOUNTER — Encounter (HOSPITAL_COMMUNITY): Payer: Self-pay

## 2014-11-10 ENCOUNTER — Ambulatory Visit (HOSPITAL_COMMUNITY)
Admission: RE | Admit: 2014-11-10 | Discharge: 2014-11-10 | Disposition: A | Discharge status: Home / Self Care | Payer: Medicare (Managed Care) | Source: Ambulatory Visit | Attending: Orthopaedic Surgery | Admitting: Orthopaedic Surgery

## 2014-11-10 ENCOUNTER — Encounter (HOSPITAL_COMMUNITY)
Admission: RE | Admit: 2014-11-10 | Discharge: 2014-11-10 | Disposition: A | Discharge status: Home / Self Care | Payer: Medicare (Managed Care) | Source: Ambulatory Visit | Attending: Orthopaedic Surgery | Admitting: Orthopaedic Surgery

## 2014-11-10 DIAGNOSIS — Z01810 Encounter for preprocedural cardiovascular examination: Secondary | ICD-10-CM | POA: Diagnosis not present

## 2014-11-10 DIAGNOSIS — Z01818 Encounter for other preprocedural examination: Secondary | ICD-10-CM | POA: Insufficient documentation

## 2014-11-10 DIAGNOSIS — Z01812 Encounter for preprocedural laboratory examination: Secondary | ICD-10-CM | POA: Insufficient documentation

## 2014-11-10 DIAGNOSIS — M1611 Unilateral primary osteoarthritis, right hip: Secondary | ICD-10-CM | POA: Insufficient documentation

## 2014-11-10 HISTORY — DX: Unspecified osteoarthritis, unspecified site: M19.90

## 2014-11-10 LAB — COMPREHENSIVE METABOLIC PANEL
ALK PHOS: 58 U/L (ref 39–117)
ALT: 23 U/L (ref 0–35)
AST: 34 U/L (ref 0–37)
Albumin: 3.8 g/dL (ref 3.5–5.2)
Anion gap: 10 (ref 5–15)
BILIRUBIN TOTAL: 1.1 mg/dL (ref 0.3–1.2)
BUN: 9 mg/dL (ref 6–23)
CHLORIDE: 98 mmol/L (ref 96–112)
CO2: 32 mmol/L (ref 19–32)
Calcium: 9.4 mg/dL (ref 8.4–10.5)
Creatinine, Ser: 0.76 mg/dL (ref 0.50–1.10)
GFR, EST NON AFRICAN AMERICAN: 80 mL/min — AB (ref >90)
Glucose, Bld: 97 mg/dL (ref 70–99)
Potassium: 3.4 mmol/L — ABNORMAL LOW (ref 3.5–5.1)
SODIUM: 140 mmol/L (ref 135–145)
Total Protein: 7.7 g/dL (ref 6.0–8.3)

## 2014-11-10 LAB — URINALYSIS, ROUTINE W REFLEX MICROSCOPIC
Bilirubin Urine: NEGATIVE
Glucose, UA: NEGATIVE mg/dL
HGB URINE DIPSTICK: NEGATIVE
KETONES UR: NEGATIVE mg/dL
LEUKOCYTES UA: NEGATIVE
NITRITE: NEGATIVE
PROTEIN: NEGATIVE mg/dL
SPECIFIC GRAVITY, URINE: 1.016 (ref 1.005–1.030)
Urobilinogen, UA: 0.2 mg/dL (ref 0.0–1.0)
pH: 5 (ref 5.0–8.0)

## 2014-11-10 LAB — SURGICAL PCR SCREEN
MRSA, PCR: NEGATIVE
STAPHYLOCOCCUS AUREUS: POSITIVE — AB

## 2014-11-10 LAB — CBC
HCT: 38.9 % (ref 36.0–46.0)
HEMOGLOBIN: 12.8 g/dL (ref 12.0–15.0)
MCH: 30.3 pg (ref 26.0–34.0)
MCHC: 32.9 g/dL (ref 30.0–36.0)
MCV: 92.2 fL (ref 78.0–100.0)
Platelets: 334 10*3/uL (ref 150–400)
RBC: 4.22 MIL/uL (ref 3.87–5.11)
RDW: 13.4 % (ref 11.5–15.5)
WBC: 8 10*3/uL (ref 4.0–10.5)

## 2014-11-10 LAB — PROTIME-INR
INR: 1.15 (ref 0.00–1.49)
PROTHROMBIN TIME: 14.9 s (ref 11.6–15.2)

## 2014-11-10 NOTE — Progress Notes (Signed)
PATIENT TO SEE EMMI VIDEO AT PAT APPT TODAY.  INFO ALSO GIVEN ON HOW TO ACCESS VIDEO AT HOME.

## 2014-11-10 NOTE — Progress Notes (Signed)
I called a prescription for Mupirocin ointment to [facility] and [facility].

## 2014-11-10 NOTE — Pre-Procedure Instructions (Signed)
Norie Latendresse [facility]  11/10/2014   Your procedure is scheduled on:  Wednesday  11/22/14  Report to [facility] Admitting at 1030 AM.  Call this number if you have problems the morning of surgery: [phone]   Remember:   Do not eat food or drink liquids after midnight.   Take these medicines the morning of surgery with A SIP OF WATER:  HYDROCODONE IF NEEDED  (STOP ASPIRIN, IBUPROFEN/ADVIL/MOTRIN)   Do not wear jewelry, make-up or nail polish.  Do not wear lotions, powders, or perfumes. You may wear deodorant.  Do not shave 48 hours prior to surgery. Men may shave face and neck.  Do not bring valuables to the hospital.  [facility] is not responsible                  for any belongings or valuables.               Contacts, dentures or bridgework may not be worn into surgery.  Leave suitcase in the car. After surgery it may be brought to your room.  For patients admitted to the hospital, discharge time is determined by your                treatment team.               Patients discharged the day of surgery will not be allowed to drive  home.  Name and phone number of your driver:   Special Instructions: [facility] - Preparing for Surgery  Before surgery, you can play an important role.  Because skin is not sterile, your skin needs to be as free of germs as possible.  You can reduce the number of germs on you skin by washing with CHG (chlorahexidine gluconate) soap before surgery.  CHG is an antiseptic cleaner which kills germs and bonds with the skin to continue killing germs even after washing.  Please DO NOT use if you have an allergy to CHG or antibacterial soaps.  If your skin becomes reddened/irritated stop using the CHG and inform your nurse when you arrive at Short Stay.  Do not shave (including legs and underarms) for at least 48 hours prior to the first CHG shower.  You may shave your face.  Please follow these instructions carefully:   1.  Shower with CHG Soap the  night before surgery and the                                morning of Surgery.  2.  If you choose to wash your hair, wash your hair first as usual with your       normal shampoo.  3.  After you shampoo, rinse your hair and body thoroughly to remove the                      Shampoo.  4.  Use CHG as you would any other liquid soap.  You can apply chg directly       to the skin and wash gently with scrungie or a clean washcloth.  5.  Apply the CHG Soap to your body ONLY FROM THE NECK DOWN.        Do not use on open wounds or open sores.  Avoid contact with your eyes,       ears, mouth and genitals (private parts).  Wash genitals (private parts)  with your normal soap.  6.  Wash thoroughly, paying special attention to the area where your surgery        will be performed.  7.  Thoroughly rinse your body with warm water from the neck down.  8.  DO NOT shower/wash with your normal soap after using and rinsing off       the CHG Soap.  9.  Pat yourself dry with a clean towel.            10.  Wear clean pajamas.            11.  Place clean sheets on your bed the night of your first shower and do not        sleep with pets.  Day of Surgery  Do not apply any lotions/deoderants the morning of surgery.  Please wear clean clothes to the hospital/surgery center.     Please read over the following fact sheets that you were given: Pain Booklet, Coughing and Deep Breathing, MRSA Information and Surgical Site Infection Prevention

## 2014-11-21 MED ORDER — CHLORHEXIDINE GLUCONATE 4 % EX LIQD
60.0000 mL | Freq: Once | CUTANEOUS | Status: DC
  Filled 2014-11-21: qty 60

## 2014-11-21 MED ORDER — CEFAZOLIN SODIUM-DEXTROSE 2-3 GM-% IV SOLR
2.0000 g | INTRAVENOUS | Status: AC
  Administered 2014-11-22: 2 g via INTRAVENOUS
  Filled 2014-11-21: qty 50

## 2014-11-21 NOTE — H&P (Signed)
Peggy Welch is an 74 y.o. female.   Chief Complaint:  Left hip pain HPI:  A 74 year old long-term patient of ours with increased problems with the right hip.  She had an injection in September and did well.  She states the pain has gotten severe and she is having difficulty walking and pain wakes her up at night.  She has sharp pain when she rotates her hip.  Patient has had bilateral tongue arthroplasties.  She had a revision on the left due to settling.  She had semi-constrained knee which is functioning well.  Right hip shows bone-on-bone changes.  Opposite left hip looks good.  She has severe pain with internal rotation and only 10 degrees.  Pain wakes her up at night.  She has used a walker in the past as well as a cane.   MEDICATIONS:  Vitamin B complex.  She takes potassium, hydrochlorothiazide as well as Fosamax once a week.  She has been on meloxicam and nabumetone, ibuprofen and Aleve in the past.   FAMILY HISTORY:  Diabetes, hypertension.   SOCIAL HISTORY:  The patient is separated, retired.  Does not smoke or drink.   Past Medical History  Diagnosis Date  . Asthma   . Hypertension   . Hepatitis C     from blood transfusion  . Arthritis     Past Surgical History  Procedure Laterality Date  . Joint replacement      Bilateral total knee replacements  . Cholecystectomy  02/21/2002  . Right rotator cuff repair  08/12/2006    No family history on file. Social History:  reports that she has never smoked. She has never used smokeless tobacco. She reports that she does not drink alcohol or use illicit drugs.  Allergies: No Known Allergies  No prescriptions prior to admission    No results found for this or any previous visit (from the past 48 hour(s)). No results found.  ROS REVIEW OF SYSTEMS:  Positive for arthritis, asthma, hepatitis C and hypertension.  There were no vitals taken for this visit. Physical Exam  PHYSICAL EXAMINATION:  The patient is 5 feet 3  inches, 198 pounds.  Alert and oriented.  WDWN.  NAD.  Extraocular movements intact.  Lungs are clear.  Heart:  Regular rate and rhythm.  Abdomen soft, nontender.  No pain with hip range of motion on the left.  Right has only 10 degrees of internal rotation and reproduces her pain.  She has 10 degree hip flexion contracture.  External rotation 30 degrees.  Right knee reaches full extension.  Flexes to 110 degrees.  Distal pulses are 2+.  No sciatic notch tenderness.  Negative popliteal compression test.  No cellulitis.  No pitting edema.     \ASSESSMENT:  Right hip osteoarthritis.  She has already been through multiple anti-inflammatories.  She has walking aid.  She has had intra-articular injection which gave her only temporary relief.  She understands recommendations would be anterior total hip arthroplasty.    PLAN:  She can consider options and call if she would like to proceed.  Her daughter was here with her today and had a long discussion concerning her options, operative technique, indications for surgery were discussed   Peggy Welch M 11/21/2014, 3:45 PM

## 2014-11-22 ENCOUNTER — Encounter (HOSPITAL_COMMUNITY): Payer: Self-pay | Admitting: *Deleted

## 2014-11-22 ENCOUNTER — Inpatient Hospital Stay (HOSPITAL_COMMUNITY): Payer: Medicare (Managed Care) | Admitting: Anesthesiology

## 2014-11-22 ENCOUNTER — Inpatient Hospital Stay (HOSPITAL_COMMUNITY)
Admission: RE | Admit: 2014-11-22 | Discharge: 2014-11-25 | DRG: 470 | Disposition: A | Discharge status: Skilled Nursing Facility | Payer: Medicare (Managed Care) | Source: Ambulatory Visit | Attending: Orthopaedic Surgery | Admitting: Orthopaedic Surgery

## 2014-11-22 ENCOUNTER — Encounter (HOSPITAL_COMMUNITY)
Admission: RE | Disposition: A | Discharge status: Skilled Nursing Facility | Payer: Self-pay | Source: Ambulatory Visit | Attending: Orthopaedic Surgery

## 2014-11-22 ENCOUNTER — Inpatient Hospital Stay (HOSPITAL_COMMUNITY): Payer: Medicare (Managed Care)

## 2014-11-22 DIAGNOSIS — J45909 Unspecified asthma, uncomplicated: Secondary | ICD-10-CM | POA: Diagnosis present

## 2014-11-22 DIAGNOSIS — I10 Essential (primary) hypertension: Secondary | ICD-10-CM | POA: Diagnosis present

## 2014-11-22 DIAGNOSIS — M25551 Pain in right hip: Secondary | ICD-10-CM | POA: Diagnosis not present

## 2014-11-22 DIAGNOSIS — Z96653 Presence of artificial knee joint, bilateral: Secondary | ICD-10-CM | POA: Diagnosis present

## 2014-11-22 DIAGNOSIS — M1611 Unilateral primary osteoarthritis, right hip: Principal | ICD-10-CM | POA: Diagnosis present

## 2014-11-22 DIAGNOSIS — Z419 Encounter for procedure for purposes other than remedying health state, unspecified: Secondary | ICD-10-CM

## 2014-11-22 DIAGNOSIS — B1920 Unspecified viral hepatitis C without hepatic coma: Secondary | ICD-10-CM | POA: Diagnosis present

## 2014-11-22 DIAGNOSIS — Z7983 Long term (current) use of bisphosphonates: Secondary | ICD-10-CM | POA: Diagnosis not present

## 2014-11-22 DIAGNOSIS — Z96649 Presence of unspecified artificial hip joint: Secondary | ICD-10-CM

## 2014-11-22 HISTORY — PX: TOTAL HIP ARTHROPLASTY: SHX124

## 2014-11-22 SURGERY — ARTHROPLASTY, HIP, TOTAL, ANTERIOR APPROACH
Anesthesia: Spinal | Laterality: Right

## 2014-11-22 MED ORDER — LACTATED RINGERS IV SOLN
INTRAVENOUS | Status: DC
  Administered 2014-11-22: 11:00:00 via INTRAVENOUS

## 2014-11-22 MED ORDER — METOCLOPRAMIDE HCL 5 MG/ML IJ SOLN: 5.0000 mg | Freq: Three times a day (TID) | INTRAMUSCULAR | Status: DC | PRN

## 2014-11-22 MED ORDER — BUPIVACAINE HCL (PF) 0.5 % IJ SOLN
INTRAMUSCULAR | Status: DC | PRN
  Administered 2014-11-22: 3 mL

## 2014-11-22 MED ORDER — PROPOFOL INFUSION 10 MG/ML OPTIME
INTRAVENOUS | Status: DC | PRN
  Administered 2014-11-22: 50 ug/kg/min via INTRAVENOUS

## 2014-11-22 MED ORDER — LIDOCAINE HCL (CARDIAC) 20 MG/ML IV SOLN
INTRAVENOUS | Status: DC | PRN
  Administered 2014-11-22: 50 mg via INTRAVENOUS

## 2014-11-22 MED ORDER — PHENYLEPHRINE HCL 10 MG/ML IJ SOLN
10.0000 mg | INTRAVENOUS | Status: DC | PRN
  Administered 2014-11-22: 25 ug/min via INTRAVENOUS

## 2014-11-22 MED ORDER — DEXAMETHASONE SODIUM PHOSPHATE 4 MG/ML IJ SOLN
INTRAMUSCULAR | Status: AC
  Filled 2014-11-22: qty 2

## 2014-11-22 MED ORDER — OXYCODONE HCL 5 MG PO TABS
5.0000 mg | ORAL_TABLET | ORAL | Status: DC | PRN
  Administered 2014-11-22 – 2014-11-23 (×5): 10 mg via ORAL
  Filled 2014-11-22 (×7): qty 2

## 2014-11-22 MED ORDER — 0.9 % SODIUM CHLORIDE (POUR BTL) OPTIME
TOPICAL | Status: DC | PRN
  Administered 2014-11-22: 1000 mL

## 2014-11-22 MED ORDER — PHENOL 1.4 % MT LIQD: 1.0000 | OROMUCOSAL | Status: DC | PRN

## 2014-11-22 MED ORDER — METOCLOPRAMIDE HCL 5 MG PO TABS: 5.0000 mg | ORAL_TABLET | Freq: Three times a day (TID) | ORAL | Status: DC | PRN

## 2014-11-22 MED ORDER — PROPOFOL 10 MG/ML IV BOLUS
INTRAVENOUS | Status: AC
  Filled 2014-11-22: qty 20

## 2014-11-22 MED ORDER — MENTHOL 3 MG MT LOZG: 1.0000 | LOZENGE | OROMUCOSAL | Status: DC | PRN

## 2014-11-22 MED ORDER — POTASSIUM CHLORIDE IN NACL 20-0.45 MEQ/L-% IV SOLN
INTRAVENOUS | Status: DC
  Administered 2014-11-22 – 2014-11-24 (×3): via INTRAVENOUS
  Filled 2014-11-22 (×8): qty 1000

## 2014-11-22 MED ORDER — METHOCARBAMOL 500 MG PO TABS
500.0000 mg | ORAL_TABLET | Freq: Four times a day (QID) | ORAL | Status: DC | PRN
  Administered 2014-11-22: 500 mg via ORAL
  Filled 2014-11-22: qty 1

## 2014-11-22 MED ORDER — ONDANSETRON HCL 4 MG/2ML IJ SOLN: 4.0000 mg | Freq: Four times a day (QID) | INTRAMUSCULAR | Status: DC | PRN

## 2014-11-22 MED ORDER — ACETAMINOPHEN 650 MG RE SUPP: 650.0000 mg | Freq: Four times a day (QID) | RECTAL | Status: DC | PRN

## 2014-11-22 MED ORDER — LIDOCAINE HCL (CARDIAC) 20 MG/ML IV SOLN
INTRAVENOUS | Status: AC
  Filled 2014-11-22: qty 5

## 2014-11-22 MED ORDER — PHENYLEPHRINE HCL 10 MG/ML IJ SOLN
INTRAMUSCULAR | Status: DC | PRN
  Administered 2014-11-22 (×2): 80 ug via INTRAVENOUS

## 2014-11-22 MED ORDER — ONDANSETRON HCL 4 MG/2ML IJ SOLN
INTRAMUSCULAR | Status: DC | PRN
  Administered 2014-11-22: 4 mg via INTRAVENOUS

## 2014-11-22 MED ORDER — ACETAMINOPHEN 325 MG PO TABS
650.0000 mg | ORAL_TABLET | Freq: Four times a day (QID) | ORAL | Status: DC | PRN
  Administered 2014-11-23 – 2014-11-25 (×4): 650 mg via ORAL
  Filled 2014-11-22 (×4): qty 2

## 2014-11-22 MED ORDER — LACTATED RINGERS IV SOLN
INTRAVENOUS | Status: DC | PRN
  Administered 2014-11-22 (×2): via INTRAVENOUS

## 2014-11-22 MED ORDER — MIDAZOLAM HCL 5 MG/5ML IJ SOLN
INTRAMUSCULAR | Status: DC | PRN
  Administered 2014-11-22: 2 mg via INTRAVENOUS

## 2014-11-22 MED ORDER — DOCUSATE SODIUM 100 MG PO CAPS
100.0000 mg | ORAL_CAPSULE | Freq: Two times a day (BID) | ORAL | Status: DC
  Administered 2014-11-22 – 2014-11-25 (×6): 100 mg via ORAL
  Filled 2014-11-22 (×7): qty 1

## 2014-11-22 MED ORDER — MIDAZOLAM HCL 2 MG/2ML IJ SOLN
INTRAMUSCULAR | Status: AC
  Filled 2014-11-22: qty 2

## 2014-11-22 MED ORDER — ONDANSETRON HCL 4 MG/2ML IJ SOLN
INTRAMUSCULAR | Status: AC
  Filled 2014-11-22: qty 2

## 2014-11-22 MED ORDER — ASPIRIN EC 325 MG PO TBEC
325.0000 mg | DELAYED_RELEASE_TABLET | Freq: Every day | ORAL | Status: DC
  Administered 2014-11-23 – 2014-11-25 (×3): 325 mg via ORAL
  Filled 2014-11-22 (×3): qty 1

## 2014-11-22 MED ORDER — FENTANYL CITRATE 0.05 MG/ML IJ SOLN
INTRAMUSCULAR | Status: AC
  Filled 2014-11-22: qty 5

## 2014-11-22 MED ORDER — SENNOSIDES-DOCUSATE SODIUM 8.6-50 MG PO TABS: 1.0000 | ORAL_TABLET | Freq: Every evening | ORAL | Status: DC | PRN

## 2014-11-22 MED ORDER — ROCURONIUM BROMIDE 50 MG/5ML IV SOLN
INTRAVENOUS | Status: AC
  Filled 2014-11-22: qty 1

## 2014-11-22 MED ORDER — EPHEDRINE SULFATE 50 MG/ML IJ SOLN
INTRAMUSCULAR | Status: DC | PRN
  Administered 2014-11-22 (×3): 10 mg via INTRAVENOUS

## 2014-11-22 MED ORDER — METHOCARBAMOL 1000 MG/10ML IJ SOLN: 500.0000 mg | Freq: Four times a day (QID) | INTRAVENOUS | Status: DC | PRN

## 2014-11-22 MED ORDER — HYDROMORPHONE HCL 1 MG/ML IJ SOLN
0.5000 mg | INTRAMUSCULAR | Status: DC | PRN
  Administered 2014-11-22 (×2): 0.5 mg via INTRAVENOUS
  Filled 2014-11-22 (×2): qty 1

## 2014-11-22 MED ORDER — ONDANSETRON HCL 4 MG PO TABS: 4.0000 mg | ORAL_TABLET | Freq: Four times a day (QID) | ORAL | Status: DC | PRN

## 2014-11-22 MED ORDER — FENTANYL CITRATE 0.05 MG/ML IJ SOLN
INTRAMUSCULAR | Status: DC | PRN
  Administered 2014-11-22 (×2): 50 ug via INTRAVENOUS

## 2014-11-22 SURGICAL SUPPLY — 62 items
ADH SKN CLS APL DERMABOND .7 (GAUZE/BANDAGES/DRESSINGS) ×1
ADH SKN CLS LQ APL DERMABOND (GAUZE/BANDAGES/DRESSINGS) ×1
APL SKNCLS STERI-STRIP NONHPOA (GAUZE/BANDAGES/DRESSINGS) ×1
BENZOIN TINCTURE PRP APPL 2/3 (GAUZE/BANDAGES/DRESSINGS) ×2 IMPLANT
BLADE SAW SGTL 18X1.27X75 (BLADE) ×2 IMPLANT
BLADE SAW SGTL 18X1.27X75MM (BLADE) ×1
BLADE SURG ROTATE 9660 (MISCELLANEOUS) IMPLANT
CAPT HIP TOTAL 2 ×2 IMPLANT
CELLS DAT CNTRL 66122 CELL SVR (MISCELLANEOUS) ×1 IMPLANT
CLOSURE STERI-STRIP 1/2X4 (GAUZE/BANDAGES/DRESSINGS) ×1
CLSR STERI-STRIP ANTIMIC 1/2X4 (GAUZE/BANDAGES/DRESSINGS) ×1 IMPLANT
COVER SURGICAL LIGHT HANDLE (MISCELLANEOUS) ×3 IMPLANT
DERMABOND ADHESIVE PROPEN (GAUZE/BANDAGES/DRESSINGS) ×2
DERMABOND ADVANCED (GAUZE/BANDAGES/DRESSINGS) ×2
DERMABOND ADVANCED .7 DNX12 (GAUZE/BANDAGES/DRESSINGS) ×1 IMPLANT
DERMABOND ADVANCED .7 DNX6 (GAUZE/BANDAGES/DRESSINGS) IMPLANT
DRAPE C-ARM 42X72 X-RAY (DRAPES) ×3 IMPLANT
DRAPE IMP U-DRAPE 54X76 (DRAPES) ×3 IMPLANT
DRAPE STERI IOBAN 125X83 (DRAPES) ×3 IMPLANT
DRAPE U-SHAPE 47X51 STRL (DRAPES) ×9 IMPLANT
DRSG MEPILEX BORDER 4X8 (GAUZE/BANDAGES/DRESSINGS) ×3 IMPLANT
DURAPREP 26ML APPLICATOR (WOUND CARE) ×3 IMPLANT
ELECT BLADE 4.0 EZ CLEAN MEGAD (MISCELLANEOUS) ×3
ELECT BLADE TIP CTD 4 INCH (ELECTRODE) ×3 IMPLANT
ELECT CAUTERY BLADE 6.4 (BLADE) ×3 IMPLANT
ELECT REM PT RETURN 9FT ADLT (ELECTROSURGICAL) ×3
ELECTRODE BLDE 4.0 EZ CLN MEGD (MISCELLANEOUS) IMPLANT
ELECTRODE REM PT RTRN 9FT ADLT (ELECTROSURGICAL) ×1 IMPLANT
FACESHIELD WRAPAROUND (MASK) ×6 IMPLANT
FACESHIELD WRAPAROUND OR TEAM (MASK) ×2 IMPLANT
GLOVE BIOGEL PI IND STRL 7.0 (GLOVE) IMPLANT
GLOVE BIOGEL PI IND STRL 8 (GLOVE) ×2 IMPLANT
GLOVE BIOGEL PI INDICATOR 7.0 (GLOVE) ×2
GLOVE BIOGEL PI INDICATOR 8 (GLOVE) ×4
GLOVE ORTHO TXT STRL SZ7.5 (GLOVE) ×8 IMPLANT
GLOVE SURG SS PI 6.5 STRL IVOR (GLOVE) ×2 IMPLANT
GLOVE SURG SS PI 7.0 STRL IVOR (GLOVE) ×2 IMPLANT
GOWN STRL REUS W/ TWL LRG LVL3 (GOWN DISPOSABLE) ×1 IMPLANT
GOWN STRL REUS W/ TWL XL LVL3 (GOWN DISPOSABLE) ×1 IMPLANT
GOWN STRL REUS W/TWL 2XL LVL3 (GOWN DISPOSABLE) ×3 IMPLANT
GOWN STRL REUS W/TWL LRG LVL3 (GOWN DISPOSABLE) ×6
GOWN STRL REUS W/TWL XL LVL3 (GOWN DISPOSABLE) ×6
KIT BASIN OR (CUSTOM PROCEDURE TRAY) ×3 IMPLANT
KIT ROOM TURNOVER OR (KITS) ×3 IMPLANT
MANIFOLD NEPTUNE II (INSTRUMENTS) ×3 IMPLANT
NS IRRIG 1000ML POUR BTL (IV SOLUTION) ×3 IMPLANT
PACK TOTAL JOINT (CUSTOM PROCEDURE TRAY) ×3 IMPLANT
PACK UNIVERSAL I (CUSTOM PROCEDURE TRAY) ×3 IMPLANT
PAD ARMBOARD 7.5X6 YLW CONV (MISCELLANEOUS) ×6 IMPLANT
RETRACTOR WND ALEXIS 18 MED (MISCELLANEOUS) ×1 IMPLANT
RTRCTR WOUND ALEXIS 18CM MED (MISCELLANEOUS) ×3
SUT ETHIBOND NAB CT1 #1 30IN (SUTURE) IMPLANT
SUT VIC AB 0 CT1 27 (SUTURE) ×3
SUT VIC AB 0 CT1 27XBRD ANBCTR (SUTURE) ×1 IMPLANT
SUT VIC AB 2-0 CT1 27 (SUTURE) ×3
SUT VIC AB 2-0 CT1 TAPERPNT 27 (SUTURE) ×1 IMPLANT
SUT VICRYL 4-0 PS2 18IN ABS (SUTURE) ×3 IMPLANT
SUT VLOC 180 0 24IN GS25 (SUTURE) ×3 IMPLANT
TOWEL OR 17X24 6PK STRL BLUE (TOWEL DISPOSABLE) ×3 IMPLANT
TOWEL OR 17X26 10 PK STRL BLUE (TOWEL DISPOSABLE) ×6 IMPLANT
TRAY FOLEY CATH 16FRSI W/METER (SET/KITS/TRAYS/PACK) IMPLANT
WATER STERILE IRR 1000ML POUR (IV SOLUTION) ×6 IMPLANT

## 2014-11-22 NOTE — Anesthesia Postprocedure Evaluation (Signed)
Anesthesia Post Note  Patient: Peggy Welch  Procedure(s) Performed: Procedure(s) (LRB): TOTAL HIP ARTHROPLASTY ANTERIOR APPROACH (Right)  Anesthesia type: Spinal  Patient location: PACU  Post pain: Pain level controlled  Post assessment: Post-op Vital signs reviewed  Last Vitals: BP 137/67 mmHg  Pulse 84  Temp(Src) 36.4 C (Oral)  Resp 19  SpO2 100%  Post vital signs: Reviewed  Level of consciousness: sedated  Complications: No apparent anesthesia complications

## 2014-11-22 NOTE — Interval H&P Note (Signed)
History and Physical Interval Note:  11/22/2014 12:22 PM  Peggy Welch  has presented today for surgery, with the diagnosis of Right Hip Osteoarthritis  The various methods of treatment have been discussed with the patient and family. After consideration of risks, benefits and other options for treatment, the patient has consented to  Procedure(s): TOTAL HIP ARTHROPLASTY ANTERIOR APPROACH (Right) as a surgical intervention .  The patient's history has been reviewed, patient examined, no change in status, stable for surgery.  I have reviewed the patient's chart and labs.  Questions were answered to the patient's satisfaction.     Arjun Hard C

## 2014-11-22 NOTE — Care Management Note (Signed)
Utilization Review completed   Abbigail Anstey,RN, BSN,CCM 

## 2014-11-22 NOTE — Transfer of Care (Signed)
Immediate Anesthesia Transfer of Care Note  Patient: Peggy Welch  Procedure(s) Performed: Procedure(s): TOTAL HIP ARTHROPLASTY ANTERIOR APPROACH (Right)  Patient Location: PACU  Anesthesia Type:Spinal  Level of Consciousness: awake, alert , oriented and patient cooperative  Airway & Oxygen Therapy: Patient Spontanous Breathing  Post-op Assessment: Report given to RN and Post -op Vital signs reviewed and stable  Post vital signs: Reviewed and stable  Last Vitals:  Filed Vitals:   11/22/14 1516  BP:   Pulse: 85  Temp:   Resp: 20    Complications: No apparent anesthesia complications

## 2014-11-22 NOTE — Anesthesia Preprocedure Evaluation (Addendum)
Anesthesia Evaluation  Patient identified by MRN, date of birth, ID band Patient awake    Reviewed: Allergy & Precautions, NPO status , Patient's Chart, lab work & pertinent test results  Airway Mallampati: II  TM Distance: >3 FB Neck ROM: Full    Dental no notable dental hx.    Pulmonary asthma ,  breath sounds clear to auscultation  Pulmonary exam normal       Cardiovascular hypertension, Pt. on medications negative cardio ROS  Rhythm:Regular Rate:Normal     Neuro/Psych negative neurological ROS  negative psych ROS   GI/Hepatic negative GI ROS, (+) Hepatitis -  Endo/Other  negative endocrine ROS  Renal/GU negative Renal ROS     Musculoskeletal  (+) Arthritis -,   Abdominal   Peds  Hematology negative hematology ROS (+)   Anesthesia Other Findings   Reproductive/Obstetrics negative OB ROS                           Anesthesia Physical Anesthesia Plan  ASA: III  Anesthesia Plan: Spinal   Post-op Pain Management:    Induction:   Airway Management Planned:   Additional Equipment:   Intra-op Plan:   Post-operative Plan:   Informed Consent: I have reviewed the patients History and Physical, chart, labs and discussed the procedure including the risks, benefits and alternatives for the proposed anesthesia with the patient or authorized representative who has indicated his/her understanding and acceptance.   Dental advisory given  Plan Discussed with: CRNA  Anesthesia Plan Comments:       Anesthesia Quick Evaluation

## 2014-11-22 NOTE — Anesthesia Procedure Notes (Signed)
Spinal Patient location during procedure: OR Staffing Anesthesiologist: Nolon Nations Performed by: anesthesiologist  Preanesthetic Checklist Completed: patient identified, site marked, surgical consent, pre-op evaluation, timeout performed, IV checked, risks and benefits discussed and monitors and equipment checked Spinal Block Patient position: sitting Prep: ChloraPrep Patient monitoring: heart rate, continuous pulse ox and blood pressure Approach: right paramedian Location: L3-4 Injection technique: single-shot Needle Needle type: Sprotte  Needle gauge: 24 G Needle length: 9 cm Assessment Sensory level: T8 Additional Notes Expiration date of kit checked and confirmed. Patient tolerated procedure well, without complications.

## 2014-11-22 NOTE — Brief Op Note (Signed)
11/22/2014  3:09 PM  PATIENT:  Peggy Welch  74 y.o. female  PRE-OPERATIVE DIAGNOSIS:  Right Hip Osteoarthritis  POST-OPERATIVE DIAGNOSIS:  Right Hip Osteoarthritis  PROCEDURE:  Procedure(s): TOTAL HIP ARTHROPLASTY ANTERIOR APPROACH (Right)  SURGEON:  Surgeon(s) and Role:    * Marybelle Killings, MD - Primary  PHYSICIAN ASSISTANT: Tyrese Ficek m. Darbi Chandran pa-c    ANESTHESIA:   spinal  EBL:  Total I/O In: 1000 [I.V.:1000] Out: 350 [Blood:350]  BLOOD ADMINISTERED:none  DRAINS: none   LOCAL MEDICATIONS USED:  NONE  SPECIMEN:  No Specimen  DISPOSITION OF SPECIMEN:  N/A  COUNTS:  YES  TOURNIQUET:  * No tourniquets in log *    PATIENT DISPOSITION:  PACU - hemodynamically stable.

## 2014-11-23 ENCOUNTER — Encounter (HOSPITAL_COMMUNITY): Payer: Self-pay | Admitting: General Practice

## 2014-11-23 LAB — BASIC METABOLIC PANEL
Anion gap: 8 (ref 5–15)
BUN: 12 mg/dL (ref 6–23)
CHLORIDE: 97 mmol/L (ref 96–112)
CO2: 30 mmol/L (ref 19–32)
CREATININE: 0.95 mg/dL (ref 0.50–1.10)
Calcium: 8.2 mg/dL — ABNORMAL LOW (ref 8.4–10.5)
GFR calc non Af Amer: 57 mL/min — ABNORMAL LOW (ref >90)
GFR, EST AFRICAN AMERICAN: 66 mL/min — AB (ref >90)
Glucose, Bld: 121 mg/dL — ABNORMAL HIGH (ref 70–99)
Potassium: 3 mmol/L — ABNORMAL LOW (ref 3.5–5.1)
Sodium: 135 mmol/L (ref 135–145)

## 2014-11-23 LAB — CBC
HEMATOCRIT: 29.1 % — AB (ref 36.0–46.0)
HEMOGLOBIN: 9.8 g/dL — AB (ref 12.0–15.0)
MCH: 31.7 pg (ref 26.0–34.0)
MCHC: 33.7 g/dL (ref 30.0–36.0)
MCV: 94.2 fL (ref 78.0–100.0)
Platelets: 273 10*3/uL (ref 150–400)
RBC: 3.09 MIL/uL — AB (ref 3.87–5.11)
RDW: 13.9 % (ref 11.5–15.5)
WBC: 10.9 10*3/uL — AB (ref 4.0–10.5)

## 2014-11-23 MED ORDER — DIPHENHYDRAMINE HCL 25 MG PO CAPS
25.0000 mg | ORAL_CAPSULE | Freq: Four times a day (QID) | ORAL | Status: DC | PRN
  Administered 2014-11-23 – 2014-11-24 (×2): 25 mg via ORAL
  Filled 2014-11-23 (×2): qty 1

## 2014-11-23 MED ORDER — DIPHENHYDRAMINE HCL 50 MG/ML IJ SOLN: 25.0000 mg | Freq: Four times a day (QID) | INTRAMUSCULAR | Status: DC | PRN

## 2014-11-23 NOTE — Progress Notes (Signed)
Subjective: 1 Day Post-Op Procedure(s) (LRB): TOTAL HIP ARTHROPLASTY ANTERIOR APPROACH (Right) Patient reports pain as moderate.    Objective: Vital signs in last 24 hours: Temp:  [97.5 F (36.4 Welch)-100.1 F (37.8 Welch)] 100.1 F (37.8 Welch) (03/31 0405) Pulse Rate:  [73-100] 100 (03/31 0405) Resp:  [14-21] 18 (03/31 0405) BP: (97-137)/(54-87) 105/68 mmHg (03/31 0405) SpO2:  [92 %-100 %] 99 % (03/31 0405)  Intake/Output from previous day: 03/30 0701 - 03/31 0700 In: 1340 [P.O.:240; I.V.:1100] Out: 650 [Urine:300; Blood:350] Intake/Output this shift:    No results for input(s): HGB in the last 72 hours. No results for input(s): WBC, RBC, HCT, PLT in the last 72 hours. No results for input(s): NA, K, CL, CO2, BUN, CREATININE, GLUCOSE, CALCIUM in the last 72 hours. No results for input(s): LABPT, INR in the last 72 hours.  Neurologically intact  Assessment/Plan: 1 Day Post-Op Procedure(s) (LRB): TOTAL HIP ARTHROPLASTY ANTERIOR APPROACH (Right) Up with therapy   50 % WB .  Labs just being  drawn and results pending.   Peggy Welch 11/23/2014, 8:05 AM

## 2014-11-23 NOTE — Progress Notes (Signed)
Physical Therapy Treatment Patient Details Name: Peggy Welch MRN: [id number] DOB: 1941/07/29 Today's Date: 11/23/2014    History of Present Illness Pt is a 74 y.o. female s/p Rt THA    PT Comments    Pt requiring 2 person (A) for mobility and transfers at this time. Cont to recommend SNF upon acute D/C. Pt fatigues very quickly and is a high fall risk. Will cont to follow per POC.   Follow Up Recommendations  SNF     Equipment Recommendations  Rolling walker with 5" wheels    Recommendations for Other Services       Precautions / Restrictions Precautions Precautions: Fall Restrictions Weight Bearing Restrictions: Yes RLE Weight Bearing: Partial weight bearing RLE Partial Weight Bearing Percentage or Pounds: 50    Mobility  Bed Mobility Overal bed mobility: Needs Assistance Bed Mobility: Supine to Sit;Sit to Supine Rolling: Min assist   Supine to sit: Mod assist;HOB elevated Sit to supine: Max assist Sit to sidelying: Mod assist;+2 for physical assistance General bed mobility comments: (A) to bring LEs to/off EOB and handheld (A) to elevate trunk for sitting position; max (A) to return to supine due to fatigue; incr time due to pain  Transfers Overall transfer level: Needs assistance Equipment used: Rolling walker (2 wheeled) Transfers: Sit to/from Stand Sit to Stand: Mod assist;+2 physical assistance Stand pivot transfers: Mod assist;+2 physical assistance       General transfer comment: 2 person for safety; pt requiring incr time to balance and power up; pt fatigues quickly; max cueing for sequencing and safety; pt with incr difficuly transferring UEs during sit to stand transitions  Ambulation/Gait Ambulation/Gait assistance: +2 safety/equipment;Min assist Ambulation Distance (Feet): 10 Feet (8', 2') Assistive device: Rolling walker (2 wheeled) Gait Pattern/deviations: Step-through pattern;Decreased stride length;Wide base of support;Trunk  flexed;Shuffle Gait velocity: decr Gait velocity interpretation: Below normal speed for age/gender General Gait Details: pt very unsteady an fatigues quickly; 2 person (A) for safety; pt able to ambulate 8' towards bathroom then required sitting rest break and was able to void; after pt was unable to make it back to bed and required bed to be moved to her to return to sitting; max cueing for upright posture and gt sequencing; pt maintaining fwd flex posture throughout; hand over hand (A) for proper hand positioning on RW   Stairs            Wheelchair Mobility    Modified Rankin (Stroke Patients Only)       Balance Overall balance assessment: Needs assistance Sitting-balance support: Feet supported;Single extremity supported;Bilateral upper extremity supported Sitting balance-Leahy Scale: Poor Sitting balance - Comments: guarded sitting EOB and leaning to Lt throughout EOB sitting Postural control: Posterior lean;Left lateral lean Standing balance support: During functional activity;Bilateral upper extremity supported Standing balance-Leahy Scale: Zero Standing balance comment: (A) at all times and RW To balance ; pt requiring total (A) at [facility]                    Cognition Arousal/Alertness: Awake/alert Behavior During Therapy: WFL for tasks assessed/performed Overall Cognitive Status: Within Functional Limits for tasks assessed                      Exercises Total Joint Exercises Ankle Circles/Pumps: AROM;Both;10 reps    General Comments General comments (skin integrity, edema, etc.): discussed rehab goals again with sister and pt; encouraged cont OOB activity with nursing;      Pertinent Vitals/Pain Pain  Assessment: 0-10 Pain Score: 8  Pain Location: Rt hip Pain Descriptors / Indicators: Aching;Constant;Grimacing Pain Intervention(s): Monitored during session;Limited activity within patient's tolerance;Premedicated before session;Repositioned     Home Living                      Prior Function            PT Goals (current goals can now be found in the care plan section) Acute Rehab PT Goals Patient Stated Goal: get better to go home PT Goal Formulation: With patient Time For Goal Achievement: 11/30/14 Potential to Achieve Goals: Good Progress towards PT goals: Progressing toward goals    Frequency  7X/week    PT Plan Current plan remains appropriate    Co-evaluation             End of Session Equipment Utilized During Treatment: Gait belt Activity Tolerance: Patient limited by fatigue;Patient limited by pain Patient left: in bed;with call bell/phone within reach;with family/visitor present     Time: 5009-3818 PT Time Calculation (min) (ACUTE ONLY): 25 min  Charges:  $Gait Training: 8-22 mins $Therapeutic Activity: 8-22 mins                    G CodesGustavus Bryant PT [phone] 11/23/2014, 4:37 PM

## 2014-11-23 NOTE — Op Note (Signed)
NAMERADHA, COGGINS NO.:  [id number]  MEDICAL RECORD NO.:  [id number]  LOCATION:  5N04C                        FACILITY:  [facility]  PHYSICIAN:  Jawad Wiacek C. Lorin Mercy, M.D.    DATE OF BIRTH:  04-Jan-1941  DATE OF PROCEDURE:  11/22/2014 DATE OF DISCHARGE:                              OPERATIVE REPORT   PREOPERATIVE DIAGNOSIS:  Right hip osteoarthritis with flexion contracture.  POSTOPERATIVE DIAGNOSIS:  Right hip osteoarthritis with flexion contracture.  PROCEDURE:  Right total hip arthroplasty, direct anterior approach. Fifty-two Gription DePuy cup, 36 poly liner without lip, +1.5 neck length, #11 stem press-fit.  SURGEON:  Sunita Demond C. Lorin Mercy, M.D.  ASSISTANT:  Alyson Locket. Ricard Dillon, PA-C, medically necessary and present for the entire procedure.  ANESTHESIA:  General.  ESTIMATED BLOOD LOSS:  100 mL.  DRAINS:  None.  DESCRIPTION OF PROCEDURE:  After induction of spinal anesthesia, the patient was placed on the [facility] table, careful prepping, positioning.  C- arm was brought in for confirmation of good visualization of both hips. Two 1015-drapes were applied followed by prepping with DuraPrep.  Ancef prophylaxis, split sheets, large shower curtain, Steri-Drape, half sheet at the top and half on the opposite across the table.  Time-out procedure was completed.  Incision was made 2 cm distal, 2 cm lateral to the ASIS obliquely down towards the trochanter.  Subcutaneous tissue was developed for the skin retractor which was placed.  Fascia was split and grasped with an Allis clamp and then developed medially falling in the appropriate plane and __________ retractor placed medially and lateral.  Large transverse vein and accompanying artery were coagulated.  Anterior capsule was opened, and Kober retractors were placed on each side of the neck.  C-arm was brought in and neck was cut a few millimeters above the lesser trochanter.  External rotation and posterior aspect of the  capsule was peeled off the neck.  Sequential reaming of the acetabulum was performed after thick hypertrophic overhanging osteophytes were removed as well as degenerative labrum. Reaming up to 51, cup was inserted.  Did not quite sit down appropriately, it was pulled out, repeat touch up reaming and then impaction in excellent position.  Central hole was inserted followed by the permanent poly liner.  Next, the hydraulic hook was applied, leg was taken in external rotation down to the floor and over and adduction and then cookie cutter, chili pepper, sequential broaching medially. Trochanteric retractor was placed.  Small crack occurred in the trochanter near the end of the case as the #11 size was being inserted. The patient had relatively soft bone.  The trochanter was stable.  There was no proximal migration and then 11 gave an excellent tight fit. Permanent prosthesis was inserted after trials showed the +1.5 restored the appropriate length.  Permanent Corail stem was inserted.  +1.5, 36 ball, reduction of the hip after removing the hydraulic hook. Trochanter was in good position.  No proximal migration.  Irrigation with saline solution.  External rotation in 90 degrees showed good stability, __________, good leg length by measurement.  Cup flexion and abduction looked good.  Stem went centrally down the hip, both AP and lateral.  After repeat irrigation, __________ was  used for fascia closure __________ subcutaneous tissue, subcuticular closure. Dermabond postop dressing and transferred to the recovery room in stable condition.  Instrument count and needle count were correct.     Emmarie Sannes C. Lorin Mercy, M.D.     MCY/MEDQ  D:  11/22/2014  T:  11/23/2014  Job:  [id number]

## 2014-11-23 NOTE — Evaluation (Signed)
Occupational Therapy Evaluation Patient Details Name: Peggy Welch MRN: [id number] DOB: 1941/06/29 Today's Date: 11/23/2014    History of Present Illness Pt is a 74 y.o. female s/p Rt THA   Clinical Impression   Patient mod I PTA. Patient currently requires up to total assist +2 for ADLs and mod assist +2 for functional mobility/transfers. Patient will benefit from acute OT to increase overall independence in the areas of ADLs, functional mobility, and overall safety in order to safely discharge to venue listed below. Patient with two sisters present in room during OT eval/treat. Sisters adamant that patient discharge back home. Therapist attempted to have sisters assist patient back to bed and both unable at this time due to patient's impairments and pain. Recommending SNF for comprehensive rehabilitation prior to patient discharging back home.       Follow Up Recommendations  SNF;Supervision/Assistance - 24 hour    Equipment Recommendations   (TBD by next venue of care)    Recommendations for Other Services  None at this time    Precautions / Restrictions Precautions Precautions: Fall Restrictions Weight Bearing Restrictions: Yes RLE Weight Bearing: Partial weight bearing RLE Partial Weight Bearing Percentage or Pounds: 50      Mobility Bed Mobility Overal bed mobility: Needs Assistance Bed Mobility: Sit to Sidelying;Rolling Rolling: Min assist   Supine to sit: Mod assist;HOB elevated   Sit to sidelying: Mod assist;+2 for physical assistance General bed mobility comments: One person assisting with BLEs, other assisting with trunk. Patient with increased pain during sit>supine transfer.   Transfers Overall transfer level: Needs assistance Equipment used: Rolling walker (2 wheeled) Transfers: Sit to/from [facility] Sit to Stand: Mod assist;+2 physical assistance Stand pivot transfers: Mod assist;+2 physical assistance       General transfer  comment: +2 used for safety at this time. Patient with decreased L knee flexion which makes transfers more difficult. Patient with difficult time following PWB>RLE. Max cueing reqired for sequencing through transfers. Sisters attempted to assist, but unable to cue patient correctly causing increased frusteration between family.     Balance Overall balance assessment: Needs assistance Sitting-balance support: Feet supported;No upper extremity supported Sitting balance-Leahy Scale: Poor Sitting balance - Comments: guarded sitting EOB   Standing balance support: Bilateral upper extremity supported;During functional activity Standing balance-Leahy Scale: Poor Standing balance comment: RW to balance and mod (A) to perform SPT to chair; pt with anterior trunk flexion throughout and greatly guarded due to pain    ADL Overall ADL's : Needs assistance/impaired Eating/Feeding: Set up;Bed level   Grooming: Set up;Sitting (sitting supported)   Upper Body Bathing: Minimal assitance;Sitting (sitting supported)   Lower Body Bathing: Total assistance;+2 for physical assistance;Sit to/from stand;Cueing for safety   Upper Body Dressing : Minimal assistance;Sitting (sitting supported)   Lower Body Dressing: Total assistance;+2 for physical assistance;Sit to/from stand;Cueing for safety   Toilet Transfer: Moderate assistance;RW;BSC;Stand-pivot;+2 for physical assistance   Toileting- Clothing Manipulation and Hygiene: Total assistance;Sit to/from stand;Cueing for safety       Functional mobility during ADLs: +2 for physical assistance General ADL Comments: Patient with increased pain, HOH, decreased ROM > L knee. Patient's sisters present in room and adament that patient discharge>home and not to a SNF. One sister and patient's daughter is a Quar[employer] manager. Therapist attempted to have sisters assist with sit<>stand transfers and stand-pivot transfers, they were unable to safely assist patient at this time. From a  clinical standpoint, for patient's safety, recommend SNF post acute discharge at this time.  Pertinent Vitals/Pain Pain Assessment: 0-10 Pain Score: 8  Pain Location: right hip Pain Descriptors / Indicators: Aching;Constant Pain Intervention(s): Limited activity within patient's tolerance;Monitored during session;Repositioned     Hand Dominance Right   Extremity/Trunk Assessment Upper Extremity Assessment Upper Extremity Assessment: Generalized weakness (Pt with h/o rotator cuff injuty/surgery > RUE)   Lower Extremity Assessment Lower Extremity Assessment: RLE deficits/detail;LLE deficits/detail RLE: Unable to fully assess due to pain RLE Coordination: decreased gross motor LLE Deficits / Details: previoius Lt TKA; flexion limited to 40 degrees   Cervical / Trunk Assessment Cervical / Trunk Assessment: Kyphotic   Communication Communication Communication: HOH   Cognition Arousal/Alertness: Awake/alert Behavior During Therapy: WFL for tasks assessed/performed Overall Cognitive Status: Within Functional Limits for tasks assessed              Home Living Family/patient expects to be discharged to:: Private residence Living Arrangements: Children (daugther who is a Quarry manager) Available Help at Discharge: Family;Available 24 hours/day Type of Home: House Home Access: Stairs to enter [employer] of Steps: 4 Entrance Stairs-Rails: Right Home Layout: One level     Bathroom Shower/Tub: Tub/shower unit;Curtain   Biochemist, clinical: Standard     Home Equipment: Cane - single point;Bedside commode   Additional Comments: son and family with pt 24/7    Prior Functioning/Environment Level of Independence: Independent with assistive device(s)    Comments: ambulated with cane PTA    OT Diagnosis: Generalized weakness;Acute pain   OT Problem List: Decreased strength;Decreased range of motion;Decreased activity tolerance;Impaired balance (sitting and/or  standing);Decreased safety awareness;Decreased knowledge of use of DME or AE;Decreased knowledge of precautions;Pain   OT Treatment/Interventions: Self-care/ADL training;Therapeutic exercise;Energy conservation;DME and/or AE instruction;Therapeutic activities;Patient/family education;Balance training    OT Goals(Current goals can be found in the care plan section) Acute Rehab OT Goals Patient Stated Goal: get better to go home OT Goal Formulation: With patient/family Time For Goal Achievement: 11/30/14 Potential to Achieve Goals: Good ADL Goals Pt Will Perform Grooming: with min guard assist;sitting Pt Will Perform Lower Body Bathing: with mod assist;sit to/from stand;with adaptive equipment Pt Will Perform Lower Body Dressing: with mod assist;sit to/from stand;with adaptive equipment Pt Will Transfer to Toilet: with mod assist;bedside commode;ambulating Pt Will Perform Tub/Shower Transfer: Tub transfer;tub bench;ambulating;with mod assist;rolling walker Additional ADL Goal #1: Patient will independently adhere to 50% partial weight bearing 100% of the time  OT Frequency: Min 2X/week   Barriers to D/C: Inaccessible home environment  Patient with multiple stairs to get into house          End of Session Equipment Utilized During Treatment: Gait belt;Rolling walker  Activity Tolerance: Patient limited by pain Patient left: in bed;with call bell/phone within reach;with family/visitor present   Time: 1151-1222 OT Time Calculation (min): 31 min Charges:  OT General Charges $OT Visit: 1 Procedure OT Evaluation $Initial OT Evaluation Tier I: 1 Procedure OT Treatments $Self Care/Home Management : 8-22 mins  Macaela Presas , MS, OTR/L, CLT Pager: [phone]  11/23/2014, 2:23 PM

## 2014-11-23 NOTE — Care Management Note (Signed)
CARE MANAGEMENT NOTE 11/23/2014  Patient:  DEAIRRA, HALLECK   Account Number:  [id number]  Date Initiated:  11/23/2014  Documentation initiated by:  Ricki Miller  Subjective/Objective Assessment:   74 yr old female admitted with Osteoarthritis of right hip. Patient underwent a right total hip arthroplasty.     Action/Plan:   Patient will need shortterm rehab at [facility]. Social worker is aware.   Anticipated DC Date:  11/25/2014   Anticipated DC Plan:  SKILLED NURSING FACILITY  In-house referral  Clinical Social Worker      DC Planning Services  CM consult      Choice offered to / List presented to:     DME arranged  NA        [place] arranged  NA      Status of service:  Completed, signed off Medicare Important Message given?   (If response is "NO", the following Medicare IM given date fields will be blank) Date Medicare IM given:   Medicare IM given by:   Date Additional Medicare IM given:   Additional Medicare IM given by:    Discharge Disposition:  [place]  Per UR Regulation:  Reviewed for med. necessity/level of care/duration of stay

## 2014-11-23 NOTE — Evaluation (Signed)
Physical Therapy Evaluation Patient Details Name: Peggy Welch MRN: [id number] DOB: 08-13-1941 Today's Date: 11/23/2014   History of Present Illness  Pt is a 74 y.o. female s/p Rt THA  Clinical Impression  Pt is s/p Rt anterior THA POD#1 resulting in the deficits listed below (see PT Problem List). Pt will benefit from skilled PT to increase their independence and safety with mobility to allow discharge to the venue listed below. Pt greatly limited and requiring mod (A) to perform SPT. Pt greatly limited by pain. Discussed rehab goals and recommendation of SNF due to (A) needed and decr progress at this time.      Follow Up Recommendations SNF    Equipment Recommendations  Rolling walker with 5" wheels    Recommendations for Other Services OT consult     Precautions / Restrictions Precautions Precautions: Fall Restrictions Weight Bearing Restrictions: Yes RLE Weight Bearing: Partial weight bearing RLE Partial Weight Bearing Percentage or Pounds: 50      Mobility  Bed Mobility Overal bed mobility: Needs Assistance Bed Mobility: Supine to Sit     Supine to sit: Mod assist;HOB elevated     General bed mobility comments: (A) to bring Rt LE to/off EOB and (A) to elevate trunk; incr time due to pain  Transfers Overall transfer level: Needs assistance Equipment used: Rolling walker (2 wheeled) Transfers: Sit to/from [facility] Sit to Stand: Mod assist;From elevated surface Stand pivot transfers: Mod assist;From elevated surface       General transfer comment: pt very unsteady; max cueing for safety and sequencing; pt with limited ROM in Lt LE making sit to stand transitions difficult while maintaining PWB status on Rt LE  Ambulation/Gait                Stairs            Wheelchair Mobility    Modified Rankin (Stroke Patients Only)       Balance Overall balance assessment: Needs assistance Sitting-balance support: Feet  supported;No upper extremity supported Sitting balance-Leahy Scale: Fair Sitting balance - Comments: guarded sitting EOB   Standing balance support: During functional activity;Bilateral upper extremity supported Standing balance-Leahy Scale: Poor Standing balance comment: RW to balance and mod (A) to perform SPT to chair; pt with anterior trunk flexion throughout and greatly guarded due to pain                             Pertinent Vitals/Pain Pain Assessment: 0-10 Pain Score: 9  Pain Descriptors / Indicators: Aching;Constant;Grimacing Pain Intervention(s): Limited activity within patient's tolerance;Premedicated before session;Repositioned;Heat applied    Home Living Family/patient expects to be discharged to:: Private residence Living Arrangements: Children Available Help at Discharge: Family;Available 24 hours/day Type of Home: House Home Access: Stairs to enter Entrance Stairs-Rails: Right Entrance Stairs-Number of Steps: 4 Home Layout: One level Home Equipment: Cane - single point;Bedside commode Additional Comments: son and family with pt 24/7    Prior Function Level of Independence: Independent with assistive device(s)         Comments: ambulated with cane PTA     Hand Dominance        Extremity/Trunk Assessment   Upper Extremity Assessment: Defer to OT evaluation           Lower Extremity Assessment: RLE deficits/detail;LLE deficits/detail   LLE Deficits / Details: previoius Lt TKA; flexion limited to 40 degrees  Cervical / Trunk Assessment: Kyphotic  Communication  Communication: HOH  Cognition Arousal/Alertness: Awake/alert Behavior During Therapy: WFL for tasks assessed/performed Overall Cognitive Status: Within Functional Limits for tasks assessed                      General Comments General comments (skin integrity, edema, etc.): disussed rehab goals     Exercises Total Joint Exercises Ankle Circles/Pumps:  AROM;Both;10 reps Hip ABduction/ADduction: AAROM;Right;10 reps;Seated Long Arc Quad: AROM;Right;10 reps;Seated      Assessment/Plan    PT Assessment Patient needs continued PT services  PT Diagnosis Difficulty walking;Generalized weakness;Acute pain   PT Problem List Decreased strength;Decreased range of motion;Decreased activity tolerance;Decreased balance;Decreased mobility;Decreased safety awareness;Decreased knowledge of use of DME;Pain;Decreased knowledge of precautions  PT Treatment Interventions DME instruction;Gait training;Stair training;Functional mobility training;Therapeutic activities;Therapeutic exercise;Balance training;Neuromuscular re-education;Patient/family education   PT Goals (Current goals can be found in the Care Plan section) Acute Rehab PT Goals Patient Stated Goal: to get better  PT Goal Formulation: With patient Time For Goal Achievement: 11/30/14 Potential to Achieve Goals: Good    Frequency 7X/week   Barriers to discharge Inaccessible home environment      Co-evaluation               End of Session Equipment Utilized During Treatment: Gait belt Activity Tolerance: Patient limited by pain;Patient limited by fatigue Patient left: in chair;with call bell/phone within reach;with family/visitor present Nurse Communication: Mobility status;Precautions         Time: 0981-1914 PT Time Calculation (min) (ACUTE ONLY): 23 min   Charges:   PT Evaluation $Initial PT Evaluation Tier I: 1 Procedure PT Treatments $Therapeutic Activity: 8-22 mins   PT G CodesGustavus Bryant PT [phone] 11/23/2014, 11:28 AM

## 2014-11-24 ENCOUNTER — Encounter (HOSPITAL_COMMUNITY): Payer: Self-pay | Admitting: Orthopaedic Surgery

## 2014-11-24 DIAGNOSIS — J189 Pneumonia, unspecified organism: Secondary | ICD-10-CM

## 2014-11-24 HISTORY — DX: Pneumonia, unspecified organism: J18.9

## 2014-11-24 LAB — CBC
HEMATOCRIT: 25.8 % — AB (ref 36.0–46.0)
Hemoglobin: 8.6 g/dL — ABNORMAL LOW (ref 12.0–15.0)
MCH: 30.8 pg (ref 26.0–34.0)
MCHC: 33.3 g/dL (ref 30.0–36.0)
MCV: 92.5 fL (ref 78.0–100.0)
Platelets: 224 10*3/uL (ref 150–400)
RBC: 2.79 MIL/uL — AB (ref 3.87–5.11)
RDW: 13.9 % (ref 11.5–15.5)
WBC: 12.1 10*3/uL — AB (ref 4.0–10.5)

## 2014-11-24 MED ORDER — IPRATROPIUM-ALBUTEROL 0.5-2.5 (3) MG/3ML IN SOLN: 3.0000 mL | RESPIRATORY_TRACT | Status: DC | PRN

## 2014-11-24 MED ORDER — OXYCODONE-ACETAMINOPHEN 5-325 MG PO TABS: 1.0000 | ORAL_TABLET | Freq: Four times a day (QID) | ORAL | Status: DC | PRN

## 2014-11-24 MED ORDER — METHOCARBAMOL 500 MG PO TABS: 500.0000 mg | ORAL_TABLET | Freq: Four times a day (QID) | ORAL | Status: DC | PRN

## 2014-11-24 MED ORDER — BISACODYL 10 MG RE SUPP
10.0000 mg | Freq: Once | RECTAL | Status: AC
  Administered 2014-11-24: 10 mg via RECTAL
  Filled 2014-11-24: qty 1

## 2014-11-24 MED ORDER — POLYETHYLENE GLYCOL 3350 17 G PO PACK: 17.0000 g | PACK | Freq: Every day | ORAL | Status: DC

## 2014-11-24 MED ORDER — ASPIRIN EC 325 MG PO TBEC: 325.0000 mg | DELAYED_RELEASE_TABLET | Freq: Two times a day (BID) | ORAL | Status: DC

## 2014-11-24 NOTE — Clinical Social Work Note (Signed)
CSW spoke with patient and daughter, Marcie Bal at bedside.  Both state that patient will be going home with home health PT once medically discharged.  Marcie Bal states she will be staying with patient and providing 24 hour supervision.  RNCM  Consulted.  Nonnie Done, [place] [phone]  Psychiatric & Orthopedics (5N 1-8) Clinical Social Worker

## 2014-11-24 NOTE — Progress Notes (Signed)
Physical Therapy Treatment Patient Details Name: Peggy Welch MRN: [id number] DOB: 04-03-1941 Today's Date: 11/24/2014    History of Present Illness Pt is a 74 y.o. female s/p Rt THA    PT Comments    Pt requiring 2 person (A) for safety at this time. Family insists on taking pt home. PT continues to recommend SNF for post acute rehab. If pt refuses SNF; pt will require HHPT and 24/7 (A). Pt is a high fall risk.   Follow Up Recommendations  SNF     Equipment Recommendations  Rolling walker with 5" wheels    Recommendations for Other Services       Precautions / Restrictions Precautions Precautions: Fall Restrictions Weight Bearing Restrictions: Yes RLE Weight Bearing: Partial weight bearing RLE Partial Weight Bearing Percentage or Pounds: 50    Mobility  Bed Mobility Overal bed mobility: Needs Assistance Bed Mobility: Supine to Sit     Supine to sit: Mod assist;HOB elevated     General bed mobility comments: (A) to bring Rt LE to/of EOB; incr time due to pain and handheld (A) to elevate trunk to sitting;pt heavily leaning to Lt and relying on handrails  Transfers Overall transfer level: Needs assistance   Transfers: Sit to/from Stand Sit to Stand: Mod assist;+2 physical assistance         General transfer comment: cues for hand placement and safety; pt with fwd flex trunk throughout and requires intermittent hand over hand (A) for hand positioning on RW; pt unsteady with transfers  Ambulation/Gait Ambulation/Gait assistance: +2 physical assistance;Min assist Ambulation Distance (Feet): 18 Feet (12', 6' ) Assistive device: Rolling walker (2 wheeled) Gait Pattern/deviations: Step-to pattern;Shuffle;Decreased stride length;Decreased stance time - right;Decreased step length - left;Trunk flexed;Wide base of support Gait velocity: decr Gait velocity interpretation: Below normal speed for age/gender General Gait Details: pt requiring max cues for sequencing and  safety; pt with tendency to lean anteriorly onto RW; fatigues quickly when ambulating to bathroom; requires multiple standing rest breaks; c/o dizziness during 2nd trial and required chair to be brought to her for safety; 2 person for safety; daughter present throughout session   Stairs            Wheelchair Mobility    Modified Rankin (Stroke Patients Only)       Balance Overall balance assessment: Needs assistance Sitting-balance support: Feet supported;Single extremity supported;Bilateral upper extremity supported Sitting balance-Leahy Scale: Poor Sitting balance - Comments: heavy lean to Lt; requiring UE support at all times Postural control: Posterior lean;Left lateral lean Standing balance support: Bilateral upper extremity supported;During functional activity Standing balance-Leahy Scale: Poor Standing balance comment: RW at all times and max (A) for pericare in bathroom                    Cognition Arousal/Alertness: Awake/alert Behavior During Therapy: WFL for tasks assessed/performed Overall Cognitive Status: Within Functional Limits for tasks assessed                      Exercises Total Joint Exercises Ankle Circles/Pumps: AROM;Both;10 reps Long Arc Quad: AROM;Right;10 reps;Seated    General Comments        Pertinent Vitals/Pain Pain Assessment: 0-10 Pain Score: 7  Pain Location: Rt hip Pain Descriptors / Indicators: Constant;Aching;Grimacing Pain Intervention(s): Monitored during session;Premedicated before session;Repositioned    Home Living                      Prior Function  PT Goals (current goals can now be found in the care plan section) Acute Rehab PT Goals Patient Stated Goal: to use the bathroom  PT Goal Formulation: With patient Time For Goal Achievement: 11/30/14 Potential to Achieve Goals: Good Progress towards PT goals: Progressing toward goals    Frequency  7X/week    PT Plan Current  plan remains appropriate    Co-evaluation             End of Session Equipment Utilized During Treatment: Gait belt Activity Tolerance: Patient limited by fatigue;Patient limited by pain Patient left: in chair;with call bell/phone within reach;with family/visitor present     Time: 4715-9539 PT Time Calculation (min) (ACUTE ONLY): 23 min  Charges:  $Gait Training: 8-22 mins $Therapeutic Activity: 8-22 mins                    G CodesGustavus Bryant PT [phone] 11/24/2014, 10:06 AM

## 2014-11-24 NOTE — Progress Notes (Addendum)
Subjective: 2 Days Post-Op Procedure(s) (LRB): TOTAL HIP ARTHROPLASTY ANTERIOR APPROACH (Right) Patient reports pain as moderate.    Objective: Vital signs in last 24 hours: Temp:  [99.7 F (37.6 C)-102.8 F (39.3 C)] 99.7 F (37.6 C) (04/01 0438) Pulse Rate:  [92-110] 95 (04/01 0438) Resp:  [16-18] 16 (04/01 0438) BP: (110-126)/(58-66) 110/58 mmHg (04/01 0438) SpO2:  [93 %-94 %] 94 % (04/01 0438)  Intake/Output from previous day: 03/31 0701 - 04/01 0700 In: 1380 [P.O.:480; I.V.:900] Out: -  Intake/Output this shift:     Recent Labs  11/23/14 0808 11/24/14 0549  HGB 9.8* 8.6*    Recent Labs  11/23/14 0808 11/24/14 0549  WBC 10.9* 12.1*  RBC 3.09* 2.79*  HCT 29.1* 25.8*  PLT 273 224    Recent Labs  11/23/14 0808  NA 135  K 3.0*  CL 97  CO2 30  BUN 12  CREATININE 0.95  GLUCOSE 121*  CALCIUM 8.2*   No results for input(s): LABPT, INR in the last 72 hours.  Neurologically intact  Assessment/Plan: 2 Days Post-Op Procedure(s) (LRB): TOTAL HIP ARTHROPLASTY ANTERIOR APPROACH (Right) Up with therapy possible home Saturday more likely Sunday. Daughter CNA .  Sukhman Kocher C 11/24/2014, 7:48 AM

## 2014-11-25 LAB — CBC
HCT: 27.5 % — ABNORMAL LOW (ref 36.0–46.0)
HEMOGLOBIN: 9.1 g/dL — AB (ref 12.0–15.0)
MCH: 31 pg (ref 26.0–34.0)
MCHC: 33.1 g/dL (ref 30.0–36.0)
MCV: 93.5 fL (ref 78.0–100.0)
PLATELETS: 248 10*3/uL (ref 150–400)
RBC: 2.94 MIL/uL — AB (ref 3.87–5.11)
RDW: 13.7 % (ref 11.5–15.5)
WBC: 14.2 10*3/uL — ABNORMAL HIGH (ref 4.0–10.5)

## 2014-11-25 NOTE — Progress Notes (Signed)
Patient is stable from ortho standpoint.  Up with PT today.  Plan is dc to SNF today.  Rx in chart.  Azucena Cecil, MD [place] 8:07 AM

## 2014-11-25 NOTE — Care Management Note (Addendum)
CARE MANAGEMENT NOTE 11/25/2014  Patient:  JIHAN, MELLETTE   Account Number:  [id number]  Date Initiated:  11/23/2014  Documentation initiated by:  Ricki Miller  Subjective/Objective Assessment:   74 yr old female admitted with Osteoarthritis of right hip. Patient underwent a right total hip arthroplasty.     Action/Plan:   Patient will need shortterm rehab at [facility]. Social worker is aware.   Anticipated DC Date:  11/25/2014   Anticipated DC Plan:  SKILLED NURSING FACILITY  In-house referral  Clinical Social Worker      DC Planning Services  CM consult      Choice offered to / List presented to:     DME arranged  NA        [place] arranged  NA      Status of service:  Completed, signed off Medicare Important Message given?   CARE MANAGEMENT NOTE 11/25/2014  Patient:  TANIAH, REINECKE   Account Number:  [id number]  Date Initiated:  11/23/2014  Documentation initiated by:  Ricki Miller  Subjective/Objective Assessment:   74 yr old female admitted with Osteoarthritis of right hip. Patient underwent a right total hip arthroplasty.     Action/Plan:   Patient will need shortterm rehab at [facility]. Social worker is aware.   Anticipated DC Date:  11/25/2014   Anticipated DC Plan:  SKILLED NURSING FACILITY  In-house referral  Clinical Social Worker      DC Forensic scientist  CM consult      PAC Choice  [place]   Choice offered to / List presented to:  C-1 Patient   DME arranged  Vassie Moselle      DME agency  [place] arranged  [place]   Status of service:  Completed, signed off Medicare Important Message given?   (If response is "NO", the following Medicare IM given date fields will be blank) Date Medicare IM given:   Medicare IM given by:   Date Additional Medicare IM given:  11/25/2014 Additional Medicare IM given by:  Norina Buzzard  Discharge Disposition:   [place]  Per UR Regulation:  Reviewed for med. necessity/level of care/duration of stay  If discussed at [place] of Stay Meetings, dates discussed:    Comments:  11/25/14  10:00 - [name], RN, BSN  Per SW note, d/c plan is to return home with the support of her daughter. Met with pt and sister Joycelyn Schmid) to discuss d/c plan. D/C plan is to return home with the support of her daughter, granddaughter and sister 24/7. Pt has an elevated toilet seat. She needs a RW. Provided them with a list of [place] agencies. Her sister used [facility] in the past when she had a knee replacement. They prefer to use [facility] for HHPT and agreed to use [facility] for DME. Contacted Butch Penny at [facility] and Jeneen Rinks at [facility] for referrals.

## 2014-11-25 NOTE — Progress Notes (Signed)
Occupational Therapy Treatment Patient Details Name: ZENDA HERSKOWITZ MRN: [id number] DOB: 24-Sep-1940 Today's Date: 11/25/2014    History of present illness Pt is a 74 y.o. female s/p Rt THA   OT comments  Pt is progressing with mobility and requires min A +2 for transfers. Family education completed on assisting pt with mobility including bed mobility and transfers. Family plans to assist with LB ADLs and educated on use of 3N1 beside bed and sponge bathing only for safety. Pt plans to d/c home today and feel that family is adequately prepared to safely assist.    Follow Up Recommendations  Home health OT;Supervision/Assistance - 24 hour    Equipment Recommendations  None recommended by OT    Recommendations for Other Services      Precautions / Restrictions Precautions Precautions: Fall Restrictions Weight Bearing Restrictions: Yes RLE Weight Bearing: Partial weight bearing RLE Partial Weight Bearing Percentage or Pounds: 50       Mobility Bed Mobility Overal bed mobility: Needs Assistance Bed Mobility: Supine to Sit     Supine to sit: Min assist     General bed mobility comments: VC's for sequencing. HOB flat and rails down to simulate home setup. Family assisted with progressing RLE to EOB as demonstrated by OT. Demonstrated hand held assist to help pt sit up at [facility] and family reports understanding.   Transfers Overall transfer level: Needs assistance Equipment used: Rolling walker (2 wheeled) Transfers: Sit to/from Stand Sit to Stand: Min assist;+2 physical assistance         General transfer comment: Pt progressing with mobility and required min A +2 to stand with increased time and VC's for sequencing. Family demonstrated good use of VC's to assist pt.         ADL Overall ADL's : Needs assistance/impaired Eating/Feeding: Set up;Sitting   Grooming: Set up;Sitting       Lower Body Bathing: Sit to/from stand;+2 for physical assistance;Moderate  assistance       Lower Body Dressing: Maximal assistance;+2 for physical assistance;Sit to/from stand   Toilet Transfer: Minimal assistance;+2 for physical assistance;Ambulation;RW           Functional mobility during ADLs: Minimal assistance;+2 for physical assistance;Rolling walker General ADL Comments: Simulated home environment to prepare pt to d/c home today per her request. Pt required VC's and min A for bed mobility and family participated in education on assisting pt with bed mobility. Pt stood from EOB and ambulated to recliner chair, then practiced sit<>stand with family assistance. Family appears safe in assisting pt out of bed and to bathroom. Educated on use of 3N1 beside bed at night and sponge bathing only at this time. Family will assist with LB ADLs.                 Cognition  Arousal/Alertness: Awake/Alert Behavior During Therapy: WFL for tasks assessed/performed Overall Cognitive Status: Within Functional Limits for tasks assessed                                    Pertinent Vitals/ Pain       Pain Assessment: 0-10 Pain Score: 4  Pain Location: Rt hip Pain Descriptors / Indicators: Constant;Aching Pain Intervention(s): Limited activity within patient's tolerance;Monitored during session;Repositioned         Frequency Min 2X/week     Progress Toward Goals  OT Goals(current goals can now be found in the care plan  section)  Progress towards OT goals: Progressing toward goals  Acute Rehab OT Goals Patient Stated Goal: to go home today  Plan Discharge plan needs to be updated       End of Session Equipment Utilized During Treatment: Rolling walker   Activity Tolerance Patient tolerated treatment well   Patient Left in chair;with call bell/phone within reach;with family/visitor present   Nurse Communication          Time: 1013-1030 OT Time Calculation (min): 17 min  Charges: OT General Charges $OT Visit: 1 Procedure OT  Treatments $Self Care/Home Management : 8-22 mins  Villa Herb M 11/25/2014, 11:44 AM  Cyndie Chime, OTR/L Occupational Therapist [phone] (pager)

## 2014-11-25 NOTE — Progress Notes (Signed)
Physical Therapy Treatment Patient Details Name: Peggy Welch MRN: [id number] DOB: Mar 22, 1941 Today's Date: 11-28-2014    History of Present Illness Pt is a 74 y.o. female s/p Rt THA    PT Comments    Pt making great progress with mobility today.  Follow Up Recommendations  SNF     Equipment Recommendations  Rolling walker with 5" wheels    Recommendations for Other Services OT consult     Precautions / Restrictions Precautions Precautions: Fall Restrictions Weight Bearing Restrictions: Yes RLE Weight Bearing: Partial weight bearing RLE Partial Weight Bearing Percentage or Pounds: 50    Mobility  Bed Mobility Overal bed mobility: Needs Assistance Bed Mobility: Supine to Sit     Supine to sit: Min assist     General bed mobility comments: pt in recliner before and after session  Transfers Overall transfer level: Needs assistance Equipment used: Rolling walker (2 wheeled) Transfers: Sit to/from Stand Sit to Stand: Min guard         General transfer comment: increased time and cues on hand placement x 2 reps  Ambulation/Gait Ambulation/Gait assistance: Min guard Ambulation Distance (Feet): 20 Feet (x 2 reps) Assistive device: Rolling walker (2 wheeled) Gait Pattern/deviations: Step-through pattern;Decreased stride length;Trunk flexed;Wide base of support Gait velocity: decr Gait velocity interpretation: Below normal speed for age/gender General Gait Details: cues on sequence,    Stairs Stairs: Yes Stairs assistance: Min assist Stair Management: Backwards;One rail Right;Step to pattern Number of Stairs: 3 General stair comments: hand held assist on side opposite the right rail, cues on sequence and posture. Pt's family present and educated as well.  Wheelchair Mobility    Modified Rankin (Stroke Patients Only)       Cognition Arousal/Alertness: Awake/alert Behavior During Therapy: WFL for tasks assessed/performed Overall Cognitive Status:  Within Functional Limits for tasks assessed          Exercises Total Joint Exercises Ankle Circles/Pumps: AROM;Both;10 reps;Seated Quad Sets: AROM;Strengthening;Right;10 reps;Seated Heel Slides: AAROM;Strengthening;Right;10 reps;Seated     Pertinent Vitals/Pain Pain Assessment: 0-10 Pain Score: 4  Pain Location: right hip Pain Descriptors / Indicators: Constant;Aching Pain Intervention(s): Monitored during session;Premedicated before session;Repositioned     PT Goals (current goals can now be found in the care plan section) Acute Rehab PT Goals Patient Stated Goal: to go home today PT Goal Formulation: With patient Time For Goal Achievement: 11/30/14 Potential to Achieve Goals: Good Progress towards PT goals: Progressing toward goals    Frequency  7X/week    PT Plan Current plan remains appropriate       End of Session Equipment Utilized During Treatment: Gait belt Activity Tolerance: Patient tolerated treatment well Patient left: in chair;with call bell/phone within reach;with family/visitor present     Time: 1152-1220 PT Time Calculation (min) (ACUTE ONLY): 28 min  Charges:  $Gait Training: 8-22 mins $Therapeutic Exercise: 8-22 mins                    G Codes:      Willow Ora 11-28-2014, 1:26 PM   Willow Ora, PTA, [place] [address], [place], [place] [address] [phone] 2014/11/28, 1:27 PM

## 2014-11-25 NOTE — Care Management Note (Signed)
CARE MANAGEMENT NOTE 11/25/2014  Patient:  Peggy Welch, Peggy Welch   Account Number:  [id number]  Date Initiated:  11/23/2014  Documentation initiated by:  Ricki Miller  Subjective/Objective Assessment:   74 yr old female admitted with Osteoarthritis of right hip. Patient underwent a right total hip arthroplasty.     Action/Plan:   Patient will need shortterm rehab at [facility]. Social worker is aware.   Anticipated DC Date:  11/25/2014   Anticipated DC Plan:  SKILLED NURSING FACILITY  In-house referral  Clinical Social Worker      DC Forensic scientist  CM consult      PAC Choice  [place]   Choice offered to / List presented to:  C-1 Patient   DME arranged  Vassie Moselle      DME agency  [place] arranged  [place]   Status of service:  Completed, signed off Medicare Important Message given?   (If response is "NO", the following Medicare IM given date fields will be blank) Date Medicare IM given:   Medicare IM given by:   Date Additional Medicare IM given:  11/25/2014 Additional Medicare IM given by:  Norina Buzzard  Discharge Disposition:  [place]  Per UR Regulation:  Reviewed for med. necessity/level of care/duration of stay  If discussed at [place] of Stay Meetings, dates discussed:    Comments:  11/25/14 1300 - [name], RN, BSN  Met with pt, daughter Peggy Welch) and sister. Peggy Welch stated that her other aunt recommended [facility] for [place]. She is also asking for a W/C. [place] at [phone], spoke with Stacy Gardner, she stated that their therapist don't work on _0 /02/16  10:00 - [name], RN, BSN  Per SW note, d/c plan is to return home with the support of her daughter. Met with pt and sister Peggy Welch) to discuss d/c plan. D/C plan is to return home with the support of her daughter, granddaughter and sister 24/7. Pt has an elevated toilet seat. She needs a RW. Provided them with a list of [place] agencies. Her sister used [facility] in the past when she had a knee replacement. They prefer to use [facility] for HHPT and agreed to use [facility] for DME. Contacted Butch Penny at [facility] and Jeneen Rinks at [facility] for referrals.

## 2014-11-25 NOTE — Discharge Instructions (Signed)

## 2014-11-30 ENCOUNTER — Inpatient Hospital Stay (HOSPITAL_COMMUNITY)
Admission: EM | Admit: 2014-11-30 | Discharge: 2014-12-06 | DRG: 871 | Disposition: A | Discharge status: Home / Self Care | Payer: Medicare (Managed Care) | Attending: Internal Medicine | Admitting: Internal Medicine

## 2014-11-30 ENCOUNTER — Encounter (HOSPITAL_COMMUNITY): Payer: Self-pay | Admitting: Emergency Medicine

## 2014-11-30 ENCOUNTER — Emergency Department (HOSPITAL_COMMUNITY): Payer: Medicare (Managed Care)

## 2014-11-30 DIAGNOSIS — Z96653 Presence of artificial knee joint, bilateral: Secondary | ICD-10-CM | POA: Diagnosis present

## 2014-11-30 DIAGNOSIS — Z7982 Long term (current) use of aspirin: Secondary | ICD-10-CM | POA: Diagnosis not present

## 2014-11-30 DIAGNOSIS — R509 Fever, unspecified: Secondary | ICD-10-CM | POA: Diagnosis present

## 2014-11-30 DIAGNOSIS — Z96649 Presence of unspecified artificial hip joint: Secondary | ICD-10-CM

## 2014-11-30 DIAGNOSIS — I10 Essential (primary) hypertension: Secondary | ICD-10-CM | POA: Diagnosis present

## 2014-11-30 DIAGNOSIS — A419 Sepsis, unspecified organism: Principal | ICD-10-CM | POA: Diagnosis present

## 2014-11-30 DIAGNOSIS — D509 Iron deficiency anemia, unspecified: Secondary | ICD-10-CM | POA: Diagnosis present

## 2014-11-30 DIAGNOSIS — R040 Epistaxis: Secondary | ICD-10-CM | POA: Diagnosis present

## 2014-11-30 DIAGNOSIS — D62 Acute posthemorrhagic anemia: Secondary | ICD-10-CM | POA: Diagnosis present

## 2014-11-30 DIAGNOSIS — J181 Lobar pneumonia, unspecified organism: Secondary | ICD-10-CM | POA: Diagnosis present

## 2014-11-30 DIAGNOSIS — E876 Hypokalemia: Secondary | ICD-10-CM | POA: Diagnosis present

## 2014-11-30 DIAGNOSIS — J45909 Unspecified asthma, uncomplicated: Secondary | ICD-10-CM | POA: Diagnosis present

## 2014-11-30 DIAGNOSIS — Z9049 Acquired absence of other specified parts of digestive tract: Secondary | ICD-10-CM | POA: Diagnosis present

## 2014-11-30 DIAGNOSIS — Z96641 Presence of right artificial hip joint: Secondary | ICD-10-CM | POA: Diagnosis present

## 2014-11-30 DIAGNOSIS — J189 Pneumonia, unspecified organism: Secondary | ICD-10-CM | POA: Diagnosis present

## 2014-11-30 DIAGNOSIS — R05 Cough: Secondary | ICD-10-CM | POA: Diagnosis not present

## 2014-11-30 DIAGNOSIS — M1990 Unspecified osteoarthritis, unspecified site: Secondary | ICD-10-CM | POA: Diagnosis present

## 2014-11-30 DIAGNOSIS — D72829 Elevated white blood cell count, unspecified: Secondary | ICD-10-CM | POA: Diagnosis not present

## 2014-11-30 DIAGNOSIS — R059 Cough, unspecified: Secondary | ICD-10-CM | POA: Diagnosis present

## 2014-11-30 DIAGNOSIS — T502X5A Adverse effect of carbonic-anhydrase inhibitors, benzothiadiazides and other diuretics, initial encounter: Secondary | ICD-10-CM | POA: Diagnosis present

## 2014-11-30 HISTORY — DX: Unspecified asthma with (acute) exacerbation: J45.901

## 2014-11-30 LAB — COMPREHENSIVE METABOLIC PANEL
ALT: 20 U/L (ref 0–35)
AST: 29 U/L (ref 0–37)
Albumin: 3 g/dL — ABNORMAL LOW (ref 3.5–5.2)
Alkaline Phosphatase: 54 U/L (ref 39–117)
Anion gap: 9 (ref 5–15)
BUN: 14 mg/dL (ref 6–23)
CO2: 29 mmol/L (ref 19–32)
CREATININE: 0.7 mg/dL (ref 0.50–1.10)
Calcium: 8.2 mg/dL — ABNORMAL LOW (ref 8.4–10.5)
Chloride: 96 mmol/L (ref 96–112)
GFR calc Af Amer: >90 mL/min (ref >90)
GFR, EST NON AFRICAN AMERICAN: 83 mL/min — AB (ref >90)
GLUCOSE: 116 mg/dL — AB (ref 70–99)
Potassium: 3.1 mmol/L — ABNORMAL LOW (ref 3.5–5.1)
SODIUM: 134 mmol/L — AB (ref 135–145)
Total Bilirubin: 0.9 mg/dL (ref 0.3–1.2)
Total Protein: 7 g/dL (ref 6.0–8.3)

## 2014-11-30 LAB — CBC WITH DIFFERENTIAL/PLATELET
BASOS PCT: 0 % (ref 0–1)
Basophils Absolute: 0.1 10*3/uL (ref 0.0–0.1)
EOS ABS: 0.6 10*3/uL (ref 0.0–0.7)
Eosinophils Relative: 3 % (ref 0–5)
HCT: 28 % — ABNORMAL LOW (ref 36.0–46.0)
HEMOGLOBIN: 8.8 g/dL — AB (ref 12.0–15.0)
LYMPHS ABS: 3.3 10*3/uL (ref 0.7–4.0)
Lymphocytes Relative: 16 % (ref 12–46)
MCH: 29.7 pg (ref 26.0–34.0)
MCHC: 31.4 g/dL (ref 30.0–36.0)
MCV: 94.6 fL (ref 78.0–100.0)
MONO ABS: 1.7 10*3/uL — AB (ref 0.1–1.0)
MONOS PCT: 8 % (ref 3–12)
Neutro Abs: 15.1 10*3/uL — ABNORMAL HIGH (ref 1.7–7.7)
Neutrophils Relative %: 73 % (ref 43–77)
Platelets: 464 10*3/uL — ABNORMAL HIGH (ref 150–400)
RBC: 2.96 MIL/uL — AB (ref 3.87–5.11)
RDW: 14.2 % (ref 11.5–15.5)
WBC: 20.8 10*3/uL — ABNORMAL HIGH (ref 4.0–10.5)

## 2014-11-30 LAB — I-STAT CG4 LACTIC ACID, ED: LACTIC ACID, VENOUS: 1.04 mmol/L (ref 0.5–2.0)

## 2014-11-30 MED ORDER — ACETAMINOPHEN 325 MG PO TABS: 650.0000 mg | ORAL_TABLET | Freq: Four times a day (QID) | ORAL | Status: DC | PRN

## 2014-11-30 MED ORDER — POLYETHYLENE GLYCOL 3350 17 G PO PACK
17.0000 g | PACK | Freq: Every day | ORAL | Status: DC
  Administered 2014-11-30 – 2014-12-01 (×2): 17 g via ORAL
  Filled 2014-11-30 (×4): qty 1

## 2014-11-30 MED ORDER — ACETAMINOPHEN 325 MG PO TABS
650.0000 mg | ORAL_TABLET | Freq: Four times a day (QID) | ORAL | Status: DC | PRN
  Administered 2014-12-01 – 2014-12-06 (×10): 650 mg via ORAL
  Filled 2014-11-30 (×12): qty 2

## 2014-11-30 MED ORDER — IBUPROFEN 800 MG PO TABS
800.0000 mg | ORAL_TABLET | Freq: Once | ORAL | Status: AC
  Administered 2014-11-30: 800 mg via ORAL
  Filled 2014-11-30: qty 1

## 2014-11-30 MED ORDER — VANCOMYCIN HCL 10 G IV SOLR
1500.0000 mg | Freq: Once | INTRAVENOUS | Status: AC
  Administered 2014-11-30: 1500 mg via INTRAVENOUS
  Filled 2014-11-30: qty 1500

## 2014-11-30 MED ORDER — DEXTROSE 5 % IV SOLN
1.0000 g | Freq: Once | INTRAVENOUS | Status: AC
  Administered 2014-11-30: 1 g via INTRAVENOUS
  Filled 2014-11-30 (×2): qty 1

## 2014-11-30 MED ORDER — DEXTROSE 5 % IV SOLN
1.0000 g | Freq: Three times a day (TID) | INTRAVENOUS | Status: DC
  Administered 2014-12-01 – 2014-12-06 (×16): 1 g via INTRAVENOUS
  Filled 2014-11-30 (×16): qty 1

## 2014-11-30 MED ORDER — SODIUM CHLORIDE 0.9 % IV BOLUS (SEPSIS)
30.0000 mL/kg | Freq: Once | INTRAVENOUS | Status: AC
  Administered 2014-11-30: 2000 mL via INTRAVENOUS

## 2014-11-30 MED ORDER — HEPARIN SODIUM (PORCINE) 5000 UNIT/ML IJ SOLN
5000.0000 [IU] | Freq: Three times a day (TID) | INTRAMUSCULAR | Status: DC
  Administered 2014-11-30 – 2014-12-04 (×11): 5000 [IU] via SUBCUTANEOUS
  Filled 2014-11-30 (×12): qty 1

## 2014-11-30 MED ORDER — OXYCODONE-ACETAMINOPHEN 5-325 MG PO TABS
1.0000 | ORAL_TABLET | Freq: Four times a day (QID) | ORAL | Status: DC | PRN
  Administered 2014-12-01 – 2014-12-04 (×6): 1 via ORAL
  Filled 2014-11-30 (×7): qty 1

## 2014-11-30 MED ORDER — METHOCARBAMOL 500 MG PO TABS
500.0000 mg | ORAL_TABLET | Freq: Four times a day (QID) | ORAL | Status: DC | PRN
  Administered 2014-12-04: 500 mg via ORAL
  Filled 2014-11-30: qty 1

## 2014-11-30 MED ORDER — SODIUM CHLORIDE 0.9 % IV SOLN: INTRAVENOUS | Status: DC

## 2014-11-30 MED ORDER — VANCOMYCIN HCL IN DEXTROSE 750-5 MG/150ML-% IV SOLN
750.0000 mg | Freq: Two times a day (BID) | INTRAVENOUS | Status: DC
  Administered 2014-12-01 – 2014-12-02 (×4): 750 mg via INTRAVENOUS
  Filled 2014-11-30 (×5): qty 150

## 2014-11-30 MED ORDER — VANCOMYCIN HCL IN DEXTROSE 750-5 MG/150ML-% IV SOLN
750.0000 mg | Freq: Two times a day (BID) | INTRAVENOUS | Status: DC
  Filled 2014-11-30: qty 150

## 2014-11-30 MED ORDER — ASPIRIN EC 325 MG PO TBEC
325.0000 mg | DELAYED_RELEASE_TABLET | Freq: Two times a day (BID) | ORAL | Status: DC
  Administered 2014-12-01: 325 mg via ORAL
  Filled 2014-11-30 (×2): qty 1

## 2014-11-30 MED ORDER — ALBUTEROL SULFATE (2.5 MG/3ML) 0.083% IN NEBU
2.5000 mg | INHALATION_SOLUTION | RESPIRATORY_TRACT | Status: DC | PRN
  Administered 2014-12-02 – 2014-12-05 (×3): 2.5 mg via RESPIRATORY_TRACT
  Filled 2014-11-30 (×3): qty 3

## 2014-11-30 MED ORDER — SODIUM CHLORIDE 0.9 % IV BOLUS (SEPSIS)
1000.0000 mL | Freq: Once | INTRAVENOUS | Status: AC
  Administered 2014-11-30: 1000 mL via INTRAVENOUS

## 2014-11-30 NOTE — H&P (Signed)
[facility] History and Physical  Peggy Welch TOI:[id number] DOB: [birth year] DOA: 11/30/2014  Referring physician: EDP PCP: Elyn Peers, MD   Chief Complaint: Fever   HPI: Peggy Welch is a 74 y.o. female who presents to the ED for evaluation of cough and fever.  Patient is s/p THA done 8 days ago.  Patient has actually had cough since just before the surgery per family, saw doctor before surgery, was put "on a pill" and surgery went ahead as planned.  Over the past week she has had increasingly worsening cough, productive of clear sputum, and today patient developed fever of 102.0 at home.  Review of Systems: Systems reviewed.  As above, otherwise negative  Past Medical History  Diagnosis Date  . Asthma   . Hypertension   . Hepatitis C     from blood transfusion  . Arthritis    Past Surgical History  Procedure Laterality Date  . Joint replacement      Bilateral total knee replacements  . Cholecystectomy  02/21/2002  . Right rotator cuff repair  08/12/2006  . 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 Total hip arthroplasty Right 11/22/2014    Procedure: TOTAL HIP ARTHROPLASTY ANTERIOR APPROACH;  Surgeon: Marybelle Killings, MD;  Location: [place];  Service: Orthopedics;  Laterality: Right;   Social History:  reports that she has never smoked. She has never used smokeless tobacco. She reports that she does not drink alcohol or use illicit drugs.  No Known Allergies  History reviewed. No pertinent family history.   Prior to Admission medications   Medication Sig Start Date End Date Taking? Authorizing Provider  acetaminophen (TYLENOL) 650 MG CR tablet Take 650 mg by mouth every 8 (eight) hours as needed for pain (pain).   Yes Historical Provider, MD  albuterol (PROVENTIL) (2.5 MG/3ML) 0.083% nebulizer solution Take 2.5 mg by nebulization every 4 (four) hours as needed for wheezing or shortness of breath (wheezing).   Yes Historical Provider, MD  aspirin EC  325 MG tablet Take 1 tablet (325 mg total) by mouth 2 (two) times daily. 11/24/14  Yes Naiping Ephriam Jenkins, MD  B Complex-C (B-COMPLEX WITH VITAMIN C) tablet Take 1 tablet by mouth daily.   Yes Historical Provider, MD  hydrochlorothiazide (HYDRODIURIL) 25 MG tablet Take 25 mg by mouth daily.   Yes Historical Provider, MD  methocarbamol (ROBAXIN) 500 MG tablet Take 1 tablet (500 mg total) by mouth every 6 (six) hours as needed for muscle spasms. 11/24/14  Yes Lanae Crumbly, PA-C  oxyCODONE-acetaminophen (ROXICET) 5-325 MG per tablet Take 1 tablet by mouth every 6 (six) hours as needed for severe pain. 11/24/14  Yes Lanae Crumbly, PA-C  potassium chloride SA (K-DUR,KLOR-CON) 20 MEQ tablet Take 20 mEq by mouth daily. 10/25/14  Yes Historical Provider, MD  alendronate (FOSAMAX) 70 MG tablet Take 70 mg by mouth every 7 (seven) days. On Tuesdays. Take with a full glass of water on an empty stomach.    Historical Provider, MD  polyethylene glycol (MIRALAX / GLYCOLAX) packet Take 17 g by mouth daily. 11/24/14   Lanae Crumbly, PA-C   Physical Exam: Filed Vitals:   11/30/14 2047  BP: 126/64  Pulse: 103  Temp: 99.6 F (37.6 C)  Resp: 18    BP 126/64 mmHg  Pulse 103  Temp(Src) 99.6 F (37.6 C) (Oral)  Resp 18  Ht 5\' 4"  (1.626 m)  Wt 86.183 kg (190 lb)  BMI  32.60 kg/m2  SpO2 92%  General Appearance:    Alert, oriented, no distress, appears stated age  Head:    Normocephalic, atraumatic  Eyes:    PERRL, EOMI, sclera non-icteric        Nose:   Nares without drainage or epistaxis. Mucosa, turbinates normal  Throat:   Moist mucous membranes. Oropharynx without erythema or exudate.  Neck:   Supple. No carotid bruits.  No thyromegaly.  No lymphadenopathy.   Back:     No CVA tenderness, no spinal tenderness  Lungs:     RLL rales > LLL rales  Chest wall:    No tenderness to palpitation  Heart:    Regular rate and rhythm without murmurs, gallops, rubs  Abdomen:     Soft, non-tender, nondistended, normal bowel  sounds, no organomegaly  Genitalia:    deferred  Rectal:    deferred  Extremities:   No clubbing, cyanosis or edema.  Pulses:   2+ and symmetric all extremities  Skin:   Skin color, texture, turgor normal, no rashes or lesions, right hip surgical wound appears to be healing, no erythema, warmth, or overt evidence of infection.  Lymph nodes:   Cervical, supraclavicular, and axillary nodes normal  Neurologic:   CNII-XII intact. Normal strength, sensation and reflexes      throughout    Labs on Admission:  Basic Metabolic Panel:  Recent Labs Lab 11/30/14 1913  NA 134*  K 3.1*  CL 96  CO2 29  GLUCOSE 116*  BUN 14  CREATININE 0.70  CALCIUM 8.2*   Liver Function Tests:  Recent Labs Lab 11/30/14 1913  AST 29  ALT 20  ALKPHOS 54  BILITOT 0.9  PROT 7.0  ALBUMIN 3.0*   No results for input(s): LIPASE, AMYLASE in the last 168 hours. No results for input(s): AMMONIA in the last 168 hours. CBC:  Recent Labs Lab 11/24/14 0549 11/25/14 0501 11/30/14 1913  WBC 12.1* 14.2* 20.8*  NEUTROABS  --   --  15.1*  HGB 8.6* 9.1* 8.8*  HCT 25.8* 27.5* 28.0*  MCV 92.5 93.5 94.6  PLT 224 248 464*   Cardiac Enzymes: No results for input(s): CKTOTAL, CKMB, CKMBINDEX, TROPONINI in the last 168 hours.  BNP (last 3 results) No results for input(s): PROBNP in the last 8760 hours. CBG: No results for input(s): GLUCAP in the last 168 hours.  Radiological Exams on Admission: Dg Chest 2 View  11/30/2014   CLINICAL DATA:  74 year old female with fever and cough for 4 days. Initial encounter. Chronic hepatitis-C.  EXAM: CHEST  2 VIEW  COMPARISON:  11/10/2014 and earlier.  FINDINGS: Semi upright AP and lateral views of the chest. More lordotic positioning today. Stable cardiac size and mediastinal contours. Abnormally increased lower lobe region opacity on the lateral view, not well correlated on the frontal view. No pleural effusion. No pneumothorax or pulmonary edema. Advanced degenerative  changes at both shoulders. No acute osseous abnormality identified.  IMPRESSION: Abnormal lower lobe opacity, favor on the right, compatible with pneumonia. No pleural effusion identified.  Post treatment radiographs recommended to document resolution.   Electronically Signed   By: Genevie Ann M.D.   On: 11/30/2014 20:07    EKG: Independently reviewed.  Assessment/Plan Principal Problem:   HCAP (healthcare-associated pneumonia) Active Problems:   Right lower lobe pneumonia   Sepsis  RLL HCAP with sepsis -  PNA pathway  Cefepime and vanc per pharm  Cultures pending  Tele monitor for tachycardia  Tylenol for fever  Getting IVF bolus in ED for sepsis HTN - hold HCTZ while hydrating patient    Code Status: Full Code  Family Communication: Family at bedside Disposition Plan: Admit to inpatient   Time spent: 10 min  GARDNER, JARED M. [facility] Pager [phone]  If 7AM-7PM, please contact the day team taking care of the patient [email or web address] Password [facility] 11/30/2014, 9:36 PM

## 2014-11-30 NOTE — ED Notes (Signed)
Bed: [facility] Expected date:  Expected time:  Means of arrival:  Comments: EMS- elderly, fever, recent hip replacement

## 2014-11-30 NOTE — Progress Notes (Signed)
ANTIBIOTIC CONSULT NOTE - INITIAL  Pharmacy Consult for Vancomycin Indication: pneumonia, Treat as HCAP  No Known Allergies  Patient Measurements:   TBW: 87 kg  Vital Signs: Temp: 99.6 F (37.6 C) (04/07 2047) Temp Source: Oral (04/07 2047) BP: 126/64 mmHg (04/07 2047) Pulse Rate: 103 (04/07 2047) Intake/Output from previous day:   Intake/Output from this shift: Total I/O In: 1000 [I.V.:1000] Out: -   Labs:  Recent Labs  11/30/14 1913  WBC 20.8*  HGB 8.8*  PLT 464*  CREATININE 0.70   CrCl cannot be calculated (Unknown ideal weight.). No results for input(s): VANCOTROUGH, VANCOPEAK, VANCORANDOM, GENTTROUGH, GENTPEAK, GENTRANDOM, TOBRATROUGH, TOBRAPEAK, TOBRARND, AMIKACINPEAK, AMIKACINTROU, AMIKACIN in the last 72 hours.   Microbiology: Recent Results (from the past 720 hour(s))  Surgical pcr screen     Status: Abnormal   Collection Time: 11/10/14 12:46 PM  Result Value Ref Range Status   MRSA, PCR NEGATIVE NEGATIVE Final   Staphylococcus aureus POSITIVE (A) NEGATIVE Final    Comment:        The Xpert SA Assay (FDA approved for NASAL specimens in patients over 4 years of age), is one component of a comprehensive surveillance program.  Test performance has been validated by [facility] for patients greater than or equal to 65 year old. It is not intended to diagnose infection nor to guide or monitor treatment.    Medical History: Past Medical History  Diagnosis Date  . Asthma   . Hypertension   . Hepatitis C     from blood transfusion  . Arthritis    Medications:  Scheduled:   Anti-infectives    Start     Dose/Rate Route Frequency Ordered Stop   11/30/14 2115  vancomycin (VANCOCIN) 1,500 mg in sodium chloride 0.9 % 500 mL IVPB     1,500 mg 250 mL/hr over 120 Minutes Intravenous  Once 11/30/14 2104     11/30/14 2100  ceFEPIme (MAXIPIME) 1 g in dextrose 5 % 50 mL IVPB     1 g 100 mL/hr over 30 Minutes Intravenous  Once 11/30/14 2055        Assessment: 73 yoF with RLL lobe infiltrate, to ED from home with fever, cough. Recent R THA 3/30. Begin Vancomycin per pharmacy for HCAP  Cefepime 1gm x1 in ED, Vancomycin 1500mg  x1 in ED  Goal of Therapy:  Vancomycin trough level 15-20 mcg/ml  Plan:   Vancomycin 750mg  q12  Cefepime 1gm q8hr  Planning abx x 8 days for HCAP  Minda Ditto PharmD Pager [phone] 11/30/2014, 9:41 PM

## 2014-11-30 NOTE — Progress Notes (Addendum)
CSW met with pt at bedside. Patient appears to be hard of hearing. Sister was present. Patient confirms that she presents to [facility] due to fever. Also, pt informed CSW that she recently has a hip replacement.  Sister informed CSW that pt lives at home with her son and granddaughter. Sister states that she has also been living with pt since March 30 and has been assisting pt with her ADL's. Sister stated that other family members are also supportive of pt. She states that the pt currently has home health through [facility] and that they will begin servicing the pt tomorrow. She states they will start coming to the home x3 a week.  Sister states that pt has not fallen within the past 6 months.  Patient states that she is not interested in going to a facility. Patient states that she does not have any questions. She appears to be determined that she is not interested in going to a facility.  Willette Brace [phone] ED CSW 11/30/2014 10:02 PM

## 2014-11-30 NOTE — ED Notes (Signed)
Brought in by EMS from home with c/o fever. Pt's family reported that pt has been having fever throughout the day--- pt was given Tylenol but fever persists.  Pt has had recent right hip replacement surgery---- surgical incision treated daily by home health nurse.  Pt presents to ED A/Ox4, c/o "soreness" to surgical area, "not really painful"; dressing to site dry and intact.

## 2014-11-30 NOTE — Progress Notes (Signed)
EDCM spoke to patient and her family at bedside.  Patient reports she lives at home with her grand daughter and patient's son.  Patient's family reports patient was at [facility] hospital for hip surgery from 03/30-04/02.  Patient did not go to a SNF, patient went home.  Patient's family report patient receives 24 hour care at home.  Patient's pcp is Dr. Herma Carson bland.  Patient's family reports she believes patient's orthopedic appointment is on the 12th of April.  Patient has not seen a doctor since she has been discharged.  Patient did not receive a discharge follow up phone call per family.  Patient has a walker, cane, wheelchair, bedside commode and shower chair at home.   Patient's family reports patient is receiving home health services with [facility] for PT, OT.  Patient's family reports there was a problem with her insurance and [place] Go, so patient has Haematologist.  Readmission information placed in readmission focus note.  No further EDCM needs at this time.

## 2014-11-30 NOTE — ED Provider Notes (Signed)
CSN: [id number]     Arrival date & time 11/30/14  1843 History   First MD Initiated Contact with Patient 11/30/14 1917     Chief Complaint  Patient presents with  . Fever     (Consider location/radiation/quality/duration/timing/severity/associated sxs/prior Treatment) HPI Peggy Welch is a 74 y.o. female who comes in for evaluation of cough and fever. Patient is accompanied by her sister and granddaughter who contributed history of present illness. Family states patient recently had right hip replaced surgery on March 30. Since that time she has had an increasingly worsening cough that is productive with clear sputum. Therefore she has been relatively sedentary around the house except for getting up to use the bathroom. Patient reports feeling hot today, check temperature and it was 102.0. Patient took 2 650 mg Tylenol for fever and then at that point EMS was called for transport to ED. Patient denies any chest pain or shortness of breath, numbness or weakness  Past Medical History  Diagnosis Date  . Asthma   . Hypertension   . Hepatitis C     from blood transfusion  . Arthritis    Past Surgical History  Procedure Laterality Date  . Joint replacement      Bilateral total knee replacements  . Cholecystectomy  02/21/2002  . Right rotator cuff repair  08/12/2006  . 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 Total hip arthroplasty Right 11/22/2014    Procedure: TOTAL HIP ARTHROPLASTY ANTERIOR APPROACH;  Surgeon: Marybelle Killings, MD;  Location: [facility];  Service: Orthopedics;  Laterality: Right;   History reviewed. No pertinent family history. History  Substance Use Topics  . Smoking status: Never Smoker   . Smokeless tobacco: Never Used  . Alcohol Use: No   OB History    No data available     Review of Systems    Allergies  Review of patient's allergies indicates no known allergies.  Home Medications   Prior to Admission medications   Medication Sig Start Date  End Date Taking? Authorizing Provider  acetaminophen (TYLENOL) 650 MG CR tablet Take 650 mg by mouth every 8 (eight) hours as needed for pain (pain).   Yes Historical Provider, MD  albuterol (PROVENTIL) (2.5 MG/3ML) 0.083% nebulizer solution Take 2.5 mg by nebulization every 4 (four) hours as needed for wheezing or shortness of breath (wheezing).   Yes Historical Provider, MD  aspirin EC 325 MG tablet Take 1 tablet (325 mg total) by mouth 2 (two) times daily. 11/24/14  Yes Naiping Ephriam Jenkins, MD  B Complex-C (B-COMPLEX WITH VITAMIN C) tablet Take 1 tablet by mouth daily.   Yes Historical Provider, MD  hydrochlorothiazide (HYDRODIURIL) 25 MG tablet Take 25 mg by mouth daily.   Yes Historical Provider, MD  methocarbamol (ROBAXIN) 500 MG tablet Take 1 tablet (500 mg total) by mouth every 6 (six) hours as needed for muscle spasms. 11/24/14  Yes Lanae Crumbly, PA-C  oxyCODONE-acetaminophen (ROXICET) 5-325 MG per tablet Take 1 tablet by mouth every 6 (six) hours as needed for severe pain. 11/24/14  Yes Lanae Crumbly, PA-C  potassium chloride SA (K-DUR,KLOR-CON) 20 MEQ tablet Take 20 mEq by mouth daily. 10/25/14  Yes Historical Provider, MD  alendronate (FOSAMAX) 70 MG tablet Take 70 mg by mouth every 7 (seven) days. On Tuesdays. Take with a full glass of water on an empty stomach.    Historical Provider, MD  polyethylene glycol (MIRALAX / GLYCOLAX) packet Take 17 g  by mouth daily. 11/24/14   Lanae Crumbly, PA-C   BP 132/74 mmHg  Pulse 110  Temp(Src) 100.9 F (38.3 C) (Oral)  Resp 18  SpO2 96% Physical Exam  Constitutional: She is oriented to person, place, and time. She appears well-developed and well-nourished. No distress.  HENT:  Head: Normocephalic and atraumatic.  Mouth/Throat: Oropharynx is clear and moist.  Eyes: Conjunctivae are normal. Pupils are equal, round, and reactive to light. Right eye exhibits no discharge. Left eye exhibits no discharge. No scleral icterus.  Neck: Normal range of motion. Neck  supple.  Cardiovascular: Normal rate, regular rhythm and normal heart sounds.   Pulmonary/Chest: Effort normal and breath sounds normal. No respiratory distress.  Diffuse mild wheezing with rales in right lower lobe. Mild rales in left mid lobe.  Abdominal: Soft. There is no tenderness.  Musculoskeletal: She exhibits no tenderness.  Neurological: She is alert and oriented to person, place, and time.  Cranial Nerves II-XII grossly intact  Skin: Skin is warm and dry. No rash noted. She is not diaphoretic.  Right hip surgical scar appears to be healing well with no erythema, warmth or overt evidence of infection  Psychiatric: She has a normal mood and affect.  Nursing note and vitals reviewed.   ED Course  Procedures (including critical care time) Labs Review Labs Reviewed  CULTURE, BLOOD (ROUTINE X 2)  CULTURE, BLOOD (ROUTINE X 2)  URINE CULTURE  CBC WITH DIFFERENTIAL/PLATELET  COMPREHENSIVE METABOLIC PANEL  URINALYSIS, ROUTINE W REFLEX MICROSCOPIC  I-STAT CG4 LACTIC ACID, ED    Imaging Review No results found.   EKG Interpretation None     Meds given in ED:  Medications  acetaminophen (TYLENOL) tablet 650 mg (650 mg Oral Not Given 11/30/14 1939)  ceFEPIme (MAXIPIME) 1 g in dextrose 5 % 50 mL IVPB (not administered)  vancomycin (VANCOCIN) 1,500 mg in sodium chloride 0.9 % 500 mL IVPB (not administered)  acetaminophen (TYLENOL) tablet 650 mg (not administered)  aspirin EC tablet 325 mg (not administered)  oxyCODONE-acetaminophen (PERCOCET/ROXICET) 5-325 MG per tablet 1 tablet (not administered)  methocarbamol (ROBAXIN) tablet 500 mg (not administered)  polyethylene glycol (MIRALAX / GLYCOLAX) packet 17 g (not administered)  albuterol (PROVENTIL) (2.5 MG/3ML) 0.083% nebulizer solution 2.5 mg (not administered)  sodium chloride 0.9 % bolus 1,000 mL (0 mLs Intravenous Stopped 11/30/14 2045)  sodium chloride 0.9 % bolus 30 mL/kg (2,000 mLs Intravenous New Bag/Given 11/30/14 2117)     New Prescriptions   No medications on file   Filed Vitals:   11/30/14 1853 11/30/14 2047 11/30/14 2114  BP: 132/74 126/64   Pulse: 110 103   Temp: 100.9 F (38.3 C) 99.6 F (37.6 C)   TempSrc: Oral Oral   Resp: 18 18   Height:   5\' 4"  (1.626 m)  Weight:   190 lb (86.183 kg)  SpO2: 96% 92%     MDM  Vitals stable - WNL -afebrile Pt resting comfortably in ED. PE--diffuse wheezing throughout lung exam on auscultation. Bilateral lower rales. Maintaining oxygen saturations on room air. Surgical wound on the right hip appears to be healing well. No erythema, warmth or evidence of infection. No evidence of CHF, PE Labwork--leukocytosis 20.8  Imaging-chest x-ray shows opacity compatible with lower lobe pneumonia.  Patient treated empirically for healthcare associated pneumonia. Initiated vancomycin cefepime. Given 1 L normal saline bolus. Started 30 ML/KG normal saline.  Prior to patient discharge, I discussed and reviewed this case with Dr.Yao, who also saw and evaluated pt  Final diagnoses:  Fever        Comer Locket, PA-C 12/01/14 1603  Wandra Arthurs, MD 12/03/14 [phone]

## 2014-12-01 ENCOUNTER — Encounter (HOSPITAL_COMMUNITY): Payer: Self-pay | Admitting: Internal Medicine

## 2014-12-01 DIAGNOSIS — R05 Cough: Secondary | ICD-10-CM

## 2014-12-01 DIAGNOSIS — R059 Cough, unspecified: Secondary | ICD-10-CM | POA: Diagnosis present

## 2014-12-01 DIAGNOSIS — D62 Acute posthemorrhagic anemia: Secondary | ICD-10-CM | POA: Diagnosis present

## 2014-12-01 DIAGNOSIS — Z96641 Presence of right artificial hip joint: Secondary | ICD-10-CM

## 2014-12-01 DIAGNOSIS — E876 Hypokalemia: Secondary | ICD-10-CM

## 2014-12-01 LAB — EXPECTORATED SPUTUM ASSESSMENT W GRAM STAIN, RFLX TO RESP C

## 2014-12-01 LAB — URINALYSIS, ROUTINE W REFLEX MICROSCOPIC
BILIRUBIN URINE: NEGATIVE
Glucose, UA: NEGATIVE mg/dL
Hgb urine dipstick: NEGATIVE
Ketones, ur: NEGATIVE mg/dL
NITRITE: NEGATIVE
PROTEIN: NEGATIVE mg/dL
SPECIFIC GRAVITY, URINE: 1.01 (ref 1.005–1.030)
UROBILINOGEN UA: 1 mg/dL (ref 0.0–1.0)
pH: 5.5 (ref 5.0–8.0)

## 2014-12-01 LAB — URINE MICROSCOPIC-ADD ON

## 2014-12-01 LAB — HIV ANTIBODY (ROUTINE TESTING W REFLEX): HIV Screen 4th Generation wRfx: NONREACTIVE

## 2014-12-01 LAB — EXPECTORATED SPUTUM ASSESSMENT W REFEX TO RESP CULTURE

## 2014-12-01 LAB — STREP PNEUMONIAE URINARY ANTIGEN: STREP PNEUMO URINARY ANTIGEN: NEGATIVE

## 2014-12-01 MED ORDER — FERROUS SULFATE 325 (65 FE) MG PO TABS
325.0000 mg | ORAL_TABLET | Freq: Every day | ORAL | Status: DC
  Administered 2014-12-01 – 2014-12-06 (×6): 325 mg via ORAL
  Filled 2014-12-01 (×6): qty 1

## 2014-12-01 MED ORDER — GUAIFENESIN-DM 100-10 MG/5ML PO SYRP
5.0000 mL | ORAL_SOLUTION | Freq: Four times a day (QID) | ORAL | Status: DC
  Administered 2014-12-01 – 2014-12-06 (×19): 5 mL via ORAL
  Filled 2014-12-01 (×19): qty 10

## 2014-12-01 MED ORDER — POTASSIUM CHLORIDE CRYS ER 20 MEQ PO TBCR
20.0000 meq | EXTENDED_RELEASE_TABLET | Freq: Three times a day (TID) | ORAL | Status: AC
  Administered 2014-12-01 (×3): 20 meq via ORAL
  Filled 2014-12-01 (×3): qty 1

## 2014-12-01 MED ORDER — ASPIRIN EC 325 MG PO TBEC
325.0000 mg | DELAYED_RELEASE_TABLET | Freq: Every day | ORAL | Status: DC
  Administered 2014-12-02 – 2014-12-06 (×5): 325 mg via ORAL
  Filled 2014-12-01 (×5): qty 1

## 2014-12-01 MED ORDER — HYDROCOD POLST-CHLORPHEN POLST 10-8 MG/5ML PO LQCR
5.0000 mL | Freq: Once | ORAL | Status: AC
  Administered 2014-12-01: 5 mL via ORAL
  Filled 2014-12-01: qty 5

## 2014-12-01 NOTE — Progress Notes (Signed)
Taking over care of patient. Agree with previous RN assessment, patient denies any needs at this time. Will continue to monitor.

## 2014-12-01 NOTE — Progress Notes (Signed)
Progress Note   Peggy Welch:[id number] DOB: September 10, 1940 DOA: 11/30/2014 PCP: Elyn Peers, MD   Brief Narrative:   Peggy Welch is an 74 y.o. female with a PMH of asthma, hypertension, and hepatitis C who was admitted 11/30/14 with chief complaint of fever and cough. The patient had a recent [facility] 11/23/14. Chest x-ray, done on admission, showed a right lower lobe pneumonia.  Assessment/Plan:   Principal Problem:   Sepsis secondary to right lower lobe healthcare associated pneumonia - Continue empiric cefepime and vancomycin. - Hemodynamically stable post IV fluid boluses. - Follow-up blood cultures, sputum culture, strep pneumonia and Legionella antigens.  Active Problems:   Acute blood loss anemia - Add iron supplement.    Cough - Will add Robitussin DM for cough.    Hypokalemia - Replete.    HTN (hypertension) - Hydrochlorothiazide currently on hold.    History of total hip replacement - PT evaluation.    DVT Prophylaxis  Code Status: Full. Family Communication: Daughter updated at bedside. Disposition Plan: Home when stable.   IV Access:    Peripheral IV   Procedures and diagnostic studies:   Dg Chest 2 View  11/30/2014   CLINICAL DATA:  74 year old female with fever and cough for 4 days. Initial encounter. Chronic hepatitis-C.  EXAM: CHEST  2 VIEW  COMPARISON:  11/10/2014 and earlier.  FINDINGS: Semi upright AP and lateral views of the chest. More lordotic positioning today. Stable cardiac size and mediastinal contours. Abnormally increased lower lobe region opacity on the lateral view, not well correlated on the frontal view. No pleural effusion. No pneumothorax or pulmonary edema. Advanced degenerative changes at both shoulders. No acute osseous abnormality identified.  IMPRESSION: Abnormal lower lobe opacity, favor on the right, compatible with pneumonia. No pleural effusion identified.  Post treatment radiographs recommended to document  resolution.   Electronically Signed   By: Genevie Ann M.D.   On: 11/30/2014 20:07     Medical Consultants:    None.  Anti-Infectives:    Cefepime 11/30/14--->  Vancomycin 11/30/14--->  Subjective:   [name] tells me her appetite is fair.  Was eating good before coming into the hospital.  Still having some fevers.  No dyspnea.  Has a cough with white sputum.  Objective:    Filed Vitals:   11/30/14 2114 11/30/14 2141 11/30/14 2222 12/01/14 0529  BP:  117/61 121/58 124/65  Pulse:  105 106 95  Temp:  100.5 F (38.1 C) 99.2 F (37.3 C) 98.4 F (36.9 C)  TempSrc:  Oral Oral Oral  Resp:  20 20 18   Height: 5\' 4"  (1.626 m)  5\' 4"  (1.626 m)   Weight: 86.183 kg (190 lb)  89.8 kg (197 lb 15.6 oz)   SpO2:  97% 94% 97%    Intake/Output Summary (Last 24 hours) at 12/01/14 0810 Last data filed at 12/01/14 0700  Gross per 24 hour  Intake   3040 ml  Output      0 ml  Net   3040 ml    Exam: Gen:  NAD Cardiovascular:  RRR, No M/R/G Respiratory:  Lungs course Gastrointestinal:  Abdomen soft, NT/ND, + BS Extremities:  No C/E/C, right thigh incision line without signs of infection   Data Reviewed:    Labs: Basic Metabolic Panel:  Recent Labs Lab 11/30/14 1913  NA 134*  K 3.1*  CL 96  CO2 29  GLUCOSE 116*  BUN 14  CREATININE 0.70  CALCIUM 8.2*  GFR Estimated Creatinine Clearance: 65.9 mL/min (by C-G formula based on Cr of 0.7). Liver Function Tests:  Recent Labs Lab 11/30/14 1913  AST 29  ALT 20  ALKPHOS 54  BILITOT 0.9  PROT 7.0  ALBUMIN 3.0*   CBC:  Recent Labs Lab 11/25/14 0501 11/30/14 1913  WBC 14.2* 20.8*  NEUTROABS  --  15.1*  HGB 9.1* 8.8*  HCT 27.5* 28.0*  MCV 93.5 94.6  PLT 248 464*   Sepsis Labs:  Recent Labs Lab 11/25/14 0501 11/30/14 1913 11/30/14 1920  WBC 14.2* 20.8*  --   LATICACIDVEN  --   --  1.04   Microbiology No results found for this or any previous visit (from the past 240 hour(s)).   Medications:   .  aspirin EC  325 mg Oral BID  . ceFEPime (MAXIPIME) IV  1 g Intravenous 3 times per day  . heparin  5,000 Units Subcutaneous 3 times per day  . polyethylene glycol  17 g Oral Daily  . vancomycin  750 mg Intravenous Q12H   Continuous Infusions:   Time spent: 35 minutes with > 50% of time discussing current diagnostic test results, clinical impression and plan of care.    LOS: 1 day   [name]  [facility] Pager [phone]. If unable to reach me by pager, please call my cell phone at [phone].  *Please refer to [email or web address], password [email or web address] to get updated schedule on who will round on this patient, as hospitalists switch teams weekly. If 7PM-7AM, please contact night-coverage at [email or web address], password [email or web address] for any overnight needs.  12/01/2014, 8:10 AM

## 2014-12-01 NOTE — Progress Notes (Signed)
Patient with elevated temp 102.9 tylenol given and Dr. Rockne Menghini notified, no new order given will continue to assess patient.

## 2014-12-01 NOTE — Progress Notes (Signed)
Spoke with pt's granddaughter at bedside concerning [place].  Pt was active with [facility] and will continue with them.  MD, Will need Golf orders please . Thanks

## 2014-12-02 LAB — BASIC METABOLIC PANEL
Anion gap: 7 (ref 5–15)
BUN: 8 mg/dL (ref 6–23)
CO2: 27 mmol/L (ref 19–32)
Calcium: 7.6 mg/dL — ABNORMAL LOW (ref 8.4–10.5)
Chloride: 95 mmol/L — ABNORMAL LOW (ref 96–112)
Creatinine, Ser: 0.57 mg/dL (ref 0.50–1.10)
GFR calc Af Amer: >90 mL/min (ref >90)
GFR calc non Af Amer: 88 mL/min — ABNORMAL LOW (ref >90)
GLUCOSE: 119 mg/dL — AB (ref 70–99)
Potassium: 3.1 mmol/L — ABNORMAL LOW (ref 3.5–5.1)
Sodium: 129 mmol/L — ABNORMAL LOW (ref 135–145)

## 2014-12-02 LAB — MAGNESIUM: MAGNESIUM: 1.9 mg/dL (ref 1.5–2.5)

## 2014-12-02 LAB — URINE CULTURE
Colony Count: NO GROWTH
Culture: NO GROWTH

## 2014-12-02 MED ORDER — POTASSIUM CHLORIDE CRYS ER 20 MEQ PO TBCR
20.0000 meq | EXTENDED_RELEASE_TABLET | Freq: Four times a day (QID) | ORAL | Status: AC
  Administered 2014-12-02 (×4): 20 meq via ORAL
  Filled 2014-12-02 (×4): qty 1

## 2014-12-02 MED ORDER — GUAIFENESIN ER 600 MG PO TB12
600.0000 mg | ORAL_TABLET | Freq: Two times a day (BID) | ORAL | Status: DC
  Administered 2014-12-02 – 2014-12-06 (×9): 600 mg via ORAL
  Filled 2014-12-02 (×9): qty 1

## 2014-12-02 MED ORDER — POLYETHYLENE GLYCOL 3350 17 G PO PACK
17.0000 g | PACK | ORAL | Status: DC
  Administered 2014-12-03 – 2014-12-05 (×2): 17 g via ORAL
  Filled 2014-12-02 (×2): qty 1

## 2014-12-02 NOTE — Progress Notes (Signed)
Progress Note   Peggy Welch PFX:[id number] DOB: December 18, 1940 DOA: 11/30/2014 PCP: Elyn Peers, MD   Brief Narrative:   Peggy Welch is an 74 y.o. female with a PMH of asthma, hypertension, and hepatitis C who was admitted 11/30/14 with chief complaint of fever and cough. The patient had a recent [facility] 11/23/14. Chest x-ray, done on admission, showed a right lower lobe pneumonia.  Assessment/Plan:   Principal Problem:   Sepsis secondary to right lower lobe healthcare associated pneumonia - Continue empiric cefepime and vancomycin. - Remains hemodynamically stable post IV fluid boluses given on admission. - Follow-up blood cultures, sputum culture, and Legionella antigen. Strep pneumonia antigen negative. - Still spiking fevers. Continue Tylenol as needed.  Active Problems:   Acute blood loss anemia - Continue iron supplement.    Cough - Continue Robitussin DM for cough.    Hypokalemia - Increase supplement dose.    HTN (hypertension) - Hydrochlorothiazide currently on hold.    History of total hip replacement - PT evaluation. - Notified Dr. Lorin Mercy via EPIC of patient's admission.    DVT Prophylaxis - Continue heparin. When discharged, resume twice a day aspirin as recommended by orthopedics.  Code Status: Full. Family Communication: Daughter updated at bedside. Disposition Plan: Home when stable.   IV Access:    Peripheral IV   Procedures and diagnostic studies:   Dg Chest 2 View 11/30/2014: Abnormal lower lobe opacity, favor on the right, compatible with pneumonia. No pleural effusion identified.  Post treatment radiographs recommended to document resolution.     Medical Consultants:    None.  Anti-Infectives:    Cefepime 11/30/14--->  Vancomycin 11/30/14--->  Subjective:   Peggy Welch tells me she moved her bowels yesterday, appetite improving.  Peggy Welch having some fevers.  No dyspnea.  Has a cough with white sputum, but  improving.  Objective:    Filed Vitals:   12/01/14 1446 12/01/14 1550 12/01/14 2231 12/02/14 0551  BP: 151/63  110/79 125/63  Pulse: 102  104 106  Temp: 102.9 F (39.4 C) 100.1 F (37.8 C) 98.9 F (37.2 C) 100.5 F (38.1 C)  TempSrc: Oral Oral Oral Oral  Resp: 20  20 20   Height:      Weight:      SpO2: 92%  95% 96%    Intake/Output Summary (Last 24 hours) at 12/02/14 0814 Last data filed at 12/01/14 1700  Gross per 24 hour  Intake    600 ml  Output    130 ml  Net    470 ml    Exam: Gen:  NAD Cardiovascular:  RRR, II/VI SEM LUSB Respiratory:  Lungs course Gastrointestinal:  Abdomen soft, NT/ND, + BS Extremities:  No C/E/C, good pulses   Data Reviewed:    Labs: Basic Metabolic Panel:  Recent Labs Lab 11/30/14 1913 12/02/14 0500  NA 134* 129*  K 3.1* 3.1*  CL 96 95*  CO2 29 27  GLUCOSE 116* 119*  BUN 14 8  CREATININE 0.70 0.57  CALCIUM 8.2* 7.6*   GFR Estimated Creatinine Clearance: 65.9 mL/min (by C-G formula based on Cr of 0.57). Liver Function Tests:  Recent Labs Lab 11/30/14 1913  AST 29  ALT 20  ALKPHOS 54  BILITOT 0.9  PROT 7.0  ALBUMIN 3.0*   CBC:  Recent Labs Lab 11/30/14 1913  WBC 20.8*  NEUTROABS 15.1*  HGB 8.8*  HCT 28.0*  MCV 94.6  PLT 464*   Sepsis Labs:  Recent Labs Lab 11/30/14  1913 11/30/14 1920  WBC 20.8*  --   LATICACIDVEN  --  1.04   Microbiology Recent Results (from the past 240 hour(s))  Culture, blood (routine x 2)     Status: None (Preliminary result)   Collection Time: 11/30/14  7:10 PM  Result Value Ref Range Status   Specimen Description   Final    RIGHT ANTECUBITAL Performed at [place] Requests   Final    BOTTLES DRAWN [place] Performed at [facility]    Culture   Final           BLOOD CULTURE RECEIVED NO GROWTH TO DATE CULTURE WILL BE HELD FOR 5 DAYS BEFORE ISSUING A FINAL NEGATIVE REPORT Performed at [facility]    Report  Status PENDING  Incomplete  Culture, blood (routine x 2)     Status: None (Preliminary result)   Collection Time: 11/30/14  7:25 PM  Result Value Ref Range Status   Specimen Description   Final    RIGHT ANTECUBITAL Performed at [place] Requests   Final    BOTTLES DRAWN AEROBIC AND ANAEROBIC 5CC Performed at [facility]    Culture   Final           BLOOD CULTURE RECEIVED NO GROWTH TO DATE CULTURE WILL BE HELD FOR 5 DAYS BEFORE ISSUING A FINAL NEGATIVE REPORT Performed at [facility]    Report Status PENDING  Incomplete  Culture, sputum-assessment     Status: None   Collection Time: 12/01/14  1:45 PM  Result Value Ref Range Status   Specimen Description SPUTUM  Final   Special Requests NONE  Final   Sputum evaluation   Final    THIS SPECIMEN IS ACCEPTABLE. RESPIRATORY CULTURE REPORT TO FOLLOW.   Report Status 12/01/2014 FINAL  Final     Medications:   . aspirin EC  325 mg Oral Daily  . ceFEPime (MAXIPIME) IV  1 g Intravenous 3 times per day  . ferrous sulfate  325 mg Oral Q breakfast  . guaiFENesin-dextromethorphan  5 mL Oral QID  . heparin  5,000 Units Subcutaneous 3 times per day  . polyethylene glycol  17 g Oral Daily  . vancomycin  750 mg Intravenous Q12H   Continuous Infusions:   Time spent: 25 minutes.    LOS: 2 days   PeggyWelch  [facility] Pager [phone]. If unable to reach me by pager, please call my cell phone at [phone].  *Please refer to [email or web address], password [email or web address] to get updated schedule on who will round on this patient, as hospitalists switch teams weekly. If 7PM-7AM, please contact night-coverage at [email or web address], password [email or web address] for any overnight needs.  12/02/2014, 8:14 AM

## 2014-12-02 NOTE — Evaluation (Signed)
Physical Therapy Evaluation Patient Details Name: Peggy Welch MRN: [id number] DOB: May 26, 1941 Today's Date: 12/02/2014   History of Present Illness  Pt is a 74 y.o. female s/p Rt THA  Clinical Impression  Patient  Tolerated well, increased coughing with mobility. Patient will benefit from PT to address problems listed in note below.    Follow Up Recommendations Home health PT;Supervision/Assistance - 24 hour    Equipment Recommendations  None recommended by PT    Recommendations for Other Services       Precautions / Restrictions Precautions Precautions: Fall Restrictions Weight Bearing Restrictions: Yes RLE Weight Bearing: Partial weight bearing RLE Partial Weight Bearing Percentage or Pounds: 50      Mobility  Bed Mobility Overal bed mobility: Needs Assistance Bed Mobility: Supine to Sit     Supine to sit: Min assist Sit to supine: Min assist   General bed mobility comments: assist  R leg onto  bed.   Transfers Overall transfer level: Needs assistance Equipment used: Rolling walker (2 wheeled) Transfers: Sit to/from Stand Sit to Stand: Min assist         General transfer comment: increased time and cues on hand placement   Ambulation/Gait Ambulation/Gait assistance: Min assist           General Gait Details: side steps along bed x 4 steps.  Stairs            Wheelchair Mobility    Modified Rankin (Stroke Patients Only)       Balance                                             Pertinent Vitals/Pain Pain Score: 1  Pain Location: R hip Pain Descriptors / Indicators: Discomfort Pain Intervention(s): Monitored during session    Home Living Family/patient expects to be discharged to:: Private residence Living Arrangements: Children Available Help at Discharge: Family;Available 24 hours/day Type of Home: House Home Access: Stairs to enter Entrance Stairs-Rails: Right Entrance Stairs-Number of Steps: 4 Home  Layout: One level Home Equipment: Cane - single point;Bedside commode      Prior Function Level of Independence: Needs assistance   Gait / Transfers Assistance Needed: amb w/ RW since surgery           Hand Dominance        Extremity/Trunk Assessment               Lower Extremity Assessment: RLE deficits/detail RLE Deficits / Details: able  to stand  and bear weight to take side steps.       Communication      Cognition Arousal/Alertness: Awake/alert Behavior During Therapy: WFL for tasks assessed/performed Overall Cognitive Status: Within Functional Limits for tasks assessed                      General Comments      Exercises Total Joint Exercises Heel Slides: AAROM;Right;10 reps;Supine      Assessment/Plan    PT Assessment Patient needs continued PT services  PT Diagnosis Difficulty walking   PT Problem List Decreased strength;Decreased range of motion;Decreased activity tolerance;Decreased mobility  PT Treatment Interventions DME instruction;Gait training;Stair training;Functional mobility training;Therapeutic activities;Therapeutic exercise;Balance training;Neuromuscular re-education;Patient/family education   PT Goals (Current goals can be found in the Care Plan section) Acute Rehab PT Goals Patient Stated Goal: to go home PT Goal Formulation: With  patient Time For Goal Achievement: 12/16/14 Potential to Achieve Goals: Good    Frequency Min 5X/week   Barriers to discharge        Co-evaluation               End of Session   Activity Tolerance: Patient tolerated treatment well Patient left: in bed;with call bell/phone within reach;with bed alarm set Nurse Communication: Mobility status         Time: 3382-5053 PT Time Calculation (min) (ACUTE ONLY): 22 min   Charges:   PT Evaluation $Initial PT Evaluation Tier I: 1 Procedure     PT G CodesClaretha Cooper 12/02/2014, 5:28 PM Tresa Endo  PT [phone]

## 2014-12-03 LAB — BASIC METABOLIC PANEL
Anion gap: 8 (ref 5–15)
BUN: 7 mg/dL (ref 6–23)
CALCIUM: 7.8 mg/dL — AB (ref 8.4–10.5)
CO2: 27 mmol/L (ref 19–32)
Chloride: 102 mmol/L (ref 96–112)
Creatinine, Ser: 0.52 mg/dL (ref 0.50–1.10)
GFR calc Af Amer: >90 mL/min (ref >90)
Glucose, Bld: 105 mg/dL — ABNORMAL HIGH (ref 70–99)
POTASSIUM: 3.7 mmol/L (ref 3.5–5.1)
Sodium: 137 mmol/L (ref 135–145)

## 2014-12-03 LAB — CBC
HCT: 25.6 % — ABNORMAL LOW (ref 36.0–46.0)
HEMOGLOBIN: 8.1 g/dL — AB (ref 12.0–15.0)
MCH: 30.5 pg (ref 26.0–34.0)
MCHC: 31.6 g/dL (ref 30.0–36.0)
MCV: 96.2 fL (ref 78.0–100.0)
Platelets: 551 10*3/uL — ABNORMAL HIGH (ref 150–400)
RBC: 2.66 MIL/uL — ABNORMAL LOW (ref 3.87–5.11)
RDW: 14.4 % (ref 11.5–15.5)
WBC: 19.1 10*3/uL — AB (ref 4.0–10.5)

## 2014-12-03 LAB — VANCOMYCIN, TROUGH: Vancomycin Tr: 9.8 ug/mL — ABNORMAL LOW (ref 10.0–20.0)

## 2014-12-03 LAB — CULTURE, RESPIRATORY: CULTURE: NORMAL

## 2014-12-03 LAB — CULTURE, RESPIRATORY W GRAM STAIN

## 2014-12-03 MED ORDER — SALINE SPRAY 0.65 % NA SOLN
1.0000 | NASAL | Status: DC | PRN
  Administered 2014-12-03: 1 via NASAL
  Filled 2014-12-03: qty 44

## 2014-12-03 MED ORDER — VANCOMYCIN HCL 10 G IV SOLR
1250.0000 mg | Freq: Two times a day (BID) | INTRAVENOUS | Status: DC
  Administered 2014-12-03 – 2014-12-05 (×6): 1250 mg via INTRAVENOUS
  Filled 2014-12-03 (×7): qty 1250

## 2014-12-03 NOTE — Progress Notes (Signed)
ANTIBIOTIC CONSULT NOTE  Pharmacy Consult for Vancomycin Indication: pneumonia, Treat as HCAP  No Known Allergies  Patient Measurements: Height: 5\' 4"  (162.6 cm) Weight: 197 lb 15.6 oz (89.8 kg) IBW/kg (Calculated) : 54.7 TBW: 87 kg  Vital Signs: Temp: 100.4 F (38 C) (04/10 0554) Temp Source: Oral (04/10 0554) BP: 115/57 mmHg (04/10 0554) Pulse Rate: 93 (04/10 0554) Intake/Output from previous day: 04/09 0701 - 04/10 0700 In: 2520 [P.O.:1920; I.V.:50; IV Piggyback:550] Out: 1000 [Urine:1000] Intake/Output from this shift:    Labs:  Recent Labs  11/30/14 1913 12/02/14 0500 12/03/14 0507  WBC 20.8*  --  19.1*  HGB 8.8*  --  8.1*  PLT 464*  --  551*  CREATININE 0.70 0.57 0.52   Estimated Creatinine Clearance: 65.9 mL/min (by C-G formula based on Cr of 0.52). No results for input(s): VANCOTROUGH, VANCOPEAK, VANCORANDOM, GENTTROUGH, GENTPEAK, GENTRANDOM, TOBRATROUGH, TOBRAPEAK, TOBRARND, AMIKACINPEAK, AMIKACINTROU, AMIKACIN in the last 72 hours.   Microbiology: Recent Results (from the past 720 hour(s))  Surgical pcr screen     Status: Abnormal   Collection Time: 11/10/14 12:46 PM  Result Value Ref Range Status   MRSA, PCR NEGATIVE NEGATIVE Final   Staphylococcus aureus POSITIVE (A) NEGATIVE Final    Comment:        The Xpert SA Assay (FDA approved for NASAL specimens in patients over 66 years of age), is one component of a comprehensive surveillance program.  Test performance has been validated by [facility] for patients greater than or equal to 76 year old. It is not intended to diagnose infection nor to guide or monitor treatment.   Culture, blood (routine x 2)     Status: None (Preliminary result)   Collection Time: 11/30/14  7:10 PM  Result Value Ref Range Status   Specimen Description   Final    RIGHT ANTECUBITAL Performed at [place] Requests   Final    BOTTLES DRAWN AEROBIC AND ANAEROBIC 5CC Performed at [facility]    Culture   Final           BLOOD CULTURE RECEIVED NO GROWTH TO DATE CULTURE WILL BE HELD FOR 5 DAYS BEFORE ISSUING A FINAL NEGATIVE REPORT Performed at [facility]    Report Status PENDING  Incomplete  Culture, blood (routine x 2)     Status: None (Preliminary result)   Collection Time: 11/30/14  7:25 PM  Result Value Ref Range Status   Specimen Description   Final    RIGHT ANTECUBITAL Performed at [place] Requests   Final    BOTTLES DRAWN AEROBIC AND ANAEROBIC 5CC Performed at [facility]    Culture   Final           BLOOD CULTURE RECEIVED NO GROWTH TO DATE CULTURE WILL BE HELD FOR 5 DAYS BEFORE ISSUING A FINAL NEGATIVE REPORT Performed at [facility]    Report Status PENDING  Incomplete  Urine culture     Status: None   Collection Time: 12/01/14  9:14 AM  Result Value Ref Range Status   Specimen Description URINE, CLEAN CATCH  Final   Special Requests NONE  Final   Colony Count NO GROWTH Performed at [facility]   Final   Culture NO GROWTH Performed at [facility]   Final   Report Status 12/02/2014 FINAL  Final  Culture, sputum-assessment     Status: None   Collection Time:  12/01/14  1:45 PM  Result Value Ref Range Status   Specimen Description SPUTUM  Final   Special Requests NONE  Final   Sputum evaluation   Final    THIS SPECIMEN IS ACCEPTABLE. RESPIRATORY CULTURE REPORT TO FOLLOW.   Report Status 12/01/2014 FINAL  Final  Culture, respiratory (NON-Expectorated)     Status: None (Preliminary result)   Collection Time: 12/01/14  1:45 PM  Result Value Ref Range Status   Specimen Description SPUTUM  Final   Special Requests NONE  Final   Gram Stain   Final    FEW WBC PRESENT,BOTH PMN AND MONONUCLEAR RARE SQUAMOUS EPITHELIAL CELLS PRESENT FEW GRAM POSITIVE RODS RARE GRAM NEGATIVE RODS Performed at [facility]    Culture   Final    Culture reincubated for better  growth Performed at [facility]    Report Status PENDING  Incomplete   Medical History: Past Medical History  Diagnosis Date  . Asthma   . Hypertension   . Hepatitis C     from blood transfusion  . Arthritis   . Asthma exacerbation 10/14/2011   Medications:  Scheduled:  . aspirin EC  325 mg Oral Daily  . ceFEPime (MAXIPIME) IV  1 g Intravenous 3 times per day  . ferrous sulfate  325 mg Oral Q breakfast  . guaiFENesin  600 mg Oral BID  . guaiFENesin-dextromethorphan  5 mL Oral QID  . heparin  5,000 Units Subcutaneous 3 times per day  . polyethylene glycol  17 g Oral QODAY  . vancomycin  750 mg Intravenous Q12H   Anti-infectives    Start     Dose/Rate Route Frequency Ordered Stop   12/01/14 1200  vancomycin (VANCOCIN) IVPB 750 mg/150 ml premix     750 mg 150 mL/hr over 60 Minutes Intravenous Every 12 hours 11/30/14 2144 12/08/14 2359   12/01/14 0600  ceFEPIme (MAXIPIME) 1 g in dextrose 5 % 50 mL IVPB     1 g 100 mL/hr over 30 Minutes Intravenous 3 times per day 11/30/14 2135 12/09/14 0559   11/30/14 2145  vancomycin (VANCOCIN) IVPB 750 mg/150 ml premix  Status:  Discontinued     750 mg 150 mL/hr over 60 Minutes Intravenous Every 12 hours 11/30/14 2142 11/30/14 2144   11/30/14 2115  vancomycin (VANCOCIN) 1,500 mg in sodium chloride 0.9 % 500 mL IVPB     1,500 mg 250 mL/hr over 120 Minutes Intravenous  Once 11/30/14 2104 12/01/14 0029   11/30/14 2100  ceFEPIme (MAXIPIME) 1 g in dextrose 5 % 50 mL IVPB     1 g 100 mL/hr over 30 Minutes Intravenous  Once 11/30/14 2055 11/30/14 2202     Assessment: 74 yoF with RLL lobe infiltrate, to ED from home with fever, cough. Recent R THA 3/30. Begin Vancomycin per pharmacy for HCAP.  Received Cefepime 1gm x1 in ED, Vancomycin 1500mg  x1 in ED.  4/7 >> Vancomycin >> 4/7 >> Cefepime >>  Temp: continues to spike low-grade fevers; currently 100.4 WBC: elevated, stable around 20 Renal: SCr wnl, CrCl 66 CG w/ SCr 0.8  4/7  blood: NGTD 4/7 strep Ag: neg 4/7 Legionella Ag: pending 4/8 urine: NGF 4/8 sputum: reincubated  Dose changes/levels: 4/10 1100 VT 9.8 on 750 mg q12  Goal of Therapy:   Vancomycin trough level 15-20 mcg/ml   Eradication of infection  Appropriate antibiotic dosing for indication and renal function  Plan:  Day 4 of antibiotics  Increase vancomycin to 1250 mg  IV q12  Cefepime dosing appropriate for renal function  Follow clinical course, renal function, culture results as available  Follow for de-escalation of antibiotics and LOT   Reuel Boom, PharmD Pager: [phone] 12/03/2014, 9:12 AM

## 2014-12-03 NOTE — Progress Notes (Signed)
Progress Note   Peggy Welch BHA:[id number] DOB: Jul 25, 1941 DOA: 11/30/2014 PCP: Elyn Peers, MD   Brief Narrative:   Peggy Welch is an 74 y.o. female with a PMH of asthma, hypertension, and hepatitis C who was admitted 11/30/14 with chief complaint of fever and cough. The patient had a recent [facility] 11/23/14. Chest x-ray, done on admission, showed a right lower lobe pneumonia.  Assessment/Plan:   Principal Problem:   Sepsis secondary to right lower lobe healthcare associated pneumonia - Continue empiric cefepime and vancomycin. - Remains hemodynamically stable post IV fluid boluses given on admission. - Follow-up blood cultures, sputum culture.  HIV, Legionella & Strep pneumonia antigen negative. - Continue Tylenol as needed.  Fever curve down. - WBC still fairly high, may need to image leg to ensure there is no other source of infection given recent THR.  Active Problems:   Acute blood loss anemia - Continue iron supplement.    Cough - Continue Robitussin DM for cough.    Hypokalemia - Resolved with increase in supplement dose.    HTN (hypertension) - Hydrochlorothiazide currently on hold.    History of total hip replacement - PT evaluation. - Notified Dr. Lorin Mercy via EPIC of patient's admission.    DVT Prophylaxis - Continue heparin. When discharged, resume twice a day aspirin as recommended by orthopedics.  Code Status: Full. Family Communication: Daughter updated at bedside. Disposition Plan: Home when stable.   IV Access:    Peripheral IV   Procedures and diagnostic studies:   Dg Chest 2 View 11/30/2014: Abnormal lower lobe opacity, favor on the right, compatible with pneumonia. No pleural effusion identified.  Post treatment radiographs recommended to document resolution.     Medical Consultants:    None.  Anti-Infectives:    Cefepime 11/30/14--->  Vancomycin 11/30/14--->  Subjective:   Peggy Welch reports some epistaxis, coughing  spells, but denies dyspnea.  Has had a bit of a headache.  Appetite okay.  Last BM was this a.m.  No N/V.  Objective:    Filed Vitals:   12/02/14 1145 12/02/14 1417 12/02/14 2227 12/03/14 0554  BP:  129/64 121/67 115/57  Pulse:  104 96 93  Temp: 99.3 F (37.4 C) 100 F (37.8 C) 98.3 F (36.8 C) 100.4 F (38 C)  TempSrc: Oral Oral Oral Oral  Resp:  20 20 20   Height:      Weight:      SpO2:  97% 97% 97%    Intake/Output Summary (Last 24 hours) at 12/03/14 9735 Last data filed at 12/03/14 0600  Gross per 24 hour  Intake   2520 ml  Output   1000 ml  Net   1520 ml    Exam: Gen:  NAD Cardiovascular:  RRR, II/VI SEM LUSB Respiratory:  Lungs less course today Gastrointestinal:  Abdomen soft, NT/ND, + BS Extremities:  No C/E/C, good pulses   Data Reviewed:    Labs: Basic Metabolic Panel:  Recent Labs Lab 11/30/14 1913 12/02/14 0500 12/03/14 0507  NA 134* 129* 137  K 3.1* 3.1* 3.7  CL 96 95* 102  CO2 29 27 27   GLUCOSE 116* 119* 105*  BUN 14 8 7   CREATININE 0.70 0.57 0.52  CALCIUM 8.2* 7.6* 7.8*  MG  --  1.9  --    GFR Estimated Creatinine Clearance: 65.9 mL/min (by C-G formula based on Cr of 0.52). Liver Function Tests:  Recent Labs Lab 11/30/14 1913  AST 29  ALT 20  ALKPHOS 54  BILITOT 0.9  PROT 7.0  ALBUMIN 3.0*   CBC:  Recent Labs Lab 11/30/14 1913 12/03/14 0507  WBC 20.8* 19.1*  NEUTROABS 15.1*  --   HGB 8.8* 8.1*  HCT 28.0* 25.6*  MCV 94.6 96.2  PLT 464* 551*   Sepsis Labs:  Recent Labs Lab 11/30/14 1913 11/30/14 1920 12/03/14 0507  WBC 20.8*  --  19.1*  LATICACIDVEN  --  1.04  --    Microbiology Recent Results (from the past 240 hour(s))  Culture, blood (routine x 2)     Status: None (Preliminary result)   Collection Time: 11/30/14  7:10 PM  Result Value Ref Range Status   Specimen Description   Final    RIGHT ANTECUBITAL Performed at [place] Requests   Final    BOTTLES DRAWN [place] Performed at [facility]    Culture   Final           BLOOD CULTURE RECEIVED NO GROWTH TO DATE CULTURE WILL BE HELD FOR 5 DAYS BEFORE ISSUING A FINAL NEGATIVE REPORT Performed at [facility]    Report Status PENDING  Incomplete  Culture, blood (routine x 2)     Status: None (Preliminary result)   Collection Time: 11/30/14  7:25 PM  Result Value Ref Range Status   Specimen Description   Final    RIGHT ANTECUBITAL Performed at [place] Requests   Final    BOTTLES DRAWN AEROBIC AND ANAEROBIC 5CC Performed at [facility]    Culture   Final           BLOOD CULTURE RECEIVED NO GROWTH TO DATE CULTURE WILL BE HELD FOR 5 DAYS BEFORE ISSUING A FINAL NEGATIVE REPORT Performed at [facility]    Report Status PENDING  Incomplete  Urine culture     Status: None   Collection Time: 12/01/14  9:14 AM  Result Value Ref Range Status   Specimen Description URINE, CLEAN CATCH  Final   Special Requests NONE  Final   Colony Count NO GROWTH Performed at [facility]   Final   Culture NO GROWTH Performed at [facility]   Final   Report Status 12/02/2014 FINAL  Final  Culture, sputum-assessment     Status: None   Collection Time: 12/01/14  1:45 PM  Result Value Ref Range Status   Specimen Description SPUTUM  Final   Special Requests NONE  Final   Sputum evaluation   Final    THIS SPECIMEN IS ACCEPTABLE. RESPIRATORY CULTURE REPORT TO FOLLOW.   Report Status 12/01/2014 FINAL  Final  Culture, respiratory (NON-Expectorated)     Status: None (Preliminary result)   Collection Time: 12/01/14  1:45 PM  Result Value Ref Range Status   Specimen Description SPUTUM  Final   Special Requests NONE  Final   Gram Stain   Final    FEW WBC PRESENT,BOTH PMN AND MONONUCLEAR RARE SQUAMOUS EPITHELIAL CELLS PRESENT FEW GRAM POSITIVE RODS RARE GRAM NEGATIVE RODS Performed at [facility]    Culture   Final     Culture reincubated for better growth Performed at [facility]    Report Status PENDING  Incomplete     Medications:   . aspirin EC  325 mg Oral Daily  . ceFEPime (MAXIPIME) IV  1 g Intravenous 3 times per day  . ferrous sulfate  325 mg Oral  Q breakfast  . guaiFENesin  600 mg Oral BID  . guaiFENesin-dextromethorphan  5 mL Oral QID  . heparin  5,000 Units Subcutaneous 3 times per day  . polyethylene glycol  17 g Oral QODAY  . vancomycin  750 mg Intravenous Q12H   Continuous Infusions:   Time spent: 25 minutes.    LOS: 3 days   [place] [facility] Pager [phone]. If unable to reach me by pager, please call my cell phone at [phone].  *Please refer to [email or web address], password [email or web address] to get updated schedule on who will round on this patient, as hospitalists switch teams weekly. If 7PM-7AM, please contact night-coverage at [email or web address], password [email or web address] for any overnight needs.  12/03/2014, 9:22 AM

## 2014-12-04 DIAGNOSIS — D509 Iron deficiency anemia, unspecified: Secondary | ICD-10-CM

## 2014-12-04 LAB — BASIC METABOLIC PANEL
ANION GAP: 7 (ref 5–15)
BUN: 7 mg/dL (ref 6–23)
CHLORIDE: 104 mmol/L (ref 96–112)
CO2: 27 mmol/L (ref 19–32)
Calcium: 7.7 mg/dL — ABNORMAL LOW (ref 8.4–10.5)
Creatinine, Ser: 0.55 mg/dL (ref 0.50–1.10)
GFR calc non Af Amer: 89 mL/min — ABNORMAL LOW (ref >90)
Glucose, Bld: 99 mg/dL (ref 70–99)
POTASSIUM: 3.7 mmol/L (ref 3.5–5.1)
SODIUM: 138 mmol/L (ref 135–145)

## 2014-12-04 LAB — CBC
HCT: 23.7 % — ABNORMAL LOW (ref 36.0–46.0)
Hemoglobin: 7.6 g/dL — ABNORMAL LOW (ref 12.0–15.0)
MCH: 30.5 pg (ref 26.0–34.0)
MCHC: 32.1 g/dL (ref 30.0–36.0)
MCV: 95.2 fL (ref 78.0–100.0)
Platelets: 552 10*3/uL — ABNORMAL HIGH (ref 150–400)
RBC: 2.49 MIL/uL — AB (ref 3.87–5.11)
RDW: 14.2 % (ref 11.5–15.5)
WBC: 15.4 10*3/uL — ABNORMAL HIGH (ref 4.0–10.5)

## 2014-12-04 LAB — LEGIONELLA ANTIGEN, URINE

## 2014-12-04 NOTE — Progress Notes (Signed)
Physical Therapy Treatment Patient Details Name: Peggy Welch MRN: [id number] DOB: 1940-11-14 Today's Date: 12/04/2014    History of Present Illness Pt is a 74 y.o. female s/p Rt THA    PT Comments    Pt was in bed; granddaughter bedside; pt sit up to EOB; adjusted walker to be able to maintain PWB status; pt OOB to ambulate in hall 20 ft; pt became tired; low hemoglobin; sat pt in chair and rolled back into room;   Follow Up Recommendations  Home health PT;Supervision/Assistance - 24 hour     Equipment Recommendations  None recommended by PT    Recommendations for Other Services       Precautions / Restrictions Precautions Precautions: Fall Restrictions Weight Bearing Restrictions: Yes RLE Weight Bearing: Partial weight bearing RLE Partial Weight Bearing Percentage or Pounds: 50    Mobility  Bed Mobility Overal bed mobility: Needs Assistance Bed Mobility: Supine to Sit Rolling: Min assist   Supine to sit: Min assist Sit to supine: Min assist   General bed mobility comments: assist  R leg onto  bed. Cue for hand placement   Transfers Overall transfer level: Needs assistance Equipment used: Rolling walker (2 wheeled) Transfers: Sit to/from Stand Sit to Stand: Min assist         General transfer comment: increased time and cues on hand placement   Ambulation/Gait Ambulation/Gait assistance: Min assist Ambulation Distance (Feet): 20 Feet Assistive device: Rolling walker (2 wheeled) Gait Pattern/deviations: Step-through pattern;Decreased stride length Gait velocity: decr   General Gait Details: Maintain PWB pt remembered.    Stairs            Wheelchair Mobility    Modified Rankin (Stroke Patients Only)       Balance                                    Cognition Arousal/Alertness: Awake/alert Behavior During Therapy: WFL for tasks assessed/performed Overall Cognitive Status: Within Functional Limits for tasks assessed                       Exercises      General Comments        Pertinent Vitals/Pain Pain Assessment: 0-10 Pain Score: 10-Worst pain ever (0 pain before ambulating 10 pain after ambulating) Pain Location: R HIP Pain Descriptors / Indicators: Discomfort Pain Intervention(s): Monitored during session;Repositioned;Ice applied (notified nurse for pain meds)    Home Living                      Prior Function            PT Goals (current goals can now be found in the care plan section) Progress towards PT goals: Progressing toward goals    Frequency  Min 5X/week    PT Plan Current plan remains appropriate    Co-evaluation             End of Session Equipment Utilized During Treatment: Gait belt Activity Tolerance: Patient tolerated treatment well Patient left: in chair;with call bell/phone within reach;with family/visitor present     Time: 6004-5997 PT Time Calculation (min) (ACUTE ONLY): 29 min  Charges:  $Gait Training: 8-22 mins $Therapeutic Activity: 8-22 mins                    G Codes:      Duric,Sreto, Student PTA  12/04/2014, 12:32 PM   Reviewed above and agree  Rica Koyanagi  PTA [facility]  Acute  Rehab Pager      [phone]

## 2014-12-04 NOTE — Evaluation (Signed)
Clinical/Bedside Swallow Evaluation Patient Details  Name: Peggy Welch MRN: [id number] Date of Birth: 1940-11-18  Today's Date: 12/04/2014 Time: SLP Start Time (ACUTE ONLY): 1413 SLP Stop Time (ACUTE ONLY): 1450 SLP Time Calculation (min) (ACUTE ONLY): 37 min  Past Medical History:  Past Medical History  Diagnosis Date  . Asthma   . Hypertension   . Hepatitis C     from blood transfusion  . Arthritis   . Asthma exacerbation 10/14/2011   Past Surgical History:  Past Surgical History  Procedure Laterality Date  . Joint replacement      Bilateral total knee replacements  . Cholecystectomy  02/21/2002  . Right rotator cuff repair  08/12/2006  . 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 Total hip arthroplasty Right 11/22/2014    Procedure: TOTAL HIP ARTHROPLASTY ANTERIOR APPROACH;  Surgeon: Marybelle Killings, MD;  Location: [facility];  Service: Orthopedics;  Laterality: Right;   HPI:  74 yo female adm to [facility] with AMS, fever.  Pt with recent orthopedic surgery, found to have right lower lobe pna.  Pt reports asthma issues yearly that cause problems due to seasonal issues.  Pt reports having been intubated/trached approximately 20 years ago - when extubated without vocal quality returning to baseline.     Assessment / Plan / Recommendation Clinical Impression  Pt presents with functional oropharyngeal swallow during clinical swallow evaluation.  SLP observed pt self feeding 4 ounces applesauce, 6 graham crackers, 4 ounces juice, and 2 ounces water.  No s/s of aspiration and timely swallow noted.  No indications of pharyngeal residuals either.  Per chart review, pt with epistaxis - ? If pt could've led to some aspiration of nasal secretions.   Pt denies dysphagia now or premorbidly.  Recommend regular/thin diet.  SLP advised pt and family to aspiration precautions.  Volitional cough noted x1 per SLP request, no reflexive cough for entire 30 minute session.  Pt reports she coughs when  they start her IV antibiotic not with po intake.      Aspiration Risk  Mild    Diet Recommendation Regular;Thin liquid   Liquid Administration via: Cup;Straw Medication Administration: Whole meds with liquid Supervision: Patient able to self feed Compensations: Slow rate;Small sips/bites Postural Changes and/or Swallow Maneuvers: Seated upright 90 degrees;Upright 30-60 min after meal    Other  Recommendations Oral Care Recommendations: Oral care BID   Follow Up Recommendations  None    Frequency and Duration     n./a   Pertinent Vitals/Pain Low grade temp, decreased     Swallow Study Prior Functional Status   see hhx    General Date of Onset: 12/04/14 HPI: 74 yo female adm to [facility] with AMS, fever.  Pt with recent orthopedic surgery at [facility], dc'd home and returned on 4/7 with AMS - sepsis.  Pt also with epistaxis per MD note.  Most recent CXR showed right lower lobe pna.  Pt reports asthma issues yearly that cause problems due to seasonal issues.  Pt reports having been intubated/trached approximately 20 years ago - when extubated without vocal quality returning to baseline.   Type of Study: Bedside swallow evaluation Diet Prior to this Study: NPO Temperature Spikes Noted: No Respiratory Status: Room air History of Recent Intubation: No Behavior/Cognition: Alert;Cooperative;Pleasant mood Oral Cavity - Dentition: Dentures, top;Dentures, bottom Self-Feeding Abilities: Able to feed self Patient Positioning: Upright in bed Baseline Vocal Quality: Low vocal intensity;Hoarse (dysphonic) Volitional Cough: Strong Volitional Swallow: Able to elicit  Oral/Motor/Sensory Function Overall Oral Motor/Sensory Function: Appears within functional limits for tasks assessed   Ice Chips Ice chips: Not tested   Thin Liquid Thin Liquid: Within functional limits Presentation: Cup;Straw    Nectar Thick Nectar Thick Liquid: Not tested   Honey Thick Honey Thick Liquid: Not tested   Puree  Puree: Within functional limits Presentation: Self Fed;Spoon   Solid   GO    Solid: Within functional limits Presentation: [place], [place] [facility] SLP [phone]

## 2014-12-04 NOTE — Progress Notes (Signed)
Pt noted to cough after thin liquids.  Pt/family educated YT:[id number] precautions.  Family states this is a "chronic cough.  Hgb is down to 7.6 this AM - was 8.1.  Text sent to Dr. Charlies Silvers and day RN Deloris Ping notified.  Pt is asymptomatic and vs and no bleeding noted.

## 2014-12-04 NOTE — Clinical Documentation Improvement (Signed)
Sodium as below.   Sodium chloride 0-.9% bolus 1,038ml once ordered  ordered 4/7.  Please identify any additional clinical conditions associated with the abnormal sodium level and document in your progress note / carry over to the discharge summary.    Component      Sodium  Latest Ref Rng      135 - 145 mmol/L  11/30/2014     7:13 PM 134 (L)  12/02/2014      129 (L)  12/03/2014      137  12/04/2014      138   Possible Clinical Conditions: -Hyponatremia -Other condition (please specify) -Unable to determine  Thank you, Mateo Flow, RN [phone] Clinical Documentation Specialist

## 2014-12-04 NOTE — Progress Notes (Addendum)
Patient ID: Peggy Welch, female   DOB: 1940-12-04, 74 y.o.   MRN: [id number] [facility] PROGRESS NOTE  Peggy Welch ESP:[id number] DOB: 21-Jan-1941 DOA: 11/30/2014 PCP: Elyn Peers, MD  Brief narrative:    53 -year-old female with past medical history of asthma, hypertension, hepatitis C, recent hip replacement 11/23/2014 who presented to [facility] long hospital with reports of cough, fever, shortness of breath for past couple of days prior to this admission. Patient was febrile, tachycardic on the admission. In addition her blood work was significant for leukocytosis. She was found to have right lower lobe pneumonia and she was started on broad-spectrum antibiotics for treatment of healthcare associated pneumonia. Her hospital course is complicated with possible aspiration. Swallowing evaluation ordered.   Assessment/Plan:    Principal Problem: Sepsis secondary to right lower lobe healthcare associated pneumonia versus aspiration pneumonitis / leukocytosis - Patient complains of shortness of breath and cough but unable to clear secretions. She is rhonchorous on physical exam. Concern is for aspiration. - On admission, chest x-ray showed possible right lower lobe pneumonia. Because of recent hospitalization she was started on broad-spectrum antibiotics, vancomycin and cefepime for treatment of healthcare associated pneumonia. This antibiotic regimen will cover for possible aspiration pneumonitis. - Keep nothing by mouth. Swallowing evaluation requested. - Respiratory cultures pending. Blood culture to date shows no growth. - HIV, legionella and strep pneumonia antigen are all negative. - Leukocytosis improving.   Active Problems:  Iron deficiency anemia - Continue iron supplement. - Transfuse if hemoglobin less than 7 - If hemoglobin drops to less than 7 we will hold aspirin     Hypokalemia - Likely secondary to HCTZ the patient was taken. Potassium supplemented and now  within normal limits.   HTN (hypertension) - Blood pressure 129/64. Stable. Antihypertensive medications on hold.   History of total hip replacement - Appreciate physical therapy evaluation     DVT Prophylaxis  - I will stop Heparin subcutaneous due to drop in hemoglobin. Order placed for SCD's bilaterally. - Aspirin twice a day on discharge per orthopedic recommendation.   Code Status: Full.  Family Communication:  plan of care discussed with the patient. Family updated at the bedside. Disposition Plan: Receiving antibiotics for pneumonia. Not stable for discharge at this time. Also require swallowing evaluation because of risk of aspiration.  IV access:  Peripheral IV  Procedures and diagnostic studies:    Dg Chest 2 View 11/30/2014  Abnormal lower lobe opacity, favor on the right, compatible with pneumonia. No pleural effusion identified.  Post treatment radiographs recommended to document resolution.     Medical Consultants:  None  Other Consultants:  Physical therapy SLP  IAnti-Infectives:   Cefepime 11/30/14---> Vancomycin 11/30/14--->   Leisa Lenz, MD  [facility] Pager [phone]  If 7PM-7AM, please contact night-coverage [email or web address] Password [email or web address] 12/04/2014, 12:00 PM   LOS: 4 days    HPI/Subjective: No acute overnight events. Congested, coughing, unable to clear secretions.  Objective: Filed Vitals:   12/03/14 1412 12/03/14 2212 12/04/14 0517 12/04/14 0843  BP: 100/54 128/77 129/64   Pulse: 101 101 95   Temp: 98.3 F (36.8 C) 99.9 F (37.7 C) 98.9 F (37.2 C)   TempSrc: Oral Oral Oral   Resp: 20 20 18    Height:      Weight:      SpO2: 99% 99% 97% 99%    Intake/Output Summary (Last 24 hours) at 12/04/14 1200 Last data filed at 12/04/14 0900  Gross per 24 hour  Intake  2400 ml  Output   1400 ml  Net   1000 ml    Exam:   General:  Pt is not in acute distress  Cardiovascular: tachycardic, S1/S2 (+)  Respiratory:  rhonchorous, wheezing in upper lung lobes   Abdomen: Soft, non tender, non distended, bowel sounds present  Extremities: No edema, pulses palpable bilaterally  Neuro: Grossly nonfocal  Data Reviewed: Basic Metabolic Panel:  Recent Labs Lab 11/30/14 1913 12/02/14 0500 12/03/14 0507 12/04/14 0510  NA 134* 129* 137 138  K 3.1* 3.1* 3.7 3.7  CL 96 95* 102 104  CO2 29 27 27 27   GLUCOSE 116* 119* 105* 99  BUN 14 8 7 7   CREATININE 0.70 0.57 0.52 0.55  CALCIUM 8.2* 7.6* 7.8* 7.7*  MG  --  1.9  --   --    Liver Function Tests:  Recent Labs Lab 11/30/14 1913  AST 29  ALT 20  ALKPHOS 54  BILITOT 0.9  PROT 7.0  ALBUMIN 3.0*   No results for input(s): LIPASE, AMYLASE in the last 168 hours. No results for input(s): AMMONIA in the last 168 hours. CBC:  Recent Labs Lab 11/30/14 1913 12/03/14 0507 12/04/14 0510  WBC 20.8* 19.1* 15.4*  NEUTROABS 15.1*  --   --   HGB 8.8* 8.1* 7.6*  HCT 28.0* 25.6* 23.7*  MCV 94.6 96.2 95.2  PLT 464* 551* 552*   Cardiac Enzymes: No results for input(s): CKTOTAL, CKMB, CKMBINDEX, TROPONINI in the last 168 hours. BNP: Invalid input(s): POCBNP CBG: No results for input(s): GLUCAP in the last 168 hours.  Culture, blood (routine x 2)     Status: None (Preliminary result)   Collection Time: 11/30/14  7:10 PM  Result Value Ref Range Status   Specimen Description   Final    RIGHT ANTECUBITAL Performed at [place] Requests   Final   Culture   Final           BLOOD CULTURE RECEIVED NO GROWTH TO DATE     Report Status PENDING  Incomplete  Culture, blood (routine x 2)     Status: None (Preliminary result)   Collection Time: 11/30/14  7:25 PM  Result Value Ref Range Status   Specimen Description   Final    RIGHT ANTECUBITAL Performed at [place] Requests   Final   Culture   Final           BLOOD CULTURE RECEIVED NO GROWTH TO DATE     Report Status PENDING  Incomplete  Urine culture      Status: None   Collection Time: 12/01/14  9:14 AM  Result Value Ref Range Status   Specimen Description URINE, CLEAN CATCH  Final   Special Requests NONE  Final   Culture NO GROWTH Performed at [facility]   Final   Report Status 12/02/2014 FINAL  Final  Culture, sputum-assessment     Status: None   Collection Time: 12/01/14  1:45 PM  Result Value Ref Range Status   Specimen Description SPUTUM  Final   Special Requests NONE  Final   Sputum evaluation   Final    THIS SPECIMEN IS ACCEPTABLE. RESPIRATORY CULTURE REPORT TO FOLLOW.   Report Status 12/01/2014 FINAL  Final  Culture, respiratory (NON-Expectorated)     Status: None   Collection Time: 12/01/14  1:45 PM  Result Value Ref Range Status   Specimen Description SPUTUM  Final   Special  Requests NONE  Final   Gram Stain   Final   Culture   Final    NORMAL OROPHARYNGEAL FLORA Performed at [facility]    Report Status 12/03/2014 FINAL  Final     Scheduled Meds: . aspirin EC  325 mg Oral Daily  . ceFEPime (MAXIPIME) IV  1 g Intravenous 3 times per day  . ferrous sulfate  325 mg Oral Q breakfast  . guaiFENesin  600 mg Oral BID  . guaiFENesin-dextromethorphan  5 mL Oral QID  . heparin  5,000 Units Subcutaneous 3 times per day  . polyethylene glycol  17 g Oral QODAY  . vancomycin  1,250 mg Intravenous Q12H

## 2014-12-05 DIAGNOSIS — D72829 Elevated white blood cell count, unspecified: Secondary | ICD-10-CM

## 2014-12-05 LAB — BASIC METABOLIC PANEL
ANION GAP: 6 (ref 5–15)
BUN: 5 mg/dL — ABNORMAL LOW (ref 6–23)
CALCIUM: 7.8 mg/dL — AB (ref 8.4–10.5)
CO2: 26 mmol/L (ref 19–32)
Chloride: 105 mmol/L (ref 96–112)
Creatinine, Ser: 0.53 mg/dL (ref 0.50–1.10)
GFR calc non Af Amer: >90 mL/min (ref >90)
Glucose, Bld: 100 mg/dL — ABNORMAL HIGH (ref 70–99)
Potassium: 3.3 mmol/L — ABNORMAL LOW (ref 3.5–5.1)
Sodium: 137 mmol/L (ref 135–145)

## 2014-12-05 LAB — CBC
HCT: 23.8 % — ABNORMAL LOW (ref 36.0–46.0)
HEMOGLOBIN: 7.6 g/dL — AB (ref 12.0–15.0)
MCH: 30.4 pg (ref 26.0–34.0)
MCHC: 31.9 g/dL (ref 30.0–36.0)
MCV: 95.2 fL (ref 78.0–100.0)
Platelets: 548 10*3/uL — ABNORMAL HIGH (ref 150–400)
RBC: 2.5 MIL/uL — ABNORMAL LOW (ref 3.87–5.11)
RDW: 14.2 % (ref 11.5–15.5)
WBC: 12.8 10*3/uL — ABNORMAL HIGH (ref 4.0–10.5)

## 2014-12-05 MED ORDER — ALBUTEROL SULFATE (2.5 MG/3ML) 0.083% IN NEBU
2.5000 mg | INHALATION_SOLUTION | Freq: Three times a day (TID) | RESPIRATORY_TRACT | Status: DC
  Administered 2014-12-05 – 2014-12-06 (×4): 2.5 mg via RESPIRATORY_TRACT
  Filled 2014-12-05 (×4): qty 3

## 2014-12-05 MED ORDER — AMLODIPINE BESYLATE 5 MG PO TABS
5.0000 mg | ORAL_TABLET | Freq: Every day | ORAL | Status: DC
  Administered 2014-12-05 – 2014-12-06 (×2): 5 mg via ORAL
  Filled 2014-12-05 (×2): qty 1

## 2014-12-05 MED ORDER — POTASSIUM CHLORIDE CRYS ER 20 MEQ PO TBCR
40.0000 meq | EXTENDED_RELEASE_TABLET | Freq: Once | ORAL | Status: AC
  Administered 2014-12-05: 40 meq via ORAL
  Filled 2014-12-05: qty 2

## 2014-12-05 NOTE — Progress Notes (Signed)
Patient ID: Peggy Welch, female   DOB: [birth year], 74 y.o.   MRN: [id number] [facility] PROGRESS NOTE  ANGELISSA SUPAN WEX:[id number] DOB: November 07, 1940 DOA: 11/30/2014 PCP: Elyn Peers, MD  Brief narrative:    36 -year-old female with past medical history of asthma, hypertension, hepatitis C, recent hip replacement 11/23/2014 who presented to [facility] hospital with reports of cough, fever, shortness of breath for past couple of days prior to this admission. On admission, patient had a fever, tachycardia and blood work was significant for leukocytosis. She was found to have right lower lobe pneumonia and broad-spectrum antibiotics were initiated for treatment of healthcare associated pneumonia.   Assessment/Plan:    Principal Problem: Sepsis secondary to right lower lobe healthcare associated pneumonia / leukocytosis - Respiratory status stable this morning. Patient reports coughing has improved. - Chest x-ray on the admission showed possible right lower lobe pneumonia. Because she had a recent hospitalization we started her on vancomycin and cefepime for treatment of healthcare associated pneumonia. - There was a concern that she may have aspiration pneumonia because of persistent cough and inability to clear secretions. Swallow evaluation done and recommended a regular thin diet. This morning she feels better and has no reports of difficulty swallowing and cough has improved. - Blood cultures so far show no growth. Respiratory culture growing normal oropharyngeal flora. - HIV, legionella and strep pneumonia antigen are all negative. - Leukocytosis improved.  Active Problems:  Iron deficiency anemia - Hemoglobin stable at 7.6. Continue iron supplementation. - We will transfuse if hemoglobin is less than 7.    Hypokalemia - Secondary to diuretic the which patient was taking at home, HCTZ. Potassium supplemented. - HCTZ on hold. Potassium within normal limits.   HTN  (hypertension) - Blood pressure 142/73 this morning. Will start low-dose Norvasc and see if patient tolerates this.   History of total hip replacement - Home with home health physical therapy likely in next 24 hours.   DVT Prophylaxis  - Patient will need twice a day aspirin on discharge per orthopedic surgery recommendations considering she had recent total hip replacement.   Code Status: Full.  Family Communication:  Family not at the bedside. Disposition Plan: Home in the next 24 hours. Currently on vancomycin and cefepime for healthcare associated pneumonia. We will switch to Levaquin tomorrow prior to discharge.  IV access:  Peripheral IV  Procedures and diagnostic studies:    Dg Chest 2 View 11/30/2014  Abnormal lower lobe opacity, favor on the right, compatible with pneumonia. No pleural effusion identified.  Post treatment radiographs recommended to document resolution.     Medical Consultants:  None  Other Consultants:  Physical therapy SLP  IAnti-Infectives:   Cefepime 11/30/14---> Vancomycin 11/30/14--->   Leisa Lenz, MD  [facility] Pager [phone]  If 7PM-7AM, please contact night-coverage [email or web address] Password [email or web address] 12/05/2014, 11:26 AM   LOS: 5 days    HPI/Subjective: Patient reports feeling little tired. No overnight events. No reports of difficulty swallowing. Coughing improved.  Objective: Filed Vitals:   12/04/14 2039 12/05/14 0031 12/05/14 0448 12/05/14 0907  BP: 141/83  142/73   Pulse: 90  113   Temp: 98.9 F (37.2 C)  99.1 F (37.3 C)   TempSrc: Oral  Oral   Resp: 16  20   Height:      Weight:      SpO2: 99% 98% 97% 97%    Intake/Output Summary (Last 24 hours) at 12/05/14 1126 Last data filed at 12/05/14 0900  Gross per 24 hour  Intake   1300 ml  Output    500 ml  Net    800 ml    Exam:   General:  Pt is awake, alert  Cardiovascular: Rate control, appreciate S1, S2  Respiratory: Bilateral air entry, no  wheezing  Abdomen: Nontender abdomen, non-distended, appreciate bowel sounds  Extremities: No leg swelling, bilateral pulses palpable  Neuro: No focal deficits  Data Reviewed: Basic Metabolic Panel:  Recent Labs Lab 11/30/14 1913 12/02/14 0500 12/03/14 0507 12/04/14 0510 12/05/14 0504  NA 134* 129* 137 138 137  K 3.1* 3.1* 3.7 3.7 3.3*  CL 96 95* 102 104 105  CO2 29 27 27 27 26   GLUCOSE 116* 119* 105* 99 100*  BUN 14 8 7 7  5*  CREATININE 0.70 0.57 0.52 0.55 0.53  CALCIUM 8.2* 7.6* 7.8* 7.7* 7.8*  MG  --  1.9  --   --   --    Liver Function Tests:  Recent Labs Lab 11/30/14 1913  AST 29  ALT 20  ALKPHOS 54  BILITOT 0.9  PROT 7.0  ALBUMIN 3.0*   No results for input(s): LIPASE, AMYLASE in the last 168 hours. No results for input(s): AMMONIA in the last 168 hours. CBC:  Recent Labs Lab 11/30/14 1913 12/03/14 0507 12/04/14 0510 12/05/14 0504  WBC 20.8* 19.1* 15.4* 12.8*  NEUTROABS 15.1*  --   --   --   HGB 8.8* 8.1* 7.6* 7.6*  HCT 28.0* 25.6* 23.7* 23.8*  MCV 94.6 96.2 95.2 95.2  PLT 464* 551* 552* 548*   Cardiac Enzymes: No results for input(s): CKTOTAL, CKMB, CKMBINDEX, TROPONINI in the last 168 hours. BNP: Invalid input(s): POCBNP CBG: No results for input(s): GLUCAP in the last 168 hours.  Culture, blood (routine x 2)     Status: None (Preliminary result)   Collection Time: 11/30/14  7:10 PM  Result Value Ref Range Status   Specimen Description   Final    RIGHT ANTECUBITAL Performed at [place] Requests   Final   Culture   Final           BLOOD CULTURE RECEIVED NO GROWTH TO DATE     Report Status PENDING  Incomplete  Culture, blood (routine x 2)     Status: None (Preliminary result)   Collection Time: 11/30/14  7:25 PM  Result Value Ref Range Status   Specimen Description   Final    RIGHT ANTECUBITAL Performed at [place] Requests   Final   Culture   Final           BLOOD CULTURE RECEIVED  NO GROWTH TO DATE     Report Status PENDING  Incomplete  Urine culture     Status: None   Collection Time: 12/01/14  9:14 AM  Result Value Ref Range Status   Specimen Description URINE, CLEAN CATCH  Final   Special Requests NONE  Final   Culture NO GROWTH Performed at [facility]   Final   Report Status 12/02/2014 FINAL  Final  Culture, sputum-assessment     Status: None   Collection Time: 12/01/14  1:45 PM  Result Value Ref Range Status   Specimen Description SPUTUM  Final   Special Requests NONE  Final   Sputum evaluation   Final    THIS SPECIMEN IS ACCEPTABLE. RESPIRATORY CULTURE REPORT TO FOLLOW.   Report Status 12/01/2014 FINAL  Final  Culture, respiratory (  NON-Expectorated)     Status: None   Collection Time: 12/01/14  1:45 PM  Result Value Ref Range Status   Specimen Description SPUTUM  Final   Special Requests NONE  Final   Gram Stain   Final   Culture   Final    NORMAL OROPHARYNGEAL FLORA Performed at [facility]    Report Status 12/03/2014 FINAL  Final     Scheduled Meds: . albuterol  2.5 mg Nebulization TID  . aspirin EC  325 mg Oral Daily  . ceFEPime (MAXIPIME) IV  1 g Intravenous 3 times per day  . ferrous sulfate  325 mg Oral Q breakfast  . guaiFENesin  600 mg Oral BID  . guaiFENesin-dextromethorphan  5 mL Oral QID  . polyethylene glycol  17 g Oral QODAY  . potassium chloride  40 mEq Oral Once  . vancomycin  1,250 mg Intravenous Q12H

## 2014-12-05 NOTE — Progress Notes (Signed)
Physical Therapy Treatment Patient Details Name: Peggy Welch MRN: [id number] DOB: 30-Jun-1941 Today's Date: 12/05/2014    History of Present Illness Pt is a 74 y.o. female s/p Rt THA    PT Comments    Pt was in bed. Resp Therapy came in admin albuterol; pt c/o of headache; pt from supine to EOB; OOB to ambulate with walker 100 ft (50 ft x 2) rest in between chair; returned patient back to bed.   Follow Up Recommendations  Home health PT;Supervision/Assistance - 24 hour     Equipment Recommendations  None recommended by PT    Recommendations for Other Services       Precautions / Restrictions Precautions Precautions: Fall Restrictions Weight Bearing Restrictions: Yes RLE Weight Bearing: Partial weight bearing RLE Partial Weight Bearing Percentage or Pounds: 50    Mobility  Bed Mobility Overal bed mobility: Needs Assistance Bed Mobility: Supine to Sit     Supine to sit: Min guard Sit to supine: Min guard   General bed mobility comments: Minial Assist with R Leg onto bed  Transfers Overall transfer level: Needs assistance Equipment used: Rolling walker (2 wheeled) Transfers: Sit to/from Stand Sit to Stand: Min assist         General transfer comment: increased time and cues on hand placement on walker hand pads   Ambulation/Gait Ambulation/Gait assistance: Min assist Ambulation Distance (Feet): 100 Feet (50 x 2) Assistive device: Rolling walker (2 wheeled) Gait Pattern/deviations: Step-through pattern;Decreased stride length Gait velocity: decr   General Gait Details: Maintain PWB pt remembered.    Stairs            Wheelchair Mobility    Modified Rankin (Stroke Patients Only)       Balance                                    Cognition Arousal/Alertness: Awake/alert Behavior During Therapy: WFL for tasks assessed/performed Overall Cognitive Status: Within Functional Limits for tasks assessed                       Exercises      General Comments        Pertinent Vitals/Pain Pain Assessment: No/denies pain (Pt c/o headache)    Home Living                      Prior Function            PT Goals (current goals can now be found in the care plan section) Progress towards PT goals: Progressing toward goals    Frequency  Min 5X/week    PT Plan Current plan remains appropriate    Co-evaluation             End of Session Equipment Utilized During Treatment: Gait belt Activity Tolerance: Patient tolerated treatment well Patient left: in bed;with family/visitor present;with call bell/phone within reach     Time:   0254-2706    Charges:    1 gt 1 ta                    G Codes:      Duric,[place] Student PTA  12/05/2014, 3:34 PM   Reviewed and agree with above  Rica Koyanagi  PTA [facility]  Acute  Rehab Pager      [phone]

## 2014-12-05 NOTE — Progress Notes (Signed)
Administered prn BD per patient request. Patient states she is "asthmatic and no one is listening to me". She states she has trouble breathing this time of year, every year. RT treatment protocol assessment complete. Scheduled BD ordered according to acuity score.

## 2014-12-06 LAB — BASIC METABOLIC PANEL
ANION GAP: 7 (ref 5–15)
BUN: 5 mg/dL — ABNORMAL LOW (ref 6–23)
CALCIUM: 8.3 mg/dL — AB (ref 8.4–10.5)
CO2: 27 mmol/L (ref 19–32)
CREATININE: 0.54 mg/dL (ref 0.50–1.10)
Chloride: 104 mmol/L (ref 96–112)
Glucose, Bld: 105 mg/dL — ABNORMAL HIGH (ref 70–99)
Potassium: 3.9 mmol/L (ref 3.5–5.1)
SODIUM: 138 mmol/L (ref 135–145)

## 2014-12-06 LAB — CBC
HCT: 27.7 % — ABNORMAL LOW (ref 36.0–46.0)
Hemoglobin: 8.6 g/dL — ABNORMAL LOW (ref 12.0–15.0)
MCH: 30.2 pg (ref 26.0–34.0)
MCHC: 31 g/dL (ref 30.0–36.0)
MCV: 97.2 fL (ref 78.0–100.0)
Platelets: 657 10*3/uL — ABNORMAL HIGH (ref 150–400)
RBC: 2.85 MIL/uL — ABNORMAL LOW (ref 3.87–5.11)
RDW: 14.6 % (ref 11.5–15.5)
WBC: 12.6 10*3/uL — AB (ref 4.0–10.5)

## 2014-12-06 MED ORDER — LEVOFLOXACIN 500 MG PO TABS: 500.0000 mg | ORAL_TABLET | Freq: Every day | ORAL | Status: DC

## 2014-12-06 MED ORDER — FERROUS SULFATE 325 (65 FE) MG PO TABS: 325.0000 mg | ORAL_TABLET | Freq: Every day | ORAL | Status: DC

## 2014-12-06 MED ORDER — LEVOFLOXACIN 750 MG PO TABS
750.0000 mg | ORAL_TABLET | Freq: Every day | ORAL | Status: DC
  Administered 2014-12-06: 750 mg via ORAL
  Filled 2014-12-06: qty 1

## 2014-12-06 NOTE — Progress Notes (Signed)
Pt was active with [facility] and will continue with HHRN/PT/NA.

## 2014-12-06 NOTE — Discharge Summary (Signed)
Physician Discharge Summary  Peggy Welch SWN:[id number] DOB: 1941/07/08 DOA: 11/30/2014  PCP: Elyn Peers, MD  Admit date: 11/30/2014 Discharge date: 12/06/2014  Recommendations for Outpatient Follow-up:  1. Patient discharged with Levaquin 500 mg once a day for 4 more days on discharge which would complete a total of ten-day treatment with antibiotics. 2. No new changes in medications. Patient may continue previous blood pressure medication. 3. Home health physical therapy order placed.  Discharge Diagnoses:  Principal Problem:   Sepsis Active Problems:   Hypokalemia   HTN (hypertension)   History of total hip replacement   HCAP (healthcare-associated pneumonia)   Right lower lobe pneumonia   Postoperative anemia due to acute blood loss   Cough    Discharge Condition: stable   Diet recommendation: as tolerated   History of present illness:  1 -year-old female with past medical history of asthma, hypertension, hepatitis C, recent hip replacement 11/23/2014 who presented to [facility] long hospital with reports of cough, fever, shortness of breath for past couple of days prior to this admission. On admission, patient had a fever, tachycardia and blood work was significant for leukocytosis. She was found to have right lower lobe pneumonia and broad-spectrum antibiotics were initiated for treatment of healthcare associated pneumonia.   Assessment/Plan:    Principal Problem: Sepsis secondary to right lower lobe healthcare associated pneumonia / leukocytosis - Sepsis criteria met on the admission. Patient was on broad-spectrum antibiotics vancomycin and cefepime. Chest x-ray on the admission showed right lower lobe pneumonia. Because patient had recent hospitalization she was treated for healthcare associated pneumonia. - Concern throughout the hospital stay was that patient may have aspiration pneumonitis because of persistent cough. Her cough subsequently improved. She was  seen by swallow evaluation team. No difficulty swallowing. - Blood cultures show no growth to date. Respiratory culture shows normal oropharyngeal flora. HIV, legionella, strep pneumonia are all negative. - Leukocytosis improved since the admission. - Respiratory status remains stable.  Active Problems:  Iron deficiency anemia - Hemoglobin stable at 7.6. Continue iron supplementation. - Patient will continue aspirin twice daily as recommended by orthopedic surgery for recent hip replacement.    Hypokalemia - Secondary to HCTZ. Patient is on potassium supplementation.   HTN (hypertension) - Patient may resume HCTZ on discharge.   History of total hip replacement - Order placed for home health physical therapy per physical therapy evaluation and recommendation.   DVT Prophylaxis  - Patient needs twice a day aspirin on discharge per orthopedic surgery recommendation because of recent total hip replacement.   Code Status: Full.  Family Communication: Family not at the bedside. Tried to call number listed in Epic but seems to be wrong number. Sister is Buyer, retail.   IV access:  Peripheral IV  Procedures and diagnostic studies:   Dg Chest 2 View 11/30/2014 Abnormal lower lobe opacity, favor on the right, compatible with pneumonia. No pleural effusion identified. Post treatment radiographs recommended to document resolution.   Medical Consultants:  None  Other Consultants:  Physical therapy SLP  IAnti-Infectives:   Cefepime 11/30/14---> 12/06/2014  Vancomycin 11/30/14---> 12/06/2014   Signed:  Leisa Lenz, MD  [facility] 12/06/2014, 9:05 AM  Pager #: [phone]  Time spent coordinating discharge, discharge summary, home health orders - more than 30 minutes.    Discharge Exam: Filed Vitals:   12/06/14 0553  BP: 137/68  Pulse: 87  Temp: 98.7 F (37.1 C)  Resp: 16   Filed Vitals:   12/05/14 1500 12/05/14 2027 12/05/14 2055  12/06/14  0553  BP: 115/54 119/61  137/68  Pulse: 92 92  87  Temp: 98.4 F (36.9 C) 99.3 F (37.4 C)  98.7 F (37.1 C)  TempSrc: Oral Oral  Oral  Resp: _0 Height:      Weight:      SpO2: 97% 98% 97% 98%    General: Pt is alert, follows commands appropriately, not in acute distress Cardiovascular: Regular rate and rhythm, S1/S2 +, no murmurs Respiratory: Clear to auscultation bilaterally, no wheezing, no crackles, no rhonchi Abdominal: Soft, non tender, non distended, bowel sounds +, no guarding Extremities: no edema, no cyanosis, pulses palpable bilaterally DP and PT Neuro: Grossly nonfocal  Discharge Instructions  Discharge Instructions    Call MD for:  difficulty breathing, headache or visual disturbances    Complete by:  As directed      Call MD for:  persistant nausea and vomiting    Complete by:  As directed      Call MD for:  severe uncontrolled pain    Complete by:  As directed      Diet - low sodium heart healthy    Complete by:  As directed      Discharge instructions    Complete by:  As directed   1. Take Levaquin for 4 more days on discharge for treatment of pneumonia.     Increase activity slowly    Complete by:  As directed             Medication List    TAKE these medications        acetaminophen 650 MG CR tablet  Commonly known as:  TYLENOL  Take 650 mg by mouth every 8 (eight) hours as needed for pain (pain).     albuterol (2.5 MG/3ML) 0.083% nebulizer solution  Commonly known as:  PROVENTIL  Take 2.5 mg by nebulization every 4 (four) hours as needed for wheezing or shortness of breath (wheezing).     alendronate 70 MG tablet  Commonly known as:  FOSAMAX  Take 70 mg by mouth every 7 (seven) days. On Tuesdays. Take with a full glass of water on an empty stomach.     aspirin EC 325 MG tablet  Take 1 tablet (325 mg total) by mouth 2 (two) times daily.     B-complex with vitamin C tablet  Take 1 tablet by mouth daily.     ferrous sulfate 325  (65 FE) MG tablet  Take 1 tablet (325 mg total) by mouth daily with breakfast.     hydrochlorothiazide 25 MG tablet  Commonly known as:  HYDRODIURIL  Take 25 mg by mouth daily.     levofloxacin 500 MG tablet  Commonly known as:  LEVAQUIN  Take 1 tablet (500 mg total) by mouth daily.     methocarbamol 500 MG tablet  Commonly known as:  ROBAXIN  Take 1 tablet (500 mg total) by mouth every 6 (six) hours as needed for muscle spasms.     oxyCODONE-acetaminophen 5-325 MG per tablet  Commonly known as:  ROXICET  Take 1 tablet by mouth every 6 (six) hours as needed for severe pain.     polyethylene glycol packet  Commonly known as:  MIRALAX / GLYCOLAX  Take 17 g by mouth daily.     potassium chloride SA 20 MEQ tablet  Commonly known as:  K-DUR,KLOR-CON  Take 20 mEq by mouth daily.  Follow-up Information    Follow up with Elyn Peers, MD. Schedule an appointment as soon as possible for a visit in 1 week.   Specialty:  Family Medicine   Why:  Follow up appt after recent hospitalization   Contact information:   [address] [place] [address] [phone]        The results of significant diagnostics from this hospitalization (including imaging, microbiology, ancillary and laboratory) are listed below for reference.    Significant Diagnostic Studies: Dg Chest 2 View  11/30/2014   CLINICAL DATA:  74 year old female with fever and cough for 4 days. Initial encounter. Chronic hepatitis-C.  EXAM: CHEST  2 VIEW  COMPARISON:  11/10/2014 and earlier.  FINDINGS: Semi upright AP and lateral views of the chest. More lordotic positioning today. Stable cardiac size and mediastinal contours. Abnormally increased lower lobe region opacity on the lateral view, not well correlated on the frontal view. No pleural effusion. No pneumothorax or pulmonary edema. Advanced degenerative changes at both shoulders. No acute osseous abnormality identified.  IMPRESSION: Abnormal lower  lobe opacity, favor on the right, compatible with pneumonia. No pleural effusion identified.  Post treatment radiographs recommended to document resolution.   Electronically Signed   By: Genevie Ann M.D.   On: 11/30/2014 20:07   Dg Chest 2 View  11/10/2014   CLINICAL DATA:  Preoperative evaluation for hip surgery  EXAM: CHEST  2 VIEW  COMPARISON:  10/14/2011  FINDINGS: The heart size and mediastinal contours are within normal limits. Both lungs are clear. The visualized skeletal structures are unremarkable.  IMPRESSION: No active cardiopulmonary disease.   Electronically Signed   By: Inez Catalina M.D.   On: 11/10/2014 14:56   Dg Hip Operative Unilat With Pelvis Right  11/22/2014   CLINICAL DATA:  Status post anterior approach right total hip joint replacement, fluoro spot views  EXAM: OPERATIVE right HIP (WITH PELVIS IF PERFORMED)  VIEWS  TECHNIQUE: Fluoroscopic spot image(s) were submitted for interpretation post-operatively.  COMPARISON:  None.  FINDINGS: The fluoro time was not reported. The patient has undergone right total hip joint prosthesis placement. Radiographic positioning of the prosthetic component is good. The surrounding native bone is unremarkable.  IMPRESSION: The patient has undergone right hip joint replacement without evidence of immediate postprocedure complication.   Electronically Signed   By: David  Martinique   On: 11/22/2014 15:25    Microbiology: Culture, blood (routine x 2)     Status: None (Preliminary result)   Collection Time: 11/30/14  7:10 PM  Result Value Ref Range Status   Specimen Description   Final    RIGHT ANTECUBITAL Performed at [facility] Requests   Final   Culture   Final           BLOOD CULTURE RECEIVED NO GROWTH TO DATE CULTURE WILL BE HELD FOR 5 DAYS BEFORE ISSUING A FINAL NEGATIVE REPORT Performed at [facility]    Report Status PENDING  Incomplete  Culture, blood (routine x 2)     Status: None (Preliminary result)    Collection Time: 11/30/14  7:25 PM  Result Value Ref Range Status   Specimen Description   Final    RIGHT ANTECUBITAL Performed at [facility] Requests   Final   Culture   Final           BLOOD CULTURE RECEIVED NO GROWTH TO DATE CULTURE WILL BE HELD FOR 5 DAYS  BEFORE ISSUING A FINAL NEGATIVE REPORT Performed at [facility]    Report Status PENDING  Incomplete  Urine culture     Status: None   Collection Time: 12/01/14  9:14 AM  Result Value Ref Range Status   Specimen Description URINE, CLEAN CATCH  Final   Special Requests NONE  Final   Culture NO GROWTH Performed at [facility]   Final   Report Status 12/02/2014 FINAL  Final  Culture, sputum-assessment     Status: None   Collection Time: 12/01/14  1:45 PM  Result Value Ref Range Status   Specimen Description SPUTUM  Final   Special Requests NONE  Final   Sputum evaluation   Final    THIS SPECIMEN IS ACCEPTABLE. RESPIRATORY CULTURE REPORT TO FOLLOW.   Report Status 12/01/2014 FINAL  Final  Culture, respiratory (NON-Expectorated)     Status: None   Collection Time: 12/01/14  1:45 PM  Result Value Ref Range Status   Specimen Description SPUTUM  Final   Special Requests NONE  Final   Gram Stain   Final   Culture   Final    NORMAL OROPHARYNGEAL FLORA Performed at [facility]    Report Status 12/03/2014 FINAL  Final     Labs: Basic Metabolic Panel:  Recent Labs Lab 11/30/14 1913 12/02/14 0500 12/03/14 0507 12/04/14 0510 12/05/14 0504  NA 134* 129* 137 138 137  K 3.1* 3.1* 3.7 3.7 3.3*  CL 96 95* 102 104 105  CO2 _0 GLUCOSE 116* 119* 105* 99 100*  BUN _1 5*  CREATININE 0.70 0.57 0.52 0.55 0.53  CALCIUM 8.2* 7.6* 7.8* 7.7* 7.8*  MG  --  1.9  --   --   --    Liver Function Tests:  Recent Labs Lab 11/30/14 1913  AST 29  ALT 20  ALKPHOS 54  BILITOT 0.9  PROT 7.0  ALBUMIN 3.0*   No results for input(s): LIPASE, AMYLASE in the last 168  hours. No results for input(s): AMMONIA in the last 168 hours. CBC:  Recent Labs Lab 11/30/14 1913 12/03/14 0507 12/04/14 0510 12/05/14 0504  WBC 20.8* 19.1* 15.4* 12.8*  NEUTROABS 15.1*  --   --   --   HGB 8.8* 8.1* 7.6* 7.6*  HCT 28.0* 25.6* 23.7* 23.8*  MCV 94.6 96.2 95.2 95.2  PLT 464* 551* 552* 548*   Cardiac Enzymes: No results for input(s): CKTOTAL, CKMB, CKMBINDEX, TROPONINI in the last 168 hours. BNP: BNP (last 3 results) No results for input(s): BNP in the last 8760 hours.  ProBNP (last 3 results) No results for input(s): PROBNP in the last 8760 hours.  CBG: No results for input(s): GLUCAP in the last 168 hours.  Time coordinating discharge: Over 30 minutes

## 2014-12-06 NOTE — Progress Notes (Signed)
ANTIBIOTIC CONSULT NOTE  Pharmacy Consult for Levaquin Indication: pneumonia  No Known Allergies  Patient Measurements: Height: 5\' 4"  (162.6 cm) Weight: 197 lb 15.6 oz (89.8 kg) IBW/kg (Calculated) : 54.7  Vital Signs: Temp: 98.7 F (37.1 C) (04/13 0553) Temp Source: Oral (04/13 0553) BP: 137/68 mmHg (04/13 0553) Pulse Rate: 87 (04/13 0553) Intake/Output from previous day: 04/12 0701 - 04/13 0700 In: 1190 [P.O.:840; IV Piggyback:350] Out: -  Intake/Output from this shift:    Labs:  Recent Labs  12/04/14 0510 12/05/14 0504 12/06/14 0927  WBC 15.4* 12.8* 12.6*  HGB 7.6* 7.6* 8.6*  PLT 552* 548* 657*  CREATININE 0.55 0.53  --    Estimated Creatinine Clearance: 65.9 mL/min (by C-G formula based on Cr of 0.53).  Recent Labs  12/03/14 1111  VANCOTROUGH 9.8*     Microbiology: Recent Results (from the past 720 hour(s))  Surgical pcr screen     Status: Abnormal   Collection Time: 11/10/14 12:46 PM  Result Value Ref Range Status   MRSA, PCR NEGATIVE NEGATIVE Final   Staphylococcus aureus POSITIVE (A) NEGATIVE Final    Comment:        The Xpert SA Assay (FDA approved for NASAL specimens in patients over 76 years of age), is one component of a comprehensive surveillance program.  Test performance has been validated by [facility] for patients greater than or equal to 76 year old. It is not intended to diagnose infection nor to guide or monitor treatment.   Culture, blood (routine x 2)     Status: None (Preliminary result)   Collection Time: 11/30/14  7:10 PM  Result Value Ref Range Status   Specimen Description   Final    RIGHT ANTECUBITAL Performed at [facility] Requests   Final    BOTTLES DRAWN AEROBIC AND ANAEROBIC 5CC Performed at [facility]    Culture   Final           BLOOD CULTURE RECEIVED NO GROWTH TO DATE CULTURE WILL BE HELD FOR 5 DAYS BEFORE ISSUING A FINAL NEGATIVE REPORT Performed at [facility]    Report Status PENDING  Incomplete  Culture, blood (routine x 2)     Status: None (Preliminary result)   Collection Time: 11/30/14  7:25 PM  Result Value Ref Range Status   Specimen Description   Final    RIGHT ANTECUBITAL Performed at [facility] Requests   Final    BOTTLES DRAWN AEROBIC AND ANAEROBIC 5CC Performed at [facility]    Culture   Final           BLOOD CULTURE RECEIVED NO GROWTH TO DATE CULTURE WILL BE HELD FOR 5 DAYS BEFORE ISSUING A FINAL NEGATIVE REPORT Performed at [facility]    Report Status PENDING  Incomplete  Urine culture     Status: None   Collection Time: 12/01/14  9:14 AM  Result Value Ref Range Status   Specimen Description URINE, CLEAN CATCH  Final   Special Requests NONE  Final   Colony Count NO GROWTH Performed at [facility]   Final   Culture NO GROWTH Performed at [facility]   Final   Report Status 12/02/2014 FINAL  Final  Culture, sputum-assessment     Status: None   Collection Time: 12/01/14  1:45 PM  Result Value Ref Range Status   Specimen Description SPUTUM  Final   Special Requests NONE  Final   Sputum evaluation   Final    THIS SPECIMEN IS ACCEPTABLE. RESPIRATORY CULTURE REPORT TO FOLLOW.   Report Status 12/01/2014 FINAL  Final  Culture, respiratory (NON-Expectorated)     Status: None   Collection Time: 12/01/14  1:45 PM  Result Value Ref Range Status   Specimen Description SPUTUM  Final   Special Requests NONE  Final   Gram Stain   Final    FEW WBC PRESENT,BOTH PMN AND MONONUCLEAR RARE SQUAMOUS EPITHELIAL CELLS PRESENT FEW GRAM POSITIVE RODS RARE GRAM NEGATIVE RODS Performed at [facility]    Culture   Final    NORMAL OROPHARYNGEAL FLORA Performed at [facility]    Report Status 12/03/2014 FINAL  Final   Assessment: 74 yoF with RLL lobe infiltrate, to ED from home with fever, cough. Recent R THA 3/30. Begin Vancomycin per pharmacy for HCAP.  Pharmacy  consulted to change to Levaquin PO for discharge planning.  4/7 >> Vancomycin >> 4/13 4/7 >> Cefepime >> 4/13 4/13 >> Levaquin >>  Goal of Therapy:   Eradication of infection  Appropriate antibiotic dosing for indication and renal function  Plan:   Levaquin 750mg  PO q24h  Follow clinical course, renal function, and LOT  Gretta Arab PharmD, BCPS Pager [phone] 12/06/2014 10:02 AM

## 2014-12-06 NOTE — Discharge Instructions (Signed)

## 2014-12-07 LAB — CULTURE, BLOOD (ROUTINE X 2)
CULTURE: NO GROWTH
Culture: NO GROWTH

## 2014-12-07 NOTE — Discharge Summary (Signed)
Patient ID: Peggy Welch MRN: [id number] DOB/AGE: July 04, 1941 74 y.o.  Admit date: 11/22/2014 Discharge date: 12/07/2014  Admission Diagnoses:  Active Problems:   History of total hip replacement   Discharge Diagnoses:  Active Problems:   History of total hip replacement  status post Procedure(s): TOTAL HIP ARTHROPLASTY ANTERIOR APPROACH  Past Medical History  Diagnosis Date  . Asthma   . Hypertension   . Hepatitis C     from blood transfusion  . Arthritis   . Asthma exacerbation 10/14/2011    Surgeries: Procedure(s): TOTAL HIP ARTHROPLASTY ANTERIOR APPROACH on 11/22/2014   Consultants:    Discharged Condition: Improved  Hospital Course: Peggy Welch is an 74 y.o. 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 Patient failed conservative treatments (please see the history and physical for the specifics) and had severe unremitting pain that affects sleep, daily activities and work/hobbies. After pre-op clearance, the patient was taken to the operating room on 11/22/2014 and underwent  Procedure(s): TOTAL HIP ARTHROPLASTY ANTERIOR APPROACH.    Patient was given perioperative antibiotics: Anti-infectives    Start     Dose/Rate Route Frequency Ordered Stop   11/22/14 0600  ceFAZolin (ANCEF) IVPB 2 g/50 mL premix     2 g 100 mL/hr over 30 Minutes Intravenous On call to O.R. 11/21/14 1255 11/22/14 1301       Patient was given sequential compression devices and early ambulation to prevent DVT.   Patient benefited maximally from hospital stay and there were no complications. At the time of discharge, the patient was urinating/moving their bowels without difficulty, tolerating a regular diet, pain is controlled with oral pain medications and they have been cleared by PT/OT.   Recent vital signs: No data found.    Recent laboratory studies:  Recent Labs  12/05/14 0504 12/06/14 0927  WBC 12.8* 12.6*  HGB 7.6* 8.6*  HCT 23.8* 27.7*  PLT 548* 657*  NA  137 138  K 3.3* 3.9  CL 105 104  CO2 26 27  BUN 5* 5*  CREATININE 0.53 0.54  GLUCOSE 100* 105*  CALCIUM 7.8* 8.3*     Discharge Medications:     Medication List    STOP taking these medications        HYDROcodone-acetaminophen 5-325 MG per tablet  Commonly known as:  NORCO/VICODIN     ibuprofen 800 MG tablet  Commonly known as:  ADVIL,MOTRIN      TAKE these medications        alendronate 70 MG tablet  Commonly known as:  FOSAMAX  Take 70 mg by mouth every 7 (seven) days. On Tuesdays. Take with a full glass of water on an empty stomach.     aspirin EC 325 MG tablet  Take 1 tablet (325 mg total) by mouth 2 (two) times daily.     B-complex with vitamin C tablet  Take 1 tablet by mouth daily.     hydrochlorothiazide 25 MG tablet  Commonly known as:  HYDRODIURIL  Take 25 mg by mouth daily.     methocarbamol 500 MG tablet  Commonly known as:  ROBAXIN  Take 1 tablet (500 mg total) by mouth every 6 (six) hours as needed for muscle spasms.     oxyCODONE-acetaminophen 5-325 MG per tablet  Commonly known as:  ROXICET  Take 1 tablet by mouth every 6 (six) hours as needed for severe pain.     polyethylene glycol packet  Commonly known as:  MIRALAX / GLYCOLAX  Take 17 g by mouth daily.     potassium chloride SA 20 MEQ tablet  Commonly known as:  K-DUR,KLOR-CON  Take 20 mEq by mouth daily.        Diagnostic Studies: Dg Chest 2 View  11/30/2014   CLINICAL DATA:  74 year old female with fever and cough for 4 days. Initial encounter. Chronic hepatitis-C.  EXAM: CHEST  2 VIEW  COMPARISON:  11/10/2014 and earlier.  FINDINGS: Semi upright AP and lateral views of the chest. More lordotic positioning today. Stable cardiac size and mediastinal contours. Abnormally increased lower lobe region opacity on the lateral view, not well correlated on the frontal view. No pleural effusion. No pneumothorax or pulmonary edema. Advanced degenerative changes at both shoulders. No acute osseous  abnormality identified.  IMPRESSION: Abnormal lower lobe opacity, favor on the right, compatible with pneumonia. No pleural effusion identified.  Post treatment radiographs recommended to document resolution.   Electronically Signed   By: Genevie Ann M.D.   On: 11/30/2014 20:07   Dg Chest 2 View  11/10/2014   CLINICAL DATA:  Preoperative evaluation for hip surgery  EXAM: CHEST  2 VIEW  COMPARISON:  10/14/2011  FINDINGS: The heart size and mediastinal contours are within normal limits. Both lungs are clear. The visualized skeletal structures are unremarkable.  IMPRESSION: No active cardiopulmonary disease.   Electronically Signed   By: Inez Catalina M.D.   On: 11/10/2014 14:56   Dg Hip Operative Unilat With Pelvis Right  11/22/2014   CLINICAL DATA:  Status post anterior approach right total hip joint replacement, fluoro spot views  EXAM: OPERATIVE right HIP (WITH PELVIS IF PERFORMED)  VIEWS  TECHNIQUE: Fluoroscopic spot image(s) were submitted for interpretation post-operatively.  COMPARISON:  None.  FINDINGS: The fluoro time was not reported. The patient has undergone right total hip joint prosthesis placement. Radiographic positioning of the prosthetic component is good. The surrounding native bone is unremarkable.  IMPRESSION: The patient has undergone right hip joint replacement without evidence of immediate postprocedure complication.   Electronically Signed   By: David  Martinique   On: 11/22/2014 15:25        Discharge Instructions    Call MD / Call 911    Complete by:  As directed   If you experience chest pain or shortness of breath, CALL 911 and be transported to the hospital emergency room.  If you develope a fever above 101.5 F, pus (white drainage) or increased drainage or redness at the wound, or calf pain, call your surgeon's office.     Constipation Prevention    Complete by:  As directed   Drink plenty of fluids.  Prune juice may be helpful.  You may use a stool softener, such as Colace (over  the counter) 100 mg twice a day.  Use MiraLax (over the counter) for constipation as needed.     Diet - low sodium heart healthy    Complete by:  As directed      Diet general    Complete by:  As directed      Driving restrictions    Complete by:  As directed   No driving while taking narcotic pain meds.     Follow the hip precautions as taught in Physical Therapy    Complete by:  As directed      Increase activity slowly as tolerated    Complete by:  As directed            Follow-up Information  Follow up with Marybelle Killings, MD In 2 weeks.   Specialty:  Orthopedic Surgery   Why:  For suture removal, For wound re-check   Contact information:   [place] [address] [phone]       Discharge Plan:  discharge to home  Disposition:     Signed: Lanae Crumbly  12/07/2014, 9:17 AM

## 2014-12-13 DIAGNOSIS — M25551 Pain in right hip: Secondary | ICD-10-CM | POA: Diagnosis not present

## 2014-12-13 DIAGNOSIS — M1611 Unilateral primary osteoarthritis, right hip: Secondary | ICD-10-CM | POA: Diagnosis not present

## 2015-01-17 DIAGNOSIS — M545 Low back pain: Secondary | ICD-10-CM | POA: Diagnosis not present

## 2015-02-19 ENCOUNTER — Other Ambulatory Visit: Payer: Self-pay

## 2015-02-27 ENCOUNTER — Other Ambulatory Visit: Payer: Self-pay | Admitting: Radiology

## 2015-03-01 ENCOUNTER — Other Ambulatory Visit: Payer: Self-pay | Admitting: Radiology

## 2015-03-01 DIAGNOSIS — C50911 Malignant neoplasm of unspecified site of right female breast: Secondary | ICD-10-CM

## 2015-03-05 ENCOUNTER — Telehealth: Payer: Self-pay | Admitting: *Deleted

## 2015-03-05 DIAGNOSIS — C50311 Malignant neoplasm of lower-inner quadrant of right female breast: Secondary | ICD-10-CM

## 2015-03-05 NOTE — Telephone Encounter (Signed)
Confirmed [facility] for 03/07/15 at 12N .  Instructions and contact information given.

## 2015-03-06 ENCOUNTER — Telehealth: Payer: Self-pay | Admitting: *Deleted

## 2015-03-06 NOTE — Telephone Encounter (Signed)
Received a call from pt stating she does not have breast cancer and wants to cancel her appt. Discussed with pt further options for appts to treat her breast condition. Pt request to see physician after her breast MRI on 03/08/15. This "will make me happy". Was able to have the pt agree to see a surgeon alone and that Teola Bradley will call her with the appt. Gave pt all surgeons names within the [facility] that she could possible see. Informed Jessica at [facility] pt wishes. Appts cancelled for 03/07/15 [facility]. All participants within [facility] for 7/13 were notified of appt cancellation. Encourage pt to call with needs, questions or concerns. Received verbal understanding. Contact information given.

## 2015-03-07 ENCOUNTER — Ambulatory Visit: Payer: Medicare (Managed Care)

## 2015-03-07 ENCOUNTER — Ambulatory Visit
Admission: RE | Admit: 2015-03-07 | Discharge: 2015-03-07 | Disposition: A | Discharge status: Home / Self Care | Payer: Medicare Other | Source: Ambulatory Visit | Attending: Radiation Oncology | Admitting: Radiation Oncology

## 2015-03-07 ENCOUNTER — Ambulatory Visit: Payer: Medicare Other | Admitting: Physical Therapy

## 2015-03-07 ENCOUNTER — Other Ambulatory Visit: Payer: Medicare (Managed Care)

## 2015-03-07 ENCOUNTER — Ambulatory Visit: Payer: Medicare (Managed Care) | Admitting: Oncology

## 2015-03-08 ENCOUNTER — Ambulatory Visit
Admission: RE | Admit: 2015-03-08 | Discharge: 2015-03-08 | Disposition: A | Discharge status: Home / Self Care | Payer: Medicare Other | Source: Ambulatory Visit | Attending: Radiology | Admitting: Radiology

## 2015-03-08 DIAGNOSIS — C50911 Malignant neoplasm of unspecified site of right female breast: Secondary | ICD-10-CM

## 2015-03-08 MED ORDER — GADOBENATE DIMEGLUMINE 529 MG/ML IV SOLN
17.0000 mL | Freq: Once | INTRAVENOUS | Status: AC | PRN
  Administered 2015-03-08: 17 mL via INTRAVENOUS

## 2015-03-12 ENCOUNTER — Other Ambulatory Visit: Payer: Self-pay | Admitting: Surgery

## 2015-03-16 ENCOUNTER — Telehealth: Payer: Self-pay | Admitting: *Deleted

## 2015-03-16 NOTE — Telephone Encounter (Signed)
Received referral from [facility].  Called and left a message for the pt to return my call so I can schedule a med onc appt.  

## 2015-03-19 ENCOUNTER — Telehealth: Payer: Self-pay | Admitting: *Deleted

## 2015-03-19 NOTE — Telephone Encounter (Signed)
Pt returned my call and I confirmed 03/20/15 appt w/ her.  Unable to mail before appt letter - gave verbal.  Unable to mail welcoming packet - gave directions and instructions.  Unable to mail intake form - placed a note for one to be given at time of check in.  Emailed Crystal at [employer] to make her aware.  Placed a copy of records in Dr. Ernestina Penna box and took one to HIM to scan.

## 2015-03-20 ENCOUNTER — Telehealth: Payer: Self-pay | Admitting: Hematology

## 2015-03-20 ENCOUNTER — Encounter: Payer: Self-pay | Admitting: Hematology

## 2015-03-20 ENCOUNTER — Ambulatory Visit: Payer: Medicare Other

## 2015-03-20 ENCOUNTER — Ambulatory Visit (HOSPITAL_BASED_OUTPATIENT_CLINIC_OR_DEPARTMENT_OTHER): Payer: Medicare Other | Admitting: Hematology

## 2015-03-20 VITALS — BP 135/67 | HR 67 | Temp 98.4°F | Resp 67 | Ht 64.0 in | Wt 187.0 lb

## 2015-03-20 DIAGNOSIS — Z170 Estrogen receptor positive status [ER+]: Secondary | ICD-10-CM | POA: Diagnosis not present

## 2015-03-20 DIAGNOSIS — C50311 Malignant neoplasm of lower-inner quadrant of right female breast: Secondary | ICD-10-CM | POA: Diagnosis not present

## 2015-03-20 DIAGNOSIS — C773 Secondary and unspecified malignant neoplasm of axilla and upper limb lymph nodes: Secondary | ICD-10-CM

## 2015-03-20 NOTE — Telephone Encounter (Signed)
Gave and printed appt sched and avs for pt for Aug °

## 2015-03-20 NOTE — Progress Notes (Addendum)
[facility]  Telephone:([phone]) [phone] Fax:([phone]) [facility] Note   Patient Care Team: Lucianne Lei, MD as PCP - General (Family Medicine) Erroll Luna, MD as Consulting Physician (General Surgery) Chauncey Cruel, MD as Consulting Physician (Oncology) Eppie Gibson, MD as Attending Physician (Radiation Oncology) Rockwell Germany, RN as Registered Nurse Mauro Kaufmann, RN as Registered Nurse 03/20/2015  CHIEF COMPLAINTS/PURPOSE OF CONSULTATION:  Newly diagnosed right breast cancer.    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 female is here because of recently you diagnosed right breast cancer.  She felt a lump in the right breast about 2 months ago, nontender, no skin or nipple change. She was seen by her primary care physician, mammogram showed a right breast mass. She underwent a core needle biopsy of the right breast mass on 02/27/2015, which revealed invasive lobular carcinoma, ER positive, PR negative, HER-2 negative. She was seen by Dr. Ninfa Linden, and was referred to [place] to discuss treatment options.  She has moderate arthritis, had a bilateral knee replacement, and recently right hip replacement in April. She has mild to moderate arthritis pain, but remains fairly active. She lives with her granddaughter, completely independent, goes out  with her sister for shopping, etc she denies any other significant pain, no dyspnea, no GI symptoms. She has good appetite and eats well. No recent weight loss.  MEDICAL HISTORY:  Past Medical History  Diagnosis Date  . Asthma   . Hypertension   . Hepatitis C     from blood transfusion  . Arthritis   . Asthma exacerbation 10/14/2011    SURGICAL HISTORY: Past Surgical History  Procedure Laterality Date  . Joint replacement      Bilateral total knee replacements  . Cholecystectomy  02/21/2002  . Right rotator cuff repair  08/12/2006  . 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 Total hip arthroplasty Right 11/22/2014    Procedure: TOTAL HIP ARTHROPLASTY ANTERIOR APPROACH;  Surgeon: Marybelle Killings, MD;  Location: [facility];  Service: Orthopedics;  Laterality: Right;    SOCIAL HISTORY: History   Social History  . Marital Status: Legally Separated    Spouse Name: N/A  . Number of Children: N/A  . Years of Education: N/A   Occupational History  . Not on file.   Social History Main Topics  . Smoking status: Never Smoker   . Smokeless tobacco: Never Used  . Alcohol Use: No  . Drug Use: No  . Sexual Activity: No   Other Topics Concern  . Not on file   Social History Narrative    FAMILY HISTORY: Family History  Problem Relation Age of Onset  . Hypertension Sister     ALLERGIES:  has No Known Allergies.  MEDICATIONS:  Current Outpatient Prescriptions  Medication  Sig Dispense Refill  . acetaminophen (TYLENOL) 650 MG CR tablet Take 650 mg by mouth every 8 (eight) hours as needed for pain (pain).    Marland Kitchen albuterol (PROVENTIL) (2.5 MG/3ML) 0.083% nebulizer solution Take 2.5 mg by nebulization every 4 (four) hours as needed for wheezing or shortness of breath (wheezing).    Marland Kitchen alendronate (FOSAMAX) 70 MG tablet Take 70 mg by mouth every 7 (seven) days. On Tuesdays. Take with a full glass of water on an empty stomach.    Marland Kitchen aspirin 81 MG tablet Take 81 mg by mouth daily.     . B Complex-C (B-COMPLEX WITH VITAMIN C) tablet Take 1 tablet by mouth daily.    . hydrochlorothiazide (HYDRODIURIL) 25 MG tablet Take 25 mg by mouth daily.    . potassium chloride SA (K-DUR,KLOR-CON) 20 MEQ tablet Take 20 mEq by mouth daily.  0  . oxyCODONE-acetaminophen (ROXICET) 5-325 MG per tablet Take 1 tablet by mouth every 6 (six) hours as needed for severe pain. (Patient not taking: Reported on 03/20/2015) 60 tablet 0   No current facility-administered medications for this visit.    REVIEW OF SYSTEMS:   Constitutional: Denies fevers, chills or abnormal night sweats Eyes: Denies blurriness of vision, double vision or watery eyes Ears, nose, mouth, throat, and face: Denies mucositis or sore throat Respiratory: Denies cough, dyspnea or wheezes Cardiovascular: Denies palpitation, chest discomfort or lower extremity swelling Gastrointestinal:  Denies nausea, heartburn or change in bowel habits Skin: Denies abnormal skin rashes Lymphatics: Denies new lymphadenopathy or easy bruising Neurological:Denies numbness, tingling or new weaknesses Behavioral/Psych: Mood is stable, no new changes  All other systems were reviewed with the patient and are negative.  PHYSICAL EXAMINATION: ECOG PERFORMANCE STATUS: 1 - Symptomatic but completely ambulatory  Filed Vitals:   03/20/15 1515  BP: 135/67  Pulse: 67  Temp: 98.4 F (36.9 C)  Resp: 67   Filed Weights   03/20/15 1515  Weight: 187 lb (84.823 kg)    GENERAL:alert, no distress and comfortable SKIN: skin color, texture, turgor are normal, no rashes or significant lesions EYES: normal, conjunctiva are pink and non-injected, sclera clear OROPHARYNX:no exudate, no erythema and lips, buccal mucosa, and tongue normal  NECK: supple, thyroid normal size, non-tender, without nodularity LYMPH:  no palpable lymphadenopathy in the cervical, axillary or inguinal LUNGS: clear to auscultation and percussion with normal breathing effort HEART:  regular rate & rhythm and no murmurs and no lower extremity edema ABDOMEN:abdomen soft, non-tender and normal bowel sounds Musculoskeletal:no cyanosis of digits and no clubbing  PSYCH: alert & oriented x 3 with fluent speech NEURO: no focal motor/sensory deficits Breasts: Breast inspection showed them to be symmetrical with no nipple discharge. There is a 2.5X3.5cm mass in the inner lower quadrant of right breast. Palpation of the breasts and axilla revealed no obvious mass that I could appreciate.  LABORATORY DATA:  I have reviewed the data as listed Lab Results  Component Value Date   WBC 12.6* 12/06/2014   HGB 8.6* 12/06/2014   HCT 27.7* 12/06/2014   MCV 97.2 12/06/2014   PLT 657* 12/06/2014    Recent Labs  11/10/14 1254  11/30/14 1913  12/04/14 0510 12/05/14 0504 12/06/14 0927  NA 140  < > 134*  < > 138 137 138  K 3.4*  < > 3.1*  < > 3.7 3.3* 3.9  CL 98  < > 96  < > 104 105 104  CO2 32  < > 29  < > 27  26 27  GLUCOSE 97  < > 116*  < > 99 100* 105*  BUN 9  < > 14  < > 7 5* 5*  CREATININE 0.76  < > 0.70  < > 0.55 0.53 0.54  CALCIUM 9.4  < > 8.2*  < > 7.7* 7.8* 8.3*  GFRNONAA 80*  < > 83*  < > 89* >90 >90  GFRAA >90  < > >90  < > >90 >90 >90  PROT 7.7  --  7.0  --   --   --   --   ALBUMIN 3.8  --  3.0*  --   --   --   --   AST 34  --  29  --   --   --   --   ALT 23  --  20  --   --   --   --   ALKPHOS 58  --  54  --   --   --   --   BILITOT 1.1  --  0.9  --   --   --   --   < > = values in this interval not displayed.  PATHOLOGY REPORT Diagnosis 02/27/2015  1. Breast, right, needle core biopsy - INVASIVE LOBULAR CARCINOMA. - LOBULAR CARCINOMA IN SITU. - SEE COMMENT. 2. Lymph node, needle/core biopsy, right axilla - ONE LYMPH NODE POSITIVE FOR METASTATIC LOBULAR CARCINOMA (1/1). - SEE COMMENT. Microscopic Comment 1. An E-cadherin immunohistochemical stain is performed on the right needle core biopsy. The stain is negative in both the in situ and invasive components  confirming the above diagnosis. Although definitive grading of breast carcinoma is best done on excision, the features of the invasive tumor from the right needle core biopsy are compatible with a grade 1-2 breast carcinoma. Breast prognostic markers will be performed and reported in an addendum. Findings are called to [facility] on 03/01/2015. Dr. Lyndon Code has seen the first specimen in consultation with agreement. 2. A cytokeratin AE1/AE3 immunohistochemical stain is performed on the second specimen (right axillary lymph node biopsy). The stain highlights scattered tumor cells confirming the above diagnosis. Dr. Lyndon Code has seen the second specimen in consultation with agreement. A breast prognostic profile will also be performed on the right axillary lymph node and reported in an addendum to follow. (RH:ecj 03/01/2015)  1. PROGNOSTIC INDICATORS - ACIS (BLOCK 1A) Results: IMMUNOHISTOCHEMICAL AND MORPHOMETRIC ANALYSIS BY THE AUTOMATED CELLULAR IMAGING SYSTEM (ACIS) Estrogen Receptor: 95%, POSITIVE, STRONG STAINING INTENSITY (PERFORMED MANUALLY) Progesterone Receptor: 0%, NEGATIVE, (PERFORMED MANUALLY) Proliferation Marker Ki67: 5% (PERFORMED MANUALLY) COMMENT: The negative hormone receptor study(ies) in this case has an internal positive control. Results: HER2 - NEGATIVE RATIO OF HER2/CEP17 SIGNALS 1.28 AVERAGE HER2 COPY NUMBER PER CELL 2.30  2. PROGNOSTIC INDICATORS - ACIS (BLOCK 2A) Results: IMMUNOHISTOCHEMICAL AND MORPHOMETRIC ANALYSIS BY THE AUTOMATED CELLULAR IMAGING SYSTEM (ACIS) Estrogen Receptor: 95%, POSITIVE, STRONG STAINING INTENSITY (PERFORMED MANUALLY) Progesterone Receptor: 0%, NEGATIVE, (PERFORMED MANUALLY) Proliferation Marker Ki67: 5% (PERFORMED MANUALLY) COMMENT: The negative hormone receptor study(ies) in this case has no internal positive control. 2. FLUORESCENCE IN-SITU HYBRIDIZATION Results: HER2 - NEGATIVE RATIO OF HER2/CEP17 SIGNALS 1.10 AVERAGE HER2 COPY  NUMBER PER CELL 2.20  RADIOGRAPHIC STUDIES: I have personally reviewed the radiological images as listed and agreed with the findings in the report.  Mr Breast Bilateral W Wo Contrast  03/08/2015    IMPRESSION: 1. Spiculated biopsy proven malignancy in the lower inner right breast measures 2.1 x 2.5 x  2.3 cm.  2. 6 mm oval mass in the slightly outer right breast at the approximate 9 o'clock position.  3.  Oval enhancing nodules (5-6 mm) in the upper-outer right breast.  4. Enlarged lymph node in the upper-outer right breast compatible with biopsy proven nodal metastases.  5. No MRI evidence of malignancy in the left breast.   RECOMMENDATION: 1. Second-look ultrasound with biopsy for the 6 mm oval mass in the slightly outer right breast at 9 o'clock.  2. Second-look ultrasound with biopsy for one of the enhancing nodules seen in the upper-outer right breast.  If these nodules cannot be seen via ultrasound, then MRI guided biopsy is recommended if considering breast conservation.  BI-RADS CATEGORY  4: Suspicious.   Electronically Signed   By: Everlean Alstrom M.D.   On: 03/08/2015 12:41    ASSESSMENT & PLAN:  74 year old African-American female, with past medical history of osteoarthritis, status post multiple orthopedic surgeries, but is still completely independent and active.  1. Right breast invasive lobular carcinoma, cT2N1M0, stage IIB, ER+/PR-/HER2-, with 2 indeterminate right breast lesions -I discussed her right breast mass and axillary node biopsy results in great detail with her. She apparently has not completely accepted the diagnosis yet, although I reviewed the path report with her and her sister in great detail  -I discussed the staging with her, she does not have clinical concerns for metastatic disease, if her lab is unremarkable, I do not feel she needs a staging CT scan and bone scan. -I reviewed her breast MRI findings with her, I gave her the option of MRI guided biopsy, or proceed  with mastectomy without biopsy. She will think about it -given her lymph node testis, I recommend neoadjuvant or adjuvant chemotherapy. She initially stated she does not want chemotherapy, but became more receptive after I reviewed the benefit and potential side effects of chemotherapy -Given her advanced age and unwillingness to go through intensive chemotherapy, I would recommend 4 cycles of docetaxel and Cytoxan if she decides to take chemotherapy -Given her strong ER/PR positivity, I recommend adjuvant endocrine therapy with tamoxifen or aromatase inhibitor. Given her osteoporosis, severe arthritis, tamoxifen may be a reasonable option. -she is scheduled to see radiation oncologist Dr. Lisbeth Renshaw next week -her sister is more receptive to the treatment recommendation, I encouraged her to discuss with her family members and make a final decision on chemo and breast biopsy by next week.  Plan -chemo class -I will see her back in one week to finalize her chemo treatment    All questions were answered. The patient knows to call the clinic with any problems, questions or concerns. I spent 55 minutes counseling the patient face to face. The total time spent in the appointment was 60 minutes and more than 50% was on counseling.     Truitt Merle, MD 03/20/2015 6:27 PM

## 2015-03-20 NOTE — Progress Notes (Signed)
Checked in new pt with no financial concerns.Pt has 2 insurances so may not need fin assistance.

## 2015-03-22 ENCOUNTER — Encounter: Payer: Self-pay | Admitting: Radiation Oncology

## 2015-03-22 NOTE — Progress Notes (Addendum)
Location of Breast Cancer: Right Breast   Histology per Pathology Report:  Diagnosis 02/27/2015:  1. Breast, right, needle core biopsy - INVASIVE LOBULAR CARCINOMA.- LOBULAR CARCINOMA IN SITU. - SEE COMMENT. 2. Lymph node, needle/core biopsy, right axilla - ONE LYMPH NODE POSITIVE FOR METASTATIC LOBULAR CARCINOMA (1/1).  Receptor Status: ER( + 95%  ), PR ( neg 0% ), Her2-neu ( neg, KI 67 5%  )  [name]. [place] stated that she noticed a lump in her breast followed by 2 mammograms  1. Second-look ultrasound with biopsy for the 6 mm oval mass in the slightly outer right breast at 9 o'clock.  2. Second-look ultrasound with biopsy for one of the enhancing nodules seen in the upper-outer right breast.  If these nodules cannot be seen via ultrasound, then MRI guided biopsy is recommended if considering breast conservation  Past/Anticipated interventions by surgeon, if DUP:[id number] Dr. Marcello Moores Cornett,.MD  Past/Anticipated interventions by medical oncology, if any: Chemotherapy Dr. Burr Medico 03/20/15, MR Breast B/L 03/08/15  Lymphedema issues, if any: None  Pain issues, if any: No   SAFETY ISSUES:  Prior radiation? NO  Pacemaker/ICD? NO  Possible current pregnancy? N/A  Is the patient on methotrexate? NO  Current Complaints / other details:  Legally Separated, never smoker,or used smokeless tobacco, no alcohol  or illicit drug use, HX Hep C from blood transfusion  Allergies: NKA    Rebecca Eaton, RN 03/22/2015,1:02 PM

## 2015-03-26 ENCOUNTER — Ambulatory Visit
Admission: RE | Admit: 2015-03-26 | Discharge: 2015-03-26 | Disposition: A | Discharge status: Home / Self Care | Payer: Medicare (Managed Care) | Source: Ambulatory Visit | Attending: Radiation Oncology | Admitting: Radiation Oncology

## 2015-03-26 ENCOUNTER — Encounter: Payer: Self-pay | Admitting: Radiation Oncology

## 2015-03-26 VITALS — BP 135/64 | HR 86 | Temp 98.2°F | Ht 64.0 in | Wt 187.2 lb

## 2015-03-26 DIAGNOSIS — C50311 Malignant neoplasm of lower-inner quadrant of right female breast: Secondary | ICD-10-CM

## 2015-03-26 DIAGNOSIS — Z510 Encounter for antineoplastic radiation therapy: Secondary | ICD-10-CM | POA: Diagnosis present

## 2015-03-26 HISTORY — DX: Pneumonia, unspecified organism: J18.9

## 2015-03-26 HISTORY — DX: Encounter for other specified aftercare: Z51.89

## 2015-03-26 NOTE — Progress Notes (Signed)
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 A diagnosis of Breast cancer of lower-inner quadrant of right female breast was also pertinent to this visit.  Breast cancer of lower-inner quadrant of right female breast   Staging form: Breast, AJCC 7th Edition     Clinical stage from 03/20/2015: Stage IIB (T2, N1, M0) - Unsigned   HISTORY OF PRESENT ILLNESS::Peggy Welch is a 74 y.o. female who is seen for an initial consultation visit regarding the patient's diagnosis of breast cancer. The patient was found to have suspicious findings within the right breast on initial mammogram. The patient has had symptoms prior to this study: felt a lump about two months ago. A diagnostic mammogram and breast ultrasound confirmed this finding. Second- look ultrasound with biopsy for the 6 mm oval mass in the slightly outer right breast at 9 o'clock and enhancing nodules seen in the upper-outer right breast was performed.   A biopsy was performed. This revealed invasive lobular carcinoma. One right axilla lymph node was also positive. Receptors studies were completed and indicate that the tumor is estrogen receptor positive, progesterone receptor negative, and Her-2/neu negative. The Ki-67 staining was 5%.  The patient has undergone an MRI scan of the breasts: Confirmed mammogram findings. Malignancy measuring 2.5 cm in lower inner right breast, 6 mm oval mass in outer right breast, oval enhancing nodules 5 mm in upper outer right breast, enlarged lymph node in the upper-outer right breast consistent with biopsy proven nodal metastases.   Patient has had surgery on the right breast when she was  about 74 years old to remove a benign cyst. Denies any pain, discharge or bleeding of the breasts. Patient states she is otherwise healthy.    PREVIOUS RADIATION THERAPY: No  PAST MEDICAL HISTORY:  has a past medical history of Asthma; Hypertension; Hepatitis C; Arthritis; Asthma exacerbation (10/14/2011); Blood transfusion without reported diagnosis; and Pneumonia (4/02016).     PAST SURGICAL HISTORY: Past Surgical History  Procedure Laterality Date  . Joint replacement      Bilateral total knee replacements  . Cholecystectomy  02/21/2002  . Right rotator cuff repair  08/12/2006  . 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 Total hip arthroplasty Right 11/22/2014    Procedure: TOTAL HIP ARTHROPLASTY ANTERIOR APPROACH;  Surgeon: Marybelle Killings, MD;  Location: [facility];  Service: Orthopedics;  Laterality: Right;  . Knee replacements Bilateral      FAMILY HISTORY: family history includes Hypertension in her sister.   SOCIAL HISTORY:  reports that she has never smoked. She has never used smokeless tobacco. She reports that she does not drink alcohol or use illicit drugs.   ALLERGIES: Shrimp   MEDICATIONS:  Current Outpatient Prescriptions  Medication Sig Dispense Refill  . acetaminophen (TYLENOL) 650 MG CR tablet Take 650 mg by mouth every 8 (eight) hours as needed for pain (pain).    Marland Kitchen albuterol (PROVENTIL) (2.5 MG/3ML) 0.083% nebulizer solution Take 2.5 mg by nebulization every 4 (four) hours as needed for wheezing or shortness of breath (wheezing).    Marland Kitchen alendronate (FOSAMAX) 70 MG tablet Take 70 mg by mouth every 7 (seven) days. On  Tuesdays. Take with a full glass of water on an empty stomach.    Marland Kitchen aspirin 81 MG tablet Take 81 mg by mouth daily.    . B Complex-C (B-COMPLEX WITH VITAMIN C) tablet Take 1 tablet by mouth daily.    . hydrochlorothiazide (HYDRODIURIL) 25 MG tablet Take 25 mg by mouth daily.    . potassium chloride SA (K-DUR,KLOR-CON) 20 MEQ tablet Take 20 mEq by  mouth daily.  0  . oxyCODONE-acetaminophen (ROXICET) 5-325 MG per tablet Take 1 tablet by mouth every 6 (six) hours as needed for severe pain. (Patient not taking: Reported on 03/20/2015) 60 tablet 0   No current facility-administered medications for this encounter.     REVIEW OF SYSTEMS:  A 15 point review of systems is documented in the electronic medical record. This was obtained by the nursing staff. However, I reviewed this with the patient to discuss relevant findings and make appropriate changes.  Pertinent items are noted in HPI.    PHYSICAL EXAM:  height is 5' 4" (1.626 m) and weight is 187 lb 3.2 oz (84.913 kg). Her temperature is 98.2 F (36.8 C). Her blood pressure is 135/64 and her pulse is 86.     ECOG = 0  0 - Asymptomatic (Fully active, able to carry on all predisease activities without restriction)  1 - Symptomatic but completely ambulatory (Restricted in physically strenuous activity but ambulatory and able to carry out work of a light or sedentary nature. For example, light housework, office work)  2 - Symptomatic, <50% in bed during the day (Ambulatory and capable of all self care but unable to carry out any work activities. Up and about more than 50% of waking hours)  3 - Symptomatic, >50% in bed, but not bedbound (Capable of only limited self-care, confined to bed or chair 50% or more of waking hours)  4 - Bedbound (Completely disabled. Cannot carry on any self-care. Totally confined to bed or chair)  5 - Death   Eustace Pen MM, Creech RH, Tormey DC, et al. [phone]). "Toxicity and response criteria of the [facility]". [place] Oncol. 5 (6): 649-55  General: Well-developed, in no acute distress HEENT: Normocephalic, atraumatic; oral cavity clear Neck: Supple without any lymphadenopathy Cardiovascular: Regular rate and rhythm Respiratory: Clear to auscultation bilaterally Breasts: Palpable mass in right breast medial to nipple, I can not feel  any additional masses or enlarged lymph nodes. Otherwise, breasts appear normal, no skin or nipple changes or axillary nodes. GI: Soft, nontender, normal bowel sounds Extremities: No edema present Neuro: No focal deficits   LABORATORY DATA:  Lab Results  Component Value Date   WBC 6.6 03/27/2015   HGB 12.2 03/27/2015   HCT 37.1 03/27/2015   MCV 91.4 03/27/2015   PLT 305 03/27/2015   Lab Results  Component Value Date   NA 141 03/27/2015   K 4.0 03/27/2015   CL 104 12/06/2014   CO2 30* 03/27/2015   Lab Results  Component Value Date   ALT 18 03/27/2015   AST 26 03/27/2015   ALKPHOS 59 03/27/2015   BILITOT 0.91 03/27/2015      RADIOGRAPHY: Mr Breast Bilateral W Wo Contrast  03/08/2015   CLINICAL DATA:  74 year old female with recently diagnosed invasive lobular carcinoma of the right breast post ultrasound-guided biopsy of a mass in the lower inner right. A lymph node biopsied in the right axilla was positive for metastatic lobular carcinoma.  LABS:  BUN and creatinine were obtained  on site at [facility] at  [address]  Results:  BUN 3 mg/dL,  Creatinine 0.7 mg/dL.  EXAM: BILATERAL BREAST MRI WITH AND WITHOUT CONTRAST  TECHNIQUE: Multiplanar, multisequence MR images of both breasts were obtained prior to and following the intravenous administration of 17 ml of MultiHance.  THREE-DIMENSIONAL MR IMAGE RENDERING ON INDEPENDENT WORKSTATION:  Three-dimensional MR images were rendered by post-processing of the original MR data on an independent workstation. The three-dimensional MR images were interpreted, and findings are reported in the following complete MRI report for this study. Three dimensional images were evaluated at the independent DynaCad workstation  COMPARISON:  Previous exam(s).  FINDINGS: Breast composition: c.  Heterogeneous fibroglandular tissue.  Background parenchymal enhancement: Marked.  Right breast: Spiculated enhancing mass containing biopsy clip  artifact in the lower inner right breast compatible with biopsy proven malignancy measures 2.1 cm AP, 2.5 cm transverse, and 2.3 cm craniocaudal. There is an oval mass in the slightly outer right breast at the approximate 9 o'clock position measuring 6 mm. This may correspond with the probably benign mass recently seen at sonography. In addition, there are small approximately 5-6 mm oval enhancing masses in the upper-outer right breast.  Left breast: No suspiciously rapidly enhancing masses or abnormal areas of enhancement in the left breast to suggest malignancy.  Lymph nodes: An enlarged lymph node in the upper-outer right breast with an adjacent biopsy marking clip compatible with biopsy proven nodal metastases measures 1.5 x 1.0 cm. No additional abnormal lymph nodes seen in the right axilla. No morphologically abnormal left axillary lymph nodes. No internal mammary lymphadenopathy.  Ancillary findings:  None.  IMPRESSION: 1. Spiculated biopsy proven malignancy in the lower inner right breast measures 2.1 x 2.5 x 2.3 cm.  2. 6 mm oval mass in the slightly outer right breast at the approximate 9 o'clock position.  3.  Oval enhancing nodules (5-6 mm) in the upper-outer right breast.  4. Enlarged lymph node in the upper-outer right breast compatible with biopsy proven nodal metastases.  5. No MRI evidence of malignancy in the left breast.  RECOMMENDATION: 1. Second-look ultrasound with biopsy for the 6 mm oval mass in the slightly outer right breast at 9 o'clock.  2. Second-look ultrasound with biopsy for one of the enhancing nodules seen in the upper-outer right breast.  If these nodules cannot be seen via ultrasound, then MRI guided biopsy is recommended if considering breast conservation.  BI-RADS CATEGORY  4: Suspicious.   Electronically Signed   By: Everlean Alstrom M.D.   On: 03/08/2015 12:41      IMPRESSION:    Breast cancer of lower-inner quadrant of right female breast   02/27/2015 Receptors her2 ER  95%+, PR-, HER2 (-), KI67 5%    02/27/2015 Initial Biopsy right breast mass and axillary node biopsy showed invasive lobular carcinoma, (+) LCIS    03/05/2015 Initial Diagnosis Breast cancer of lower-inner quadrant of right female breast   03/08/2015 Imaging Breast MRI showed:  Spiculated biopsy proven malignancy in the lower inner R breast  2.1 x 2.5 x 2.3 cm,  6 mm oval mass in the R breast 9 o'clock position, Oval ehancing nodules (5-6 mm) in the upper-outer R breast, and known enlarged LN, left breast (-)    The patient has a recent diagnosis of stage IIB invasive lobular carcinoma of the right breast. She appears to be a good candidate for breast conservation treatment.  I discussed with the patient the role of adjuvant  radiation treatment in this setting. We discussed the potential benefit of radiation treatment, especially with regards to local control of the patient's tumor. We also discussed the possible side effects and risks of such a treatment as well.  We discussed in detail the natural history of this disease which would result in local growth as well as eventual progression to other sites to a greater extent.  All of the patient's questions were answered. Considering her age, the patient has voiced opposition to receiving surgery or radiation therapy at this time.     PLAN: I look forward to following up with patient's decision whether or not to proceed with surgery/ chemotherapy. The patient today indicated a strong interest in not proceeding with any surgery or radiation treatment. Also does not want chemotherapy. I did discuss with her at least the possibility of proceeding with anti-hormonal treatments. She does not appear to be very interested in this as well although she will discuss this further with medical oncology. The patient knows to contact our office if she changes her mind. We discussed that radiation treatment would not make sense without her proceeding with other treatment  as well. She expressed an understanding of this. If she changes her mind, I would be happy to see her in the future.   ________________________________   Jodelle Gross, MD, PhD   **Disclaimer: This note was dictated with voice recognition software. Similar sounding words can inadvertently be transcribed and this note may contain transcription errors which may not have been corrected upon publication of note.**  This document serves as a record of services personally performed by Kyung Rudd, MD. It was created on his behalf by Derek Mound, a trained medical scribe. The creation of this record is based on the scribe's personal observations and the provider's statements to them. This document has been checked and approved by the attending provider.

## 2015-03-27 ENCOUNTER — Other Ambulatory Visit: Payer: Medicare (Managed Care)

## 2015-03-27 ENCOUNTER — Ambulatory Visit: Payer: Medicare (Managed Care) | Admitting: Hematology

## 2015-03-27 ENCOUNTER — Ambulatory Visit (HOSPITAL_BASED_OUTPATIENT_CLINIC_OR_DEPARTMENT_OTHER): Payer: Medicare Other | Admitting: Hematology

## 2015-03-27 ENCOUNTER — Encounter: Payer: Self-pay | Admitting: Hematology

## 2015-03-27 ENCOUNTER — Other Ambulatory Visit (HOSPITAL_BASED_OUTPATIENT_CLINIC_OR_DEPARTMENT_OTHER): Payer: Medicare (Managed Care)

## 2015-03-27 ENCOUNTER — Encounter: Payer: Self-pay | Admitting: Radiation Oncology

## 2015-03-27 ENCOUNTER — Telehealth: Payer: Self-pay | Admitting: Hematology

## 2015-03-27 VITALS — BP 133/65 | HR 72 | Temp 98.6°F | Resp 18 | Ht 64.0 in | Wt 186.5 lb

## 2015-03-27 DIAGNOSIS — C50311 Malignant neoplasm of lower-inner quadrant of right female breast: Secondary | ICD-10-CM

## 2015-03-27 LAB — CBC WITH DIFFERENTIAL/PLATELET
BASO%: 0.8 % (ref 0.0–2.0)
BASOS ABS: 0.1 10*3/uL (ref 0.0–0.1)
EOS%: 3.6 % (ref 0.0–7.0)
Eosinophils Absolute: 0.2 10*3/uL (ref 0.0–0.5)
HCT: 37.1 % (ref 34.8–46.6)
HEMOGLOBIN: 12.2 g/dL (ref 11.6–15.9)
LYMPH#: 2.7 10*3/uL (ref 0.9–3.3)
LYMPH%: 41 % (ref 14.0–49.7)
MCH: 30.1 pg (ref 25.1–34.0)
MCHC: 33 g/dL (ref 31.5–36.0)
MCV: 91.4 fL (ref 79.5–101.0)
MONO#: 0.6 10*3/uL (ref 0.1–0.9)
MONO%: 8.5 % (ref 0.0–14.0)
NEUT#: 3.1 10*3/uL (ref 1.5–6.5)
NEUT%: 46.1 % (ref 38.4–76.8)
PLATELETS: 305 10*3/uL (ref 145–400)
RBC: 4.06 10*6/uL (ref 3.70–5.45)
RDW: 14 % (ref 11.2–14.5)
WBC: 6.6 10*3/uL (ref 3.9–10.3)

## 2015-03-27 LAB — COMPREHENSIVE METABOLIC PANEL (CC13)
ALT: 18 U/L (ref 0–55)
ANION GAP: 8 meq/L (ref 3–11)
AST: 26 U/L (ref 5–34)
Albumin: 3.7 g/dL (ref 3.5–5.0)
Alkaline Phosphatase: 59 U/L (ref 40–150)
BUN: 14 mg/dL (ref 7.0–26.0)
CHLORIDE: 103 meq/L (ref 98–109)
CO2: 30 mEq/L — ABNORMAL HIGH (ref 22–29)
Calcium: 9.5 mg/dL (ref 8.4–10.4)
Creatinine: 0.7 mg/dL (ref 0.6–1.1)
EGFR: >90 mL/min/{1.73_m2} (ref >90)
GLUCOSE: 88 mg/dL (ref 70–140)
Potassium: 4 mEq/L (ref 3.5–5.1)
Sodium: 141 mEq/L (ref 136–145)
Total Bilirubin: 0.91 mg/dL (ref 0.20–1.20)
Total Protein: 7.2 g/dL (ref 6.4–8.3)

## 2015-03-27 NOTE — Progress Notes (Signed)
[facility]  [phone] [phone] [place] Note   Patient Care Team: Lucianne Lei, MD as PCP - General (Family Medicine) Rockwell Germany, RN as Registered Nurse Mauro Kaufmann, RN as Registered Nurse Kyung Rudd, MD as Consulting Physician (Radiation Oncology) Coralie Keens, MD as Consulting Physician (General Surgery) 03/27/2015  CHIEF COMPLAINTS/PURPOSE OF CONSULTATION:  Follow up right breast cancer.    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 female is here because of recently you diagnosed right breast cancer.  She felt a lump in the right breast about 2 months ago, nontender, no skin or nipple change. She was seen by her primary care physician, mammogram showed a right breast mass. She underwent a core needle biopsy of the right breast mass on 02/27/2015, which revealed invasive lobular carcinoma, ER positive, PR negative, HER-2 negative. She was seen by Dr. Ninfa Linden, and was referred to [place] to discuss treatment options.  She has moderate arthritis, had a bilateral knee replacement, and recently right hip replacement in April. She has mild to moderate arthritis pain, but remains fairly active. She lives with her granddaughter, completely independent, goes out with her sister for shopping, etc she denies any other  significant pain, no dyspnea, no GI symptoms. She has good appetite and eats well. No recent weight loss.  INTERIM HISTORY Peggy Welch returns to the clinic for follow-up. She is accompanied by her sister again. She has been doing well, no new complaints. She did not come in for the chemotherapy class, did see Dr. Lisbeth Renshaw at radiation oncology Department, and is decided not to take any treatment for her breast cancer.  MEDICAL HISTORY:  Past Medical History  Diagnosis Date  . Asthma   . Hypertension   . Hepatitis C     from blood transfusion  . Arthritis   . Asthma exacerbation 10/14/2011  . Blood transfusion without reported diagnosis   . Pneumonia 4/02016    SURGICAL HISTORY: Past Surgical History  Procedure Laterality Date  . Joint replacement      Bilateral total knee replacements  . Cholecystectomy  02/21/2002  . Right rotator cuff repair  08/12/2006  . 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 Total hip arthroplasty Right 11/22/2014    Procedure: TOTAL HIP ARTHROPLASTY ANTERIOR APPROACH;  Surgeon: Marybelle Killings, MD;  Location: [facility];  Service: Orthopedics;  Laterality: Right;  . Knee replacements Bilateral     SOCIAL HISTORY: History   Social History  . Marital Status: Legally Separated    Spouse Name: N/A  . Number of Children: N/A  . Years of Education: N/A   Occupational History  . Not on file.   Social History Main Topics  . Smoking status: Never Smoker   . Smokeless tobacco: Never Used  .  Alcohol Use: No  . Drug Use: No  . Sexual Activity: No   Other Topics Concern  . Not on file   Social History Narrative    FAMILY HISTORY: Family History  Problem Relation Age of Onset  . Hypertension Sister     ALLERGIES:  is allergic to shrimp.  MEDICATIONS:  Current Outpatient Prescriptions  Medication Sig Dispense Refill  . acetaminophen (TYLENOL) 650 MG CR tablet Take 650 mg by mouth every 8 (eight) hours as needed for pain (pain).    Marland Kitchen albuterol  (PROVENTIL) (2.5 MG/3ML) 0.083% nebulizer solution Take 2.5 mg by nebulization every 4 (four) hours as needed for wheezing or shortness of breath (wheezing).    Marland Kitchen alendronate (FOSAMAX) 70 MG tablet Take 70 mg by mouth every 7 (seven) days. On Tuesdays. Take with a full glass of water on an empty stomach.    Marland Kitchen aspirin 81 MG tablet Take 81 mg by mouth daily.    . B Complex-C (B-COMPLEX WITH VITAMIN C) tablet Take 1 tablet by mouth daily.    . hydrochlorothiazide (HYDRODIURIL) 25 MG tablet Take 25 mg by mouth daily.    . potassium chloride SA (K-DUR,KLOR-CON) 20 MEQ tablet Take 20 mEq by mouth daily.  0  . oxyCODONE-acetaminophen (ROXICET) 5-325 MG per tablet Take 1 tablet by mouth every 6 (six) hours as needed for severe pain. (Patient not taking: Reported on 03/20/2015) 60 tablet 0   No current facility-administered medications for this visit.    REVIEW OF SYSTEMS:   Constitutional: Denies fevers, chills or abnormal night sweats Eyes: Denies blurriness of vision, double vision or watery eyes Ears, nose, mouth, throat, and face: Denies mucositis or sore throat Respiratory: Denies cough, dyspnea or wheezes Cardiovascular: Denies palpitation, chest discomfort or lower extremity swelling Gastrointestinal:  Denies nausea, heartburn or change in bowel habits Skin: Denies abnormal skin rashes Lymphatics: Denies new lymphadenopathy or easy bruising Neurological:Denies numbness, tingling or new weaknesses Behavioral/Psych: Mood is stable, no new changes  All other systems were reviewed with the patient and are negative.  PHYSICAL EXAMINATION: ECOG PERFORMANCE STATUS: 1 - Symptomatic but completely ambulatory  Filed Vitals:   03/27/15 1209  BP: 133/65  Pulse: 72  Temp: 98.6 F (37 C)  Resp: 18   Filed Weights   03/27/15 1209  Weight: 186 lb 8 oz (84.596 kg)    GENERAL:alert, no distress and comfortable SKIN: skin color, texture, turgor are normal, no rashes or significant lesions EYES:  normal, conjunctiva are pink and non-injected, sclera clear OROPHARYNX:no exudate, no erythema and lips, buccal mucosa, and tongue normal  NECK: supple, thyroid normal size, non-tender, without nodularity LYMPH:  no palpable lymphadenopathy in the cervical, axillary or inguinal LUNGS: clear to auscultation and percussion with normal breathing effort HEART: regular rate & rhythm and no murmurs and no lower extremity edema ABDOMEN:abdomen soft, non-tender and normal bowel sounds Musculoskeletal:no cyanosis of digits and no clubbing  PSYCH: alert & oriented x 3 with fluent speech NEURO: no focal motor/sensory deficits Breasts: Breast inspection showed them to be symmetrical with no nipple discharge. There is a 2.5X3.5cm mass in the inner lower quadrant of right breast. Palpation of the breasts and axilla revealed no obvious mass that I could appreciate.   LABORATORY DATA:  I have reviewed the data as listed Lab Results  Component Value Date   WBC 6.6 03/27/2015   HGB 12.2 03/27/2015   HCT 37.1 03/27/2015   MCV 91.4 03/27/2015   PLT 305 03/27/2015  Recent Labs  11/10/14 1254  11/30/14 1913  12/04/14 0510 12/05/14 0504 12/06/14 0927 03/27/15 1145  NA 140  < > 134*  < > 138 137 138 141  K 3.4*  < > 3.1*  < > 3.7 3.3* 3.9 4.0  CL 98  < > 96  < > 104 105 104  --   CO2 32  < > 29  < > _0 30*  GLUCOSE 97  < > 116*  < > 99 100* 105* 88  BUN 9  < > 14  < > 7 5* 5* 14.0  CREATININE 0.76  < > 0.70  < > 0.55 0.53 0.54 0.7  CALCIUM 9.4  < > 8.2*  < > 7.7* 7.8* 8.3* 9.5  GFRNONAA 80*  < > 83*  < > 89* >90 >90  --   GFRAA >90  < > >90  < > >90 >90 >90  --   PROT 7.7  --  7.0  --   --   --   --  7.2  ALBUMIN 3.8  --  3.0*  --   --   --   --  3.7  AST 34  --  29  --   --   --   --  26  ALT 23  --  20  --   --   --   --  18  ALKPHOS 58  --  54  --   --   --   --  59  BILITOT 1.1  --  0.9  --   --   --   --  0.91  < > = values in this interval not displayed.  PATHOLOGY  REPORT Diagnosis 02/27/2015  1. Breast, right, needle core biopsy - INVASIVE LOBULAR CARCINOMA. - LOBULAR CARCINOMA IN SITU. - SEE COMMENT. 2. Lymph node, needle/core biopsy, right axilla - ONE LYMPH NODE POSITIVE FOR METASTATIC LOBULAR CARCINOMA (1/1). - SEE COMMENT. Microscopic Comment 1. An E-cadherin immunohistochemical stain is performed on the right needle core biopsy. The stain is negative in both the in situ and invasive components confirming the above diagnosis. Although definitive grading of breast carcinoma is best done on excision, the features of the invasive tumor from the right needle core biopsy are compatible with a grade 1-2 breast carcinoma. Breast prognostic markers will be performed and reported in an addendum. Findings are called to [facility] on 03/01/2015. Dr. Lyndon Code has seen the first specimen in consultation with agreement. 2. A cytokeratin AE1/AE3 immunohistochemical stain is performed on the second specimen (right axillary lymph node biopsy). The stain highlights scattered tumor cells confirming the above diagnosis. Dr. Lyndon Code has seen the second specimen in consultation with agreement. A breast prognostic profile will also be performed on the right axillary lymph node and reported in an addendum to follow. (RH:ecj 03/01/2015)  1. PROGNOSTIC INDICATORS - ACIS (BLOCK 1A) Results: IMMUNOHISTOCHEMICAL AND MORPHOMETRIC ANALYSIS BY THE AUTOMATED CELLULAR IMAGING SYSTEM (ACIS) Estrogen Receptor: 95%, POSITIVE, STRONG STAINING INTENSITY (PERFORMED MANUALLY) Progesterone Receptor: 0%, NEGATIVE, (PERFORMED MANUALLY) Proliferation Marker Ki67: 5% (PERFORMED MANUALLY) COMMENT: The negative hormone receptor study(ies) in this case has an internal positive control. Results: HER2 - NEGATIVE RATIO OF HER2/CEP17 SIGNALS 1.28 AVERAGE HER2 COPY NUMBER PER CELL 2.30  2. PROGNOSTIC INDICATORS - ACIS (BLOCK 2A) Results: IMMUNOHISTOCHEMICAL AND MORPHOMETRIC ANALYSIS BY  THE AUTOMATED CELLULAR IMAGING SYSTEM (ACIS) Estrogen Receptor: 95%, POSITIVE, STRONG STAINING INTENSITY (PERFORMED MANUALLY) Progesterone Receptor: 0%, NEGATIVE, (PERFORMED MANUALLY)  Proliferation Marker Ki67: 5% (PERFORMED MANUALLY) COMMENT: The negative hormone receptor study(ies) in this case has no internal positive control. 2. FLUORESCENCE IN-SITU HYBRIDIZATION Results: HER2 - NEGATIVE RATIO OF HER2/CEP17 SIGNALS 1.10 AVERAGE HER2 COPY NUMBER PER CELL 2.20  RADIOGRAPHIC STUDIES: I have personally reviewed the radiological images as listed and agreed with the findings in the report.  Mr Breast Bilateral W Wo Contrast  03/08/2015    IMPRESSION: 1. Spiculated biopsy proven malignancy in the lower inner right breast measures 2.1 x 2.5 x 2.3 cm.  2. 6 mm oval mass in the slightly outer right breast at the approximate 9 o'clock position.  3.  Oval enhancing nodules (5-6 mm) in the upper-outer right breast.  4. Enlarged lymph node in the upper-outer right breast compatible with biopsy proven nodal metastases.  5. No MRI evidence of malignancy in the left breast.   RECOMMENDATION: 1. Second-look ultrasound with biopsy for the 6 mm oval mass in the slightly outer right breast at 9 o'clock.  2. Second-look ultrasound with biopsy for one of the enhancing nodules seen in the upper-outer right breast.  If these nodules cannot be seen via ultrasound, then MRI guided biopsy is recommended if considering breast conservation.  BI-RADS CATEGORY  4: Suspicious.   Electronically Signed   By: Everlean Alstrom M.D.   On: 03/08/2015 12:41    ASSESSMENT & PLAN:  74 year old African-American female, with past medical history of osteoarthritis, status post multiple orthopedic surgeries, but is still completely independent and active.  1. Right breast invasive lobular carcinoma, cT2N1M0, stage IIB, ER+/PR-/HER2-, with 2 indeterminate right breast lesions -I again reviewed the natural history of her breast cancer,  and the standard treatment option with surgery, which is teh only way to cure the cancer. The consequence of a untreated breast cancer, which is likely going to metastasize and becomes incurable in the future, was discussed with her and her sister in great details. She voiced good understanding -She clearly state she does not want have surgery or any treatment for her breast cancer, she believes that God will protect her -I also discussed the option of aromatase inhibitor for cancer control and prolong her life if she does not want surgery.  She agrees to think about it, she does not appear to be interested. the written material of letrozole was given to her. She knows to call me if she decides to try. -She is agreeable for follow-up, I'll see her back in 3 months.  Plan -Return to clinic in 3 months with physical exam only -She will call me if she decides to try letrozole. She has declined surgery and therapy for breast cancer.   All questions were answered. The patient knows to call the clinic with any problems, questions or concerns. I spent 25 minutes counseling the patient face to face. The total time spent in the appointment was 30 minutes and more than 50% was on counseling.     Truitt Merle, MD 03/27/2015 12:56 PM

## 2015-03-27 NOTE — Telephone Encounter (Signed)
Gave and printed appt sched and avs fo rpt; for NOV  °

## 2015-04-06 NOTE — Addendum Note (Signed)
Encounter addended by: Doreen Beam, RN on: 04/06/2015 12:13 PM<BR>     Documentation filed: Charges VN

## 2015-06-26 ENCOUNTER — Encounter: Payer: Self-pay | Admitting: Hematology

## 2015-06-26 ENCOUNTER — Other Ambulatory Visit: Payer: Self-pay | Admitting: *Deleted

## 2015-06-26 ENCOUNTER — Encounter: Payer: Medicare (Managed Care) | Admitting: Hematology

## 2015-06-26 NOTE — Progress Notes (Signed)
No show  This encounter was created in error - please disregard.

## 2015-06-27 ENCOUNTER — Telehealth: Payer: Self-pay | Admitting: Hematology

## 2015-06-27 NOTE — Telephone Encounter (Signed)
cld pt per pf to r/s appt-spoke w/pt and pt stated not wanting to r/s @ this time

## 2016-07-09 ENCOUNTER — Emergency Department (HOSPITAL_COMMUNITY): Payer: Medicare (Managed Care)

## 2016-07-09 ENCOUNTER — Encounter (HOSPITAL_COMMUNITY): Payer: Self-pay | Admitting: Emergency Medicine

## 2016-07-09 ENCOUNTER — Emergency Department (HOSPITAL_COMMUNITY)
Admission: EM | Admit: 2016-07-09 | Discharge: 2016-07-09 | Disposition: A | Discharge status: Home / Self Care | Payer: Medicare (Managed Care) | Attending: Emergency Medicine | Admitting: Emergency Medicine

## 2016-07-09 DIAGNOSIS — Z96653 Presence of artificial knee joint, bilateral: Secondary | ICD-10-CM | POA: Diagnosis not present

## 2016-07-09 DIAGNOSIS — I10 Essential (primary) hypertension: Secondary | ICD-10-CM | POA: Diagnosis not present

## 2016-07-09 DIAGNOSIS — M7061 Trochanteric bursitis, right hip: Secondary | ICD-10-CM | POA: Diagnosis not present

## 2016-07-09 DIAGNOSIS — Z853 Personal history of malignant neoplasm of breast: Secondary | ICD-10-CM | POA: Insufficient documentation

## 2016-07-09 DIAGNOSIS — M25551 Pain in right hip: Secondary | ICD-10-CM | POA: Diagnosis present

## 2016-07-09 DIAGNOSIS — Z96641 Presence of right artificial hip joint: Secondary | ICD-10-CM | POA: Diagnosis not present

## 2016-07-09 DIAGNOSIS — Y9389 Activity, other specified: Secondary | ICD-10-CM | POA: Insufficient documentation

## 2016-07-09 DIAGNOSIS — Z79899 Other long term (current) drug therapy: Secondary | ICD-10-CM | POA: Insufficient documentation

## 2016-07-09 DIAGNOSIS — J45909 Unspecified asthma, uncomplicated: Secondary | ICD-10-CM | POA: Insufficient documentation

## 2016-07-09 DIAGNOSIS — Z7982 Long term (current) use of aspirin: Secondary | ICD-10-CM | POA: Insufficient documentation

## 2016-07-09 MED ORDER — NAPROXEN 250 MG PO TABS: 250.0000 mg | ORAL_TABLET | Freq: Two times a day (BID) | ORAL | 0 refills | Status: DC

## 2016-07-09 MED ORDER — FENTANYL CITRATE (PF) 100 MCG/2ML IJ SOLN
50.0000 ug | Freq: Once | INTRAMUSCULAR | Status: AC
  Administered 2016-07-09: 50 ug via INTRAMUSCULAR
  Filled 2016-07-09: qty 2

## 2016-07-09 NOTE — Discharge Instructions (Signed)
Use heat over the painful area. Take the medication as prescribed. Follow up with Dr Lorin Mercy about your hip pain. Use your walker to support your hip.

## 2016-07-09 NOTE — ED Notes (Signed)
Granddaughter Peggy Welch [phone]

## 2016-07-09 NOTE — ED Notes (Signed)
Pt ambulated in room with walker.  

## 2016-07-09 NOTE — ED Provider Notes (Signed)
[facility] DEPT Provider Note   CSN: [id number] Arrival date & time: 07/09/16  0205 By signing my name below, I, Peggy Welch, attest that this documentation has been prepared under the direction and in the presence of Peggy Porter, MD. Electronically Signed: Doran Welch, ED Scribe. 07/09/16. 3:13 AM.  Time seen 03:13 AM  History   Chief Complaint Chief Complaint  Patient presents with  . Hip Pain   The history is provided by the patient. No language interpreter was used.   HPI Comments: Peggy Welch is a 75 y.o. female who presents to the Emergency Department via EMS with a PMHx of HTN complaining of sudden right hip pain, prior to arrival. Pt reports she was on the toilet reaching for the toilet paper when she could not move due to sudden pain in her right hip. Pt had right hip replacement 10/2014. Pt denies any falls or known injuries. She did not fall. She was able to ambulate with help of her son afterwards. Pt denies any LOC, neck pain, back pain, numbness, weakness, or any other symptoms at this time.   PCP Dr Peggy Welch Dr Peggy Welch  Past Medical History:  Diagnosis Date  . Arthritis   . Asthma   . Asthma exacerbation 10/14/2011  . Blood transfusion without reported diagnosis   . Hepatitis C    from blood transfusion  . Hypertension   . Pneumonia 4/02016   Patient Active Problem List   Diagnosis Date Noted  . Breast cancer of lower-inner quadrant of right female breast ([place]) 03/05/2015  . Postoperative anemia due to acute blood loss 12/01/2014  . Cough 12/01/2014  . HCAP (healthcare-associated pneumonia) 11/30/2014  . Right lower lobe pneumonia ([place]) 11/30/2014  . Sepsis ([place]) 11/30/2014  . 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 Hypokalemia 10/14/2011  . HTN (hypertension) 10/14/2011   Past Surgical History:  Procedure Laterality Date  . CHOLECYSTECTOMY  02/21/2002  . JOINT REPLACEMENT     Bilateral total knee replacements  . knee replacements  Bilateral   . Right Rotator Cuff Repair  08/12/2006  . TOTAL HIP ARTHROPLASTY Right 11/22/2014   dr Peggy Welch  . TOTAL HIP ARTHROPLASTY Right 11/22/2014   Procedure: TOTAL HIP ARTHROPLASTY ANTERIOR APPROACH;  Surgeon: Peggy Killings, MD;  Location: [facility];  Service: Orthopedics;  Laterality: Right;   OB History    No data available     Home Medications    Prior to Admission medications   Medication Sig Start Date End Date Taking? Authorizing Provider  acetaminophen (TYLENOL) 650 MG CR tablet Take 650 mg by mouth every 8 (eight) hours as needed for pain (pain).    Historical Provider, MD  albuterol (PROVENTIL) (2.5 MG/3ML) 0.083% nebulizer solution Take 2.5 mg by nebulization every 4 (four) hours as needed for wheezing or shortness of breath (wheezing).    Historical Provider, MD  alendronate (FOSAMAX) 70 MG tablet Take 70 mg by mouth every 7 (seven) days. On Tuesdays. Take with a full glass of water on an empty stomach.    Historical Provider, MD  aspirin 81 MG tablet Take 81 mg by mouth daily.    Historical Provider, MD  B Complex-C (B-COMPLEX WITH VITAMIN C) tablet Take 1 tablet by mouth daily.    Historical Provider, MD  hydrochlorothiazide (HYDRODIURIL) 25 MG tablet Take 25 mg by mouth daily.    Historical Provider, MD  naproxen (NAPROSYN) 250 MG tablet Take 1 tablet (250 mg total) by mouth 2 (two) times daily.  07/09/16   Peggy Porter, MD  oxyCODONE-acetaminophen (ROXICET) 5-325 MG per tablet Take 1 tablet by mouth every 6 (six) hours as needed for severe pain. Patient not taking: Reported on 03/20/2015 11/24/14   Lanae Crumbly, PA-C  potassium chloride SA (K-DUR,KLOR-CON) 20 MEQ tablet Take 20 mEq by mouth daily. 10/25/14   Historical Provider, MD   Family History Family History  Problem Relation Age of Onset  . Hypertension Sister    Social History Social History  Substance Use Topics  . Smoking status: Never Smoker  . Smokeless tobacco: Never Used  . Alcohol use No  lives with son and  granddaughter Uses a can and walker  Allergies   Shrimp [shellfish allergy]  Review of Systems Review of Systems  Musculoskeletal: Positive for arthralgias. Negative for back pain and neck pain.  Neurological: Negative for syncope, weakness and numbness.  All other systems reviewed and are negative.  Physical Exam Updated Vital Signs BP 130/77 (BP Location: Right Arm)   Pulse 62   Temp 98 F (36.7 C) (Oral)   Resp 18   SpO2 100%   Physical Exam  Constitutional: She is oriented to person, place, and time. She appears well-developed and well-nourished.  Non-toxic appearance. She does not appear ill. No distress.  HENT:  Head: Normocephalic and atraumatic.  Right Ear: External ear normal.  Left Ear: External ear normal.  Nose: Nose normal. No mucosal edema or rhinorrhea.  Mouth/Throat: Oropharynx is clear and moist and mucous membranes are normal. No dental abscesses or uvula swelling.  Eyes: Conjunctivae and EOM are normal. Pupils are equal, round, and reactive to light.  Neck: Normal range of motion and full passive range of motion without pain. Neck supple.  Cardiovascular: Normal rate, regular rhythm and normal heart sounds.  Exam reveals no gallop and no friction rub.   No murmur heard. Pulmonary/Chest: Effort normal and breath sounds normal. No respiratory distress. She has no wheezes. She has no rhonchi. She has no rales. She exhibits no tenderness and no crepitus.  Abdominal: Soft. Normal appearance and bowel sounds are normal. She exhibits no distension. There is no tenderness. There is no rebound and no guarding.  Musculoskeletal: Normal range of motion. She exhibits no edema or tenderness.   No shortening of either leg. Pt voluntarily lifts both her legs off of the bed. When I try to palpate her hip area where she states she's hurt she's tender diffusely and grabs my hand when I palpate her over the true hip joint and also laterally.  Neurological: She is alert and  oriented to person, place, and time. She has normal strength. No cranial nerve deficit.  Skin: Skin is warm, dry and intact. No rash noted. No erythema. No pallor.  Psychiatric: She has a normal mood and affect. Her speech is normal and behavior is normal. Her mood appears not anxious.  Nursing note and vitals reviewed.  ED Treatments / Results  DIAGNOSTIC STUDIES: Oxygen Saturation is 98% on room air, normal by my interpretation.     Radiology Dg Hip Unilat W Or Wo Pelvis 2-3 Views Right  Result Date: 07/09/2016 CLINICAL DATA:  Injury to right hip while reaching for object. Unable to bear weight on right leg, with posterior right hip pain. Initial encounter. EXAM: DG HIP (WITH OR WITHOUT PELVIS) 2-3V RIGHT COMPARISON:  None. FINDINGS: There is no evidence of fracture or dislocation. The patient's right hip prosthesis appears grossly intact, without evidence of loosening. The left hip joint is  grossly unremarkable in appearance. Mild heterotopic bone formation is noted about the right hip. Mild degenerative change is noted at the pubic symphysis. The sacroiliac joints are unremarkable. The visualized bowel gas pattern is within normal limits. IMPRESSION: No evidence of fracture or dislocation. Right hip prosthesis appears grossly intact, without evidence of loosening. Electronically Signed   By: Garald Balding M.D.   On: 07/09/2016 03:50    Procedures Procedures (including critical care time)  Medications Ordered in ED Medications  fentaNYL (SUBLIMAZE) injection 50 mcg (50 mcg Intramuscular Given 07/09/16 0358)    Initial Impression / Assessment and Plan / ED Course  I have reviewed the triage vital signs and the nursing notes.  Pertinent labs & imaging results that were available during my care of the patient were reviewed by me and considered in my medical decision making (see chart for details).  Clinical Course   COORDINATION OF CARE: 3:13 AM Discussed treatment plan with pt at  bedside and pt agreed to plan. Patient was given IM fentanyl for pain and x-ray was obtained of her right hip.  Recheck at 5:30 AM patient now indicates her pain is over the greater trochanter of her right hip. She denies having any pain medially over the pelvic area or the true hip joint. She states she's had bursitis in the past and she suspects this may be another episode of bursitis. We discussed follow-up with Dr. Lorin Welch her orthopedist and she was placed on anti-inflammatory pain medication. She states she mainly uses a walker to get around and she was encouraged to continue using the walker. It should be noted though that when she came to the ED she brought a cane. Her last BUN and creatinine were in April 2016 and were 5 and 0.54 respectively.  Final Clinical Impressions(s) / ED Diagnoses   Final diagnoses:  Hip pain, right  Trochanteric bursitis of right hip   New Prescriptions New Prescriptions   NAPROXEN (NAPROSYN) 250 MG TABLET    Take 1 tablet (250 mg total) by mouth 2 (two) times daily.    Plan discharge  Peggy Porter, MD, FACEP  I personally performed the services described in this documentation, which was scribed in my presence. The recorded information has been reviewed and considered.  Peggy Porter, MD, Barbette Or, MD 07/09/16 [phone]

## 2016-07-09 NOTE — ED Notes (Signed)
Bed: WA17 Expected date:  Expected time:  Means of arrival:  Comments: 75 yo F/hip

## 2016-07-09 NOTE — ED Triage Notes (Signed)
Per EMS, pt is from home with complaints of hip pain. Pt reports being on the toilet and reaching for toilet paper when suddenly it felt as if "my hip went out". EMS reports that pt was able to pivot from the bed to the stretcher at her home. Pt stated that she had hip surgery within the last year.

## 2017-02-12 ENCOUNTER — Ambulatory Visit (INDEPENDENT_AMBULATORY_CARE_PROVIDER_SITE_OTHER): Payer: Medicare (Managed Care)

## 2017-02-12 ENCOUNTER — Ambulatory Visit (INDEPENDENT_AMBULATORY_CARE_PROVIDER_SITE_OTHER): Payer: Medicare (Managed Care) | Admitting: Physician Assistant

## 2017-02-12 DIAGNOSIS — M65331 Trigger finger, right middle finger: Secondary | ICD-10-CM

## 2017-02-12 DIAGNOSIS — M25531 Pain in right wrist: Secondary | ICD-10-CM | POA: Diagnosis not present

## 2017-02-12 MED ORDER — LIDOCAINE HCL 1 % IJ SOLN
0.5000 mL | INTRAMUSCULAR | Status: AC | PRN
  Administered 2017-02-12: .5 mL

## 2017-02-12 MED ORDER — METHYLPREDNISOLONE ACETATE 40 MG/ML IJ SUSP
20.0000 mg | INTRAMUSCULAR | Status: AC | PRN
  Administered 2017-02-12: 20 mg

## 2017-02-12 NOTE — Progress Notes (Signed)
Office Visit Note   Patient: Peggy Welch           Date of Birth: Oct 11, 1940           MRN: [id number] Visit Date: 02/12/2017              Requested by: Lucianne Lei, [address] [place], [place] [address] PCP: Lucianne Lei, MD   Assessment & Plan: Visit Diagnoses:  1. Pain in right wrist     Plan: Discussed with her the cause of her triggering at the A1 pulley. She will follow up with [place] if she has any further triggering or concerns or if she gets no relief with the injection. Questions were encouraged and answered.  Follow-Up Instructions: Return if symptoms worsen or fail to improve.   Orders:  Orders Placed This Encounter  Procedures  . Hand/Upper Extremity Injection/Arthrocentesis  . XR Wrist 2 Views Right   No orders of the defined types were placed in this encounter.     Procedures: Hand/UE Inj Date/Time: 02/12/2017 6:17 PM Performed by: Pete Pelt Authorized by: Pete Pelt   Consent Given by:  Patient Indications:  Pain and therapeutic Condition: trigger finger   Location:  Long finger Site:  R long A1 Needle Size:  25 G Approach:  Volar Ultrasound Guidance: No   Medications:  0.5 mL lidocaine 1 %; 20 mg methylPREDNISolone acetate 40 MG/ML Patient tolerance:  Patient tolerated the procedure well with no immediate complications     Clinical Data: No additional findings.   Subjective: Right wrist pain   HPI Peggy Welch is a 76 year old female comes in today with 1 week history of "right wrist pain. She's taken Tylenol with no relief. On further discussion with her she is actually having locking of her right middle finger. He is keeping her awake at night. Is actually getting stuck and she isn't having to manipulate the finger to get it out straight. She is nondiabetic. She's never had any problems with her fingers triggering in the past. She is a patient Dr. Lorin Mercy.  Review of Systems He is had no fevers chills shortness  breath calf pain no injury to the right wrist or right hand.  Objective: Vital Signs: There were no vitals taken for this visit.  Physical Exam  Constitutional: She is oriented to person, place, and time. She appears well-developed and well-nourished. No distress.  Pulmonary/Chest: Effort normal.  Neurological: She is alert and oriented to person, place, and time.  Skin: She is not diaphoretic.  Psychiatric: She has a normal mood and affect.    Ortho Exam Right wrist she has good range of motion of the wrist without pain. There is no swelling about the wrist and no rashes skin lesions ulcerations or ecchymosis about the wrist. She has tenderness over the right middle finger A1 pulley. She has active triggering of the middle finger. Maner the fingers do not trigger. She is full motor and full sensation of the hand. Radial pulses 2+ Specialty Comments:  No specialty comments available.  Imaging: Xr Wrist 2 Views Right  Result Date: 02/12/2017 AP lateral views of the right wrist: No acute fractures no bony abnormalities. The radial carpal and ulnar carpal joints are well maintained. Carpal bones are all well aligned. Metacarpals with no obvious fracture.    PMFS History: Patient Active Problem List   Diagnosis Date Noted  . Breast cancer of lower-inner quadrant of right female breast ([facility]) 03/05/2015  .  Postoperative anemia due to acute blood loss 12/01/2014  . Cough 12/01/2014  . HCAP (healthcare-associated pneumonia) 11/30/2014  . Right lower lobe pneumonia ([facility]) 11/30/2014  . Sepsis ([facility]) 11/30/2014  . 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 Hypokalemia 10/14/2011  . HTN (hypertension) 10/14/2011   Past Medical History:  Diagnosis Date  . Arthritis   . Asthma   . Asthma exacerbation 10/14/2011  . Blood transfusion without reported diagnosis   . Hepatitis C    from blood transfusion  . Hypertension   . Pneumonia 4/02016    Family History  Problem Relation Age of  Onset  . Hypertension Sister     Past Surgical History:  Procedure Laterality Date  . CHOLECYSTECTOMY  02/21/2002  . JOINT REPLACEMENT     Bilateral total knee replacements  . knee replacements Bilateral   . Right Rotator Cuff Repair  08/12/2006  . TOTAL HIP ARTHROPLASTY Right 11/22/2014   dr Lorin Mercy  . TOTAL HIP ARTHROPLASTY Right 11/22/2014   Procedure: TOTAL HIP ARTHROPLASTY ANTERIOR APPROACH;  Surgeon: Marybelle Killings, MD;  Location: [facility];  Service: Orthopedics;  Laterality: Right;   Social History   Occupational History  . Not on file.   Social History Main Topics  . Smoking status: Never Smoker  . Smokeless tobacco: Never Used  . Alcohol use No  . Drug use: No  . Sexual activity: No

## 2017-04-29 ENCOUNTER — Ambulatory Visit (INDEPENDENT_AMBULATORY_CARE_PROVIDER_SITE_OTHER): Payer: Self-pay | Admitting: Orthopaedic Surgery

## 2017-07-10 DIAGNOSIS — D508 Other iron deficiency anemias: Secondary | ICD-10-CM | POA: Diagnosis not present

## 2017-07-10 DIAGNOSIS — C50112 Malignant neoplasm of central portion of left female breast: Secondary | ICD-10-CM | POA: Diagnosis not present

## 2017-07-10 DIAGNOSIS — E118 Type 2 diabetes mellitus with unspecified complications: Secondary | ICD-10-CM | POA: Diagnosis not present

## 2017-07-10 DIAGNOSIS — D519 Vitamin B12 deficiency anemia, unspecified: Secondary | ICD-10-CM | POA: Diagnosis not present

## 2017-07-10 DIAGNOSIS — I10 Essential (primary) hypertension: Secondary | ICD-10-CM | POA: Diagnosis not present

## 2017-07-15 DIAGNOSIS — C50112 Malignant neoplasm of central portion of left female breast: Secondary | ICD-10-CM | POA: Diagnosis not present

## 2017-07-15 DIAGNOSIS — I10 Essential (primary) hypertension: Secondary | ICD-10-CM | POA: Diagnosis not present

## 2017-07-19 DIAGNOSIS — J449 Chronic obstructive pulmonary disease, unspecified: Secondary | ICD-10-CM | POA: Diagnosis not present

## 2017-08-18 DIAGNOSIS — J449 Chronic obstructive pulmonary disease, unspecified: Secondary | ICD-10-CM | POA: Diagnosis not present

## 2017-09-18 DIAGNOSIS — J449 Chronic obstructive pulmonary disease, unspecified: Secondary | ICD-10-CM | POA: Diagnosis not present

## 2017-10-19 DIAGNOSIS — J449 Chronic obstructive pulmonary disease, unspecified: Secondary | ICD-10-CM | POA: Diagnosis not present

## 2017-11-13 DIAGNOSIS — H902 Conductive hearing loss, unspecified: Secondary | ICD-10-CM | POA: Diagnosis not present

## 2017-11-13 DIAGNOSIS — Z0000 Encounter for general adult medical examination without abnormal findings: Secondary | ICD-10-CM | POA: Diagnosis not present

## 2017-11-13 DIAGNOSIS — C50112 Malignant neoplasm of central portion of left female breast: Secondary | ICD-10-CM | POA: Diagnosis not present

## 2017-11-13 DIAGNOSIS — I10 Essential (primary) hypertension: Secondary | ICD-10-CM | POA: Diagnosis not present

## 2017-11-13 DIAGNOSIS — M130 Polyarthritis, unspecified: Secondary | ICD-10-CM | POA: Diagnosis not present

## 2017-11-16 DIAGNOSIS — J449 Chronic obstructive pulmonary disease, unspecified: Secondary | ICD-10-CM | POA: Diagnosis not present

## 2017-11-26 DIAGNOSIS — D649 Anemia, unspecified: Secondary | ICD-10-CM | POA: Diagnosis not present

## 2017-11-26 DIAGNOSIS — D519 Vitamin B12 deficiency anemia, unspecified: Secondary | ICD-10-CM | POA: Diagnosis not present

## 2017-11-26 DIAGNOSIS — M130 Polyarthritis, unspecified: Secondary | ICD-10-CM | POA: Diagnosis not present

## 2017-11-26 DIAGNOSIS — C50112 Malignant neoplasm of central portion of left female breast: Secondary | ICD-10-CM | POA: Diagnosis not present

## 2017-11-26 DIAGNOSIS — D509 Iron deficiency anemia, unspecified: Secondary | ICD-10-CM | POA: Diagnosis not present

## 2017-11-26 DIAGNOSIS — I10 Essential (primary) hypertension: Secondary | ICD-10-CM | POA: Diagnosis not present

## 2017-12-01 ENCOUNTER — Telehealth: Payer: Self-pay | Admitting: *Deleted

## 2017-12-01 DIAGNOSIS — C50911 Malignant neoplasm of unspecified site of right female breast: Secondary | ICD-10-CM | POA: Diagnosis not present

## 2017-12-01 NOTE — Telephone Encounter (Signed)
Received a call from [facility] and Dr. Luan Pulling at [place]. Need urgent appt for previous pt seen in 2016 that declined tx. Gave appt date and time of 4/12 at 2:30.

## 2017-12-03 NOTE — Progress Notes (Signed)
[facility]  [phone] [phone]  Clinic Follow Up Note   Patient Care Team: Lucianne Lei, MD as PCP - General (Family Medicine) Rockwell Germany, RN as Registered Nurse Mauro Kaufmann, RN as Registered Nurse Kyung Rudd, MD as Consulting Physician (Radiation Oncology) Coralie Keens, MD as Consulting Physician (General Surgery) 12/04/2017  CHIEF COMPLAINTS:  Follow up right breast cancer.    Breast cancer of lower-inner quadrant of right female breast ([facility])   02/27/2015 Receptors her2    ER 95%+, PR-, HER2 (-), KI67 5%       02/27/2015 Initial Biopsy    right breast mass and axillary node biopsy showed invasive lobular carcinoma, (+) LCIS       03/05/2015 Initial Diagnosis    Breast cancer of lower-inner quadrant of right female breast      03/08/2015 Imaging    Breast MRI showed:  Spiculated biopsy proven malignancy in the lower inner R breast  2.1 x 2.5 x 2.3 cm,  6 mm oval mass in the R breast 9 o'clock position, Oval ehancing nodules (5-6 mm) in the upper-outer R breast, and known enlarged LN, left breast (-)      12/01/2017 Imaging    Breast US 12/01/17 IMPRESSION 1. Worsening invasive lobular carcinoma of the right breast with worsening metastatic axillary adenopathy. In addition. Physical exam findings consistent with skin invasion, and concerning for possible invasion of the sternum.  2. The indeterminate left breast calcifications were discussed with the patient and her sister. Given the extent of the disease in the right breast, and possibility of advanced metastatic disease, further work-up of these calcifications will be deferred until after the patient is seen by Dr. Burr Medico. These could be biopsied in the future if it would be helpful for treatment of patient's disease.       12/01/2017 Imaging    Diagnostic Mammogram 12/01/17 IMPRESSION 1. New palpable lump in the upper outer right breast corresponds to a large 5 cm mass in the  right axillary tail concerning for worsening conglomerate adenopathy vs spread of patient's known malignancy 2. In addition, increased size of previously biopsied lobular carcinoma in the medial lower right breast with new skin thickening and nipple retraction concerning for new skin involvement. 3. Increased size and number of indeterminate clustered calcifications in the left upper outer breast middle depth.         HISTORY OF PRESENTING ILLNESS (03/20/2015):  Peggy Welch 77 y.o. female is here because of recently you diagnosed right breast cancer.  She felt a lump in the right breast about 2 months ago, nontender, no skin or nipple change. She was seen by her primary care physician, mammogram showed a right breast mass. She underwent a core needle biopsy of the right breast mass on 02/27/2015, which revealed invasive lobular carcinoma, ER positive, PR negative, HER-2 negative. She was seen by Dr. Ninfa Linden, and was referred to [place] to discuss treatment options.  She has moderate arthritis, had a bilateral knee replacement, and recently right hip replacement in April. She has mild to moderate arthritis pain, but remains fairly active. She lives with her granddaughter, completely independent, goes out with her sister for shopping, etc she denies any other significant pain, no dyspnea, no GI symptoms. She has good appetite and eats well. No recent weight loss.  INTERIM HISTORY Peggy Welch returns to the clinic for follow-up. I last saw her in Aug of 2016 when she declined any treatment for her  breast cancer. She presents to the clinic today accompanied by her sister and daughter. Pt has dementia so her sister and daughter helped to provide some of this history. Her sister reports that her right breast became swollen and she noticed some skin change. Pt reports that she feels well and her daughter says that she walks every day. Pt has been using a bandage over a small open wound.   She had  mammogram and [place] on 12/01/17 that revealed worsening invasive lobular carcinoma of the right breast with worsening metastatic axillary adenopathy. In addition. Physical exam findings consistent with skin invasion, and concerning for possible invasion of the sternum.  On review of systems, pt denies breast pain, back pain, hip pain or any other complaints at this time. Pertinent positives are listed and detailed within the above HPI.   MEDICAL HISTORY:  Past Medical History:  Diagnosis Date  . Arthritis   . Asthma   . Asthma exacerbation 10/14/2011  . Blood transfusion without reported diagnosis   . Hepatitis C    from blood transfusion  . Hypertension   . Pneumonia 4/02016    SURGICAL HISTORY: Past Surgical History:  Procedure Laterality Date  . CHOLECYSTECTOMY  02/21/2002  . JOINT REPLACEMENT     Bilateral total knee replacements  . knee replacements Bilateral   . Right Rotator Cuff Repair  08/12/2006  . TOTAL HIP ARTHROPLASTY Right 11/22/2014   dr Lorin Mercy  . TOTAL HIP ARTHROPLASTY Right 11/22/2014   Procedure: TOTAL HIP ARTHROPLASTY ANTERIOR APPROACH;  Surgeon: Marybelle Killings, MD;  Location: [facility];  Service: Orthopedics;  Laterality: Right;    SOCIAL HISTORY: Social History   Socioeconomic History  . Marital status: Legally Separated    Spouse name: Not on file  . Number of children: Not on file  . Years of education: Not on file  . Highest education level: Not on file  Occupational History  . Not on file  Social Needs  . Financial resource strain: Not on file  . Food insecurity:    Worry: Not on file    Inability: Not on file  . Transportation needs:    Medical: Not on file    Non-medical: Not on file  Tobacco Use  . Smoking status: Never Smoker  . Smokeless tobacco: Never Used  Substance and Sexual Activity  . Alcohol use: No  . Drug use: No  . Sexual activity: Never  Lifestyle  . Physical activity:    Days per week: Not on file    Minutes per session: Not on  file  . Stress: Not on file  Relationships  . Social connections:    Talks on phone: Not on file    Gets together: Not on file    Attends religious service: Not on file    Active member of club or organization: Not on file    Attends meetings of clubs or organizations: Not on file    Relationship status: Not on file  . Intimate partner violence:    Fear of current or ex partner: Not on file    Emotionally abused: Not on file    Physically abused: Not on file    Forced sexual activity: Not on file  Other Topics Concern  . Not on file  Social History Narrative  . Not on file    FAMILY HISTORY: Family History  Problem Relation Age of Onset  . Hypertension Sister     ALLERGIES:  is allergic to shrimp [shellfish allergy].  MEDICATIONS:  Current Outpatient Medications  Medication Sig Dispense Refill  . acetaminophen (TYLENOL) 650 MG CR tablet Take 650 mg by mouth every 8 (eight) hours as needed for pain (pain).    Marland Kitchen albuterol (PROVENTIL) (2.5 MG/3ML) 0.083% nebulizer solution Take 2.5 mg by nebulization every 4 (four) hours as needed for wheezing or shortness of breath (wheezing).    Marland Kitchen alendronate (FOSAMAX) 70 MG tablet Take 70 mg by mouth every 7 (seven) days. On Tuesdays. Take with a full glass of water on an empty stomach.    Marland Kitchen aspirin 81 MG tablet Take 81 mg by mouth daily.    . B Complex-C (B-COMPLEX WITH VITAMIN C) tablet Take 1 tablet by mouth daily.    . hydrochlorothiazide (HYDRODIURIL) 25 MG tablet Take 25 mg by mouth daily.    . naproxen (NAPROSYN) 250 MG tablet Take 1 tablet (250 mg total) by mouth 2 (two) times daily. 16 tablet 0  . Potassium Chloride ER 20 MEQ TBCR TK 1 T PO D  0  . potassium chloride SA (K-DUR,KLOR-CON) 20 MEQ tablet Take 20 mEq by mouth daily.  0   No current facility-administered medications for this visit.     REVIEW OF SYSTEMS:   Constitutional: Denies fevers, chills or abnormal night sweats Eyes: Denies blurriness of vision, double vision  or watery eyes Ears, nose, mouth, throat, and face: Denies mucositis or sore throat Respiratory: Denies cough, dyspnea or wheezes Cardiovascular: Denies palpitation, chest discomfort or lower extremity swelling Gastrointestinal:  Denies nausea, heartburn or change in bowel habits Skin: Denies abnormal skin rashes Lymphatics: Denies new lymphadenopathy or easy bruising Neurological:Denies numbness, tingling or new weaknesses (+) dementia Behavioral/Psych: Mood is stable, no new changes  Breast: (+) right breast swelling and skin change All other systems were reviewed with the patient and are negative.  PHYSICAL EXAMINATION: ECOG PERFORMANCE STATUS: 1 - Symptomatic but completely ambulatory  Vitals:   12/04/17 1448  BP: (!) 150/70  Pulse: 75  Resp: 17  Temp: 98.1 F (36.7 C)  SpO2: 100%   Filed Weights   12/04/17 1448  Weight: 187 lb 8 oz (85 kg)    GENERAL:alert, no distress and comfortable SKIN: skin color, texture, turgor are normal, no rashes or significant lesions EYES: normal, conjunctiva are pink and non-injected, sclera clear OROPHARYNX:no exudate, no erythema and lips, buccal mucosa, and tongue normal  NECK: supple, thyroid normal size, non-tender, without nodularity LYMPH:  no palpable lymphadenopathy in the cervical, axillary or inguinal LUNGS: clear to auscultation and percussion with normal breathing effort HEART: regular rate & rhythm (+) 3/6 systolic murmur  ABDOMEN:abdomen soft, non-tender and normal bowel sounds. NO organomegaly  Musculoskeletal:no cyanosis of digits and no clubbing  PSYCH: alert & oriented x 3 with fluent speech NEURO: no focal motor/sensory deficits Breasts: Her right breast is deformed with nipple retraction towards left inner quadrant. There is a 5 x 5 cm mass in the inner quadrant of right breast involving the nipple and areolae area with small opening. No discharge or bleeding. There is also 2 x 3 cm in the right upper outer quadrant in  the left breast. There is a 3 cm moveable lymph node in the right axilla.   LABORATORY DATA:  I have reviewed the data as listed Lab Results  Component Value Date   WBC 6.6 03/27/2015   HGB 12.2 03/27/2015   HCT 37.1 03/27/2015   MCV 91.4 03/27/2015   PLT 305 03/27/2015   No results for input(s): NA,  K, CL, CO2, GLUCOSE, BUN, CREATININE, CALCIUM, GFRNONAA, GFRAA, PROT, ALBUMIN, AST, ALT, ALKPHOS, BILITOT, BILIDIR, IBILI in the last 8760 hours.  PATHOLOGY REPORT Diagnosis 02/27/2015  1. Breast, right, needle core biopsy - INVASIVE LOBULAR CARCINOMA. - LOBULAR CARCINOMA IN SITU. - SEE COMMENT. 2. Lymph node, needle/core biopsy, right axilla - ONE LYMPH NODE POSITIVE FOR METASTATIC LOBULAR CARCINOMA (1/1). - SEE COMMENT. Microscopic Comment 1. An E-cadherin immunohistochemical stain is performed on the right needle core biopsy. The stain is negative in both the in situ and invasive components confirming the above diagnosis. Although definitive grading of breast carcinoma is best done on excision, the features of the invasive tumor from the right needle core biopsy are compatible with a grade 1-2 breast carcinoma. Breast prognostic markers will be performed and reported in an addendum. Findings are called to [facility] on 03/01/2015. Dr. Lyndon Code has seen the first specimen in consultation with agreement. 2. A cytokeratin AE1/AE3 immunohistochemical stain is performed on the second specimen (right axillary lymph node biopsy). The stain highlights scattered tumor cells confirming the above diagnosis. Dr. Lyndon Code has seen the second specimen in consultation with agreement. A breast prognostic profile will also be performed on the right axillary lymph node and reported in an addendum to follow. (RH:ecj 03/01/2015)  1. PROGNOSTIC INDICATORS - ACIS (BLOCK 1A) Results: IMMUNOHISTOCHEMICAL AND MORPHOMETRIC ANALYSIS BY THE AUTOMATED CELLULAR IMAGING SYSTEM (ACIS) Estrogen Receptor: 95%,  POSITIVE, STRONG STAINING INTENSITY (PERFORMED MANUALLY) Progesterone Receptor: 0%, NEGATIVE, (PERFORMED MANUALLY) Proliferation Marker Ki67: 5% (PERFORMED MANUALLY) COMMENT: The negative hormone receptor study(ies) in this case has an internal positive control. Results: HER2 - NEGATIVE RATIO OF HER2/CEP17 SIGNALS 1.28 AVERAGE HER2 COPY NUMBER PER CELL 2.30  2. PROGNOSTIC INDICATORS - ACIS (BLOCK 2A) Results: IMMUNOHISTOCHEMICAL AND MORPHOMETRIC ANALYSIS BY THE AUTOMATED CELLULAR IMAGING SYSTEM (ACIS) Estrogen Receptor: 95%, POSITIVE, STRONG STAINING INTENSITY (PERFORMED MANUALLY) Progesterone Receptor: 0%, NEGATIVE, (PERFORMED MANUALLY) Proliferation Marker Ki67: 5% (PERFORMED MANUALLY) COMMENT: The negative hormone receptor study(ies) in this case has no internal positive control. 2. FLUORESCENCE IN-SITU HYBRIDIZATION Results: HER2 - NEGATIVE RATIO OF HER2/CEP17 SIGNALS 1.10 AVERAGE HER2 COPY NUMBER PER CELL 2.20  RADIOGRAPHIC STUDIES: I have personally reviewed the radiological images as listed and agreed with the findings in the report.  Breast US 12/01/17 IMPRESSION 1. Worsening invasive lobular carcinoma of the right breast with worsening metastatic axillary adenopathy. In addition. Physical exam findings consistent with skin invasion, and concerning for possible invasion of the sternum.  2. The indeterminate left breast calcifications were discussed with the patient and her sister. Given the extent of the disease in the right breast, and possibility of advanced metastatic disease, further work-up of these calcifications will be deferred until after the patient is seen by Dr. Burr Medico. These could be biopsied in the future if it would be helpful for treatment of patient's disease.   Diagnostic Mammogram 12/01/17 IMPRESSION 1. New palpable lump in the upper outer right breast corresponds to a large 5 cm mass in the right axillary tail concerning for worsening conglomerate adenopathy vs  spread of patient's known malignancy 2. In addition, increased size of previously biopsied lobular carcinoma in the medial lower right breast with new skin thickening and nipple retraction concerning for new skin involvement. 3. Increased size and number of indeterminate clustered calcifications in the left upper outer breast middle depth.   Mr Breast Bilateral W Wo Contrast  03/08/2015    IMPRESSION: 1. Spiculated biopsy proven malignancy in the lower inner right breast measures  2.1 x 2.5 x 2.3 cm.  2. 6 mm oval mass in the slightly outer right breast at the approximate 9 o'clock position.  3.  Oval enhancing nodules (5-6 mm) in the upper-outer right breast.  4. Enlarged lymph node in the upper-outer right breast compatible with biopsy proven nodal metastases.  5. No MRI evidence of malignancy in the left breast.   RECOMMENDATION: 1. Second-look ultrasound with biopsy for the 6 mm oval mass in the slightly outer right breast at 9 o'clock.  2. Second-look ultrasound with biopsy for one of the enhancing nodules seen in the upper-outer right breast.  If these nodules cannot be seen via ultrasound, then MRI guided biopsy is recommended if considering breast conservation.  BI-RADS CATEGORY  4: Suspicious.   Electronically Signed   By: Everlean Alstrom M.D.   On: 03/08/2015 12:41    ASSESSMENT & PLAN:  77 y.o.  African-American female, with past medical history of osteoarthritis, status post multiple orthopedic surgeries, but is still completely independent and active.  1. Right breast invasive lobular carcinoma, cT2N1M0, stage IIB, ER+/PR-/HER2-, with 2 indeterminate right breast lesions, untreated, now with disease progression  -She was initially diagnosed in 02/2015. I previously reviewed the natural history of her breast cancer, and the standard treatment option with surgery, which is the only way to cure the cancer. The consequence of a untreated breast cancer, which is likely going to metastasize and  becomes incurable in the future, was discussed with her and her sister in great detail. She voiced good understanding -She declined any breast cancer treatment at her diagnosis and did not follow up.  -Pt presented back to the clinic today (12/04/17) with obvious clinical and radiographic cancer progression. Her mammogram and [place] on 12/01/17 that revealed larger inner lower quadrant breast tumor with skin invasion, and possible stone invasion on mammogram.  She also has a new palpable mass in the right upper quadrant of right breast, and bulky adenopathy. -I had a long conversation about nature history of untreated breast cancer and consequence, which include but not limited to open wound, bleeding, pain, bone pain from metastasis, and eventually taking her life  -I recommand staging scan with CT chest, abdomen and pelvis with contrast, and a bone scan, to rule out distant metastasis  -if her disease is only locally advanced without distant metastasis, I recommend her to have surgery with Dr. Ninfa Linden.  He would need right mastectomy and axillary lymph node dissection. -Pt is still not very interested in treatment, her daughter encouraged her to get scans done first. Pt has agreed to return to discuss her imaging results at this time.  -Pt agreed to have lab today   2. Osteoporosis  -She takes Fosamax, managed by her PCP Dr. Criss Rosales    Plan -Lab today -Whole Body Bone Scan and CT CAP W Contrast next week -F/u in 2-3 weeks    All questions were answered. The patient knows to call the clinic with any problems, questions or concerns. I spent 30 minutes counseling the patient face to face. The total time spent in the appointment was 40 minutes and more than 50% was on counseling.   This document serves as a record of services personally performed by Truitt Merle, MD. It was created on her behalf by Theresia Bough, a trained medical scribe. The creation of this record is based on the scribe's personal  observations and the provider's statements to them.   I have reviewed the above documentation for accuracy  and completeness, and I agree with the above.    Truitt Merle, MD 12/04/2017 3:34 PM

## 2017-12-04 ENCOUNTER — Inpatient Hospital Stay: Payer: Medicare Other | Attending: Hematology | Admitting: Hematology

## 2017-12-04 ENCOUNTER — Inpatient Hospital Stay: Payer: Medicare Other

## 2017-12-04 ENCOUNTER — Telehealth: Payer: Self-pay | Admitting: Hematology

## 2017-12-04 VITALS — BP 150/70 | HR 75 | Temp 98.1°F | Resp 17 | Ht 64.0 in | Wt 187.5 lb

## 2017-12-04 DIAGNOSIS — C7951 Secondary malignant neoplasm of bone: Secondary | ICD-10-CM | POA: Insufficient documentation

## 2017-12-04 DIAGNOSIS — C50311 Malignant neoplasm of lower-inner quadrant of right female breast: Secondary | ICD-10-CM | POA: Diagnosis not present

## 2017-12-04 DIAGNOSIS — Z79899 Other long term (current) drug therapy: Secondary | ICD-10-CM | POA: Diagnosis not present

## 2017-12-04 DIAGNOSIS — R590 Localized enlarged lymph nodes: Secondary | ICD-10-CM | POA: Insufficient documentation

## 2017-12-04 DIAGNOSIS — R1901 Right upper quadrant abdominal swelling, mass and lump: Secondary | ICD-10-CM | POA: Diagnosis not present

## 2017-12-04 DIAGNOSIS — C50411 Malignant neoplasm of upper-outer quadrant of right female breast: Secondary | ICD-10-CM | POA: Diagnosis not present

## 2017-12-04 DIAGNOSIS — M810 Age-related osteoporosis without current pathological fracture: Secondary | ICD-10-CM | POA: Diagnosis not present

## 2017-12-04 DIAGNOSIS — C7800 Secondary malignant neoplasm of unspecified lung: Secondary | ICD-10-CM | POA: Diagnosis not present

## 2017-12-04 DIAGNOSIS — Z7982 Long term (current) use of aspirin: Secondary | ICD-10-CM

## 2017-12-04 DIAGNOSIS — Z170 Estrogen receptor positive status [ER+]: Principal | ICD-10-CM

## 2017-12-04 DIAGNOSIS — C773 Secondary and unspecified malignant neoplasm of axilla and upper limb lymph nodes: Secondary | ICD-10-CM

## 2017-12-04 DIAGNOSIS — Z79811 Long term (current) use of aromatase inhibitors: Secondary | ICD-10-CM | POA: Insufficient documentation

## 2017-12-04 DIAGNOSIS — F0390 Unspecified dementia without behavioral disturbance: Secondary | ICD-10-CM

## 2017-12-04 LAB — CBC WITH DIFFERENTIAL (CANCER CENTER ONLY)
BASOS ABS: 0 10*3/uL (ref 0.0–0.1)
Basophils Relative: 1 %
EOS ABS: 0.3 10*3/uL (ref 0.0–0.5)
EOS PCT: 4 %
HCT: 36 % (ref 34.8–46.6)
HEMOGLOBIN: 11.5 g/dL — AB (ref 11.6–15.9)
Lymphocytes Relative: 41 %
Lymphs Abs: 2.9 10*3/uL (ref 0.9–3.3)
MCH: 30.2 pg (ref 25.1–34.0)
MCHC: 31.9 g/dL (ref 31.5–36.0)
MCV: 94.5 fL (ref 79.5–101.0)
Monocytes Absolute: 0.6 10*3/uL (ref 0.1–0.9)
Monocytes Relative: 8 %
NEUTROS PCT: 46 %
Neutro Abs: 3.4 10*3/uL (ref 1.5–6.5)
PLATELETS: 334 10*3/uL (ref 145–400)
RBC: 3.81 MIL/uL (ref 3.70–5.45)
RDW: 13.7 % (ref 11.2–14.5)
WBC: 7.2 10*3/uL (ref 3.9–10.3)

## 2017-12-04 LAB — CMP (CANCER CENTER ONLY)
ALT: 17 U/L (ref 0–55)
AST: 29 U/L (ref 5–34)
Albumin: 3.7 g/dL (ref 3.5–5.0)
Alkaline Phosphatase: 64 U/L (ref 40–150)
Anion gap: 9 (ref 3–11)
BILIRUBIN TOTAL: 0.6 mg/dL (ref 0.2–1.2)
BUN: 11 mg/dL (ref 7–26)
CO2: 29 mmol/L (ref 22–29)
CREATININE: 0.82 mg/dL (ref 0.60–1.10)
Calcium: 9.4 mg/dL (ref 8.4–10.4)
Chloride: 104 mmol/L (ref 98–109)
Glucose, Bld: 88 mg/dL (ref 70–140)
POTASSIUM: 3.7 mmol/L (ref 3.5–5.1)
Sodium: 142 mmol/L (ref 136–145)
TOTAL PROTEIN: 7.6 g/dL (ref 6.4–8.3)

## 2017-12-04 NOTE — Telephone Encounter (Signed)
Scheduled appt per 4/12 los - Gave patient AVS and calender per los.  

## 2017-12-05 ENCOUNTER — Encounter: Payer: Self-pay | Admitting: Hematology

## 2017-12-07 DIAGNOSIS — Z1211 Encounter for screening for malignant neoplasm of colon: Secondary | ICD-10-CM | POA: Diagnosis not present

## 2017-12-07 DIAGNOSIS — Z1212 Encounter for screening for malignant neoplasm of rectum: Secondary | ICD-10-CM | POA: Diagnosis not present

## 2017-12-09 DIAGNOSIS — I10 Essential (primary) hypertension: Secondary | ICD-10-CM | POA: Diagnosis not present

## 2017-12-14 ENCOUNTER — Ambulatory Visit (HOSPITAL_COMMUNITY)
Admission: RE | Admit: 2017-12-14 | Discharge: 2017-12-14 | Disposition: A | Discharge status: Home / Self Care | Payer: Medicare Other | Source: Ambulatory Visit | Attending: Hematology | Admitting: Hematology

## 2017-12-14 ENCOUNTER — Encounter (HOSPITAL_COMMUNITY): Payer: Self-pay

## 2017-12-14 DIAGNOSIS — C50911 Malignant neoplasm of unspecified site of right female breast: Secondary | ICD-10-CM | POA: Diagnosis not present

## 2017-12-14 DIAGNOSIS — M899 Disorder of bone, unspecified: Secondary | ICD-10-CM | POA: Insufficient documentation

## 2017-12-14 DIAGNOSIS — Z170 Estrogen receptor positive status [ER+]: Secondary | ICD-10-CM | POA: Diagnosis not present

## 2017-12-14 DIAGNOSIS — C50311 Malignant neoplasm of lower-inner quadrant of right female breast: Secondary | ICD-10-CM

## 2017-12-14 DIAGNOSIS — M8448XA Pathological fracture, other site, initial encounter for fracture: Secondary | ICD-10-CM | POA: Diagnosis not present

## 2017-12-14 DIAGNOSIS — R918 Other nonspecific abnormal finding of lung field: Secondary | ICD-10-CM | POA: Insufficient documentation

## 2017-12-14 DIAGNOSIS — N289 Disorder of kidney and ureter, unspecified: Secondary | ICD-10-CM | POA: Diagnosis not present

## 2017-12-14 HISTORY — DX: Malignant (primary) neoplasm, unspecified: C80.1

## 2017-12-14 MED ORDER — TECHNETIUM TC 99M MEDRONATE IV KIT
22.5000 | PACK | Freq: Once | INTRAVENOUS | Status: AC | PRN
  Administered 2017-12-14: 22.5 via INTRAVENOUS

## 2017-12-14 MED ORDER — IOHEXOL 300 MG/ML  SOLN
100.0000 mL | Freq: Once | INTRAMUSCULAR | Status: AC | PRN
  Administered 2017-12-14: 100 mL via INTRAVENOUS

## 2017-12-17 DIAGNOSIS — J449 Chronic obstructive pulmonary disease, unspecified: Secondary | ICD-10-CM | POA: Diagnosis not present

## 2017-12-21 ENCOUNTER — Telehealth: Payer: Self-pay | Admitting: Hematology

## 2017-12-21 NOTE — Telephone Encounter (Signed)
Patient called to verify time  °

## 2017-12-22 ENCOUNTER — Encounter: Payer: Self-pay | Admitting: Hematology

## 2017-12-22 ENCOUNTER — Inpatient Hospital Stay (HOSPITAL_BASED_OUTPATIENT_CLINIC_OR_DEPARTMENT_OTHER): Payer: Medicare Other | Admitting: Hematology

## 2017-12-22 ENCOUNTER — Telehealth: Payer: Self-pay

## 2017-12-22 VITALS — BP 152/56 | HR 70 | Temp 98.6°F | Resp 18 | Ht 64.0 in | Wt 185.0 lb

## 2017-12-22 DIAGNOSIS — Z7189 Other specified counseling: Secondary | ICD-10-CM

## 2017-12-22 DIAGNOSIS — Z79811 Long term (current) use of aromatase inhibitors: Secondary | ICD-10-CM | POA: Diagnosis not present

## 2017-12-22 DIAGNOSIS — C7800 Secondary malignant neoplasm of unspecified lung: Secondary | ICD-10-CM | POA: Diagnosis not present

## 2017-12-22 DIAGNOSIS — Z7982 Long term (current) use of aspirin: Secondary | ICD-10-CM | POA: Diagnosis not present

## 2017-12-22 DIAGNOSIS — C50411 Malignant neoplasm of upper-outer quadrant of right female breast: Secondary | ICD-10-CM | POA: Diagnosis not present

## 2017-12-22 DIAGNOSIS — R590 Localized enlarged lymph nodes: Secondary | ICD-10-CM | POA: Diagnosis not present

## 2017-12-22 DIAGNOSIS — C7951 Secondary malignant neoplasm of bone: Secondary | ICD-10-CM | POA: Diagnosis not present

## 2017-12-22 DIAGNOSIS — C773 Secondary and unspecified malignant neoplasm of axilla and upper limb lymph nodes: Secondary | ICD-10-CM

## 2017-12-22 DIAGNOSIS — Z170 Estrogen receptor positive status [ER+]: Secondary | ICD-10-CM

## 2017-12-22 DIAGNOSIS — R1901 Right upper quadrant abdominal swelling, mass and lump: Secondary | ICD-10-CM | POA: Diagnosis not present

## 2017-12-22 DIAGNOSIS — M810 Age-related osteoporosis without current pathological fracture: Secondary | ICD-10-CM

## 2017-12-22 DIAGNOSIS — C50311 Malignant neoplasm of lower-inner quadrant of right female breast: Secondary | ICD-10-CM

## 2017-12-22 DIAGNOSIS — Z79899 Other long term (current) drug therapy: Secondary | ICD-10-CM | POA: Diagnosis not present

## 2017-12-22 MED ORDER — ANASTROZOLE 1 MG PO TABS: 1.0000 mg | ORAL_TABLET | Freq: Every day | ORAL | 3 refills | Status: DC

## 2017-12-22 MED ORDER — PALBOCICLIB 125 MG PO CAPS: 125.0000 mg | ORAL_CAPSULE | Freq: Every day | ORAL | 0 refills | Status: DC

## 2017-12-22 NOTE — Addendum Note (Signed)
Addended by: Truitt Merle on: 12/22/2017 03:54 PM   Modules accepted: Level of Service

## 2017-12-22 NOTE — Progress Notes (Signed)
[place]  [phone] [phone]  Clinic Follow Up Note   Patient Care Team: Lucianne Lei, MD as PCP - General (Family Medicine) Rockwell Germany, RN as Registered Nurse Mauro Kaufmann, RN as Registered Nurse Kyung Rudd, MD as Consulting Physician (Radiation Oncology) Coralie Keens, MD as Consulting Physician (General Surgery) 12/22/2017  CHIEF COMPLAINTS:  Follow up right breast cancer.    Breast cancer of lower-inner quadrant of right female breast ([place])   02/27/2015 Receptors her2    ER 95%+, PR-, HER2 (-), KI67 5%       02/27/2015 Initial Biopsy    right breast mass and axillary node biopsy showed invasive lobular carcinoma, (+) LCIS       03/05/2015 Initial Diagnosis    Breast cancer of lower-inner quadrant of right female breast      03/08/2015 Imaging    Breast MRI showed:  Spiculated biopsy proven malignancy in the lower inner R breast  2.1 x 2.5 x 2.3 cm,  6 mm oval mass in the R breast 9 o'clock position, Oval ehancing nodules (5-6 mm) in the upper-outer R breast, and known enlarged LN, left breast (-)      12/01/2017 Imaging    Breast US 12/01/17 IMPRESSION 1. Worsening invasive lobular carcinoma of the right breast with worsening metastatic axillary adenopathy. In addition. Physical exam findings consistent with skin invasion, and concerning for possible invasion of the sternum.  2. The indeterminate left breast calcifications were discussed with the patient and her sister. Given the extent of the disease in the right breast, and possibility of advanced metastatic disease, further work-up of these calcifications will be deferred until after the patient is seen by Dr. Burr Medico. These could be biopsied in the future if it would be helpful for treatment of patient's disease.       12/01/2017 Imaging    Diagnostic Mammogram 12/01/17 IMPRESSION 1. New palpable lump in the upper outer right breast corresponds to a large 5 cm mass in the  right axillary tail concerning for worsening conglomerate adenopathy vs spread of patient's known malignancy 2. In addition, increased size of previously biopsied lobular carcinoma in the medial lower right breast with new skin thickening and nipple retraction concerning for new skin involvement. 3. Increased size and number of indeterminate clustered calcifications in the left upper outer breast middle depth.       12/14/2017 Imaging    CT CAP w contrast 12/14/17  IMPRESSION: large mass within the right breast. There is parenchymal and trabecular thickening within the right breast as well as skin thickening overlying the right breast. Findings are most compatible with right breast carcinoma, potentially inflammatory carcinoma given the skin thickening. There is abnormal soft tissue within the right axilla concerning for right axillary adenopathy. Bilateral pulmonary nodules concerning for metastatic disease. Left first and right lateral sixth rib lytic lesions with associated fractures compatible with osseous metastatic disease. Patchy sclerosis within the right ilium concerning for osseous metastatic disease. Indeterminate lobular low attenuation within the stomach. Recommend further evaluation with direct visualization to exclude the possibility of gastric mass. Indeterminate low-attenuation lesions within the right kidney, likely represent cysts with elevated internal attenuation due to quantum mottle artifact. Recommend attention on follow-up exam.      12/14/2017 Imaging    Whole body bone scan 12/14/17 IMPRESSION: uptake at the lateral LEFT first and lateral RIGHT sixth ribs corresponding to lytic lesions with pathologic fractures by CT. Multiple sites of degenerative type uptake as  above. Uptake surrounding RIGHT hip and BILATERAL knee prostheses, nonspecific, could be related to surgery if recent, at delayed intervals more suggestive of aseptic loosening or infection.         HISTORY OF  PRESENTING ILLNESS (03/20/2015):  Peggy Welch 77 y.o. female is here because of recently you diagnosed right breast cancer.  She felt a lump in the right breast about 2 months ago, nontender, no skin or nipple change. She was seen by her primary care physician, mammogram showed a right breast mass. She underwent a core needle biopsy of the right breast mass on 02/27/2015, which revealed invasive lobular carcinoma, ER positive, PR negative, HER-2 negative. She was seen by Dr. Ninfa Linden, and was referred to [place] to discuss treatment options.  She has moderate arthritis, had a bilateral knee replacement, and recently right hip replacement in April. She has mild to moderate arthritis pain, but remains fairly active. She lives with her granddaughter, completely independent, goes out with her sister for shopping, etc she denies any other significant pain, no dyspnea, no GI symptoms. She has good appetite and eats well. No recent weight loss.  CURRENT THERAPY: pending first line anastrozole and Ibrance   INTERIM HISTORY Peggy Welch returns to the clinic for follow-up. Pt presents to the office today accompanied by her daughter, sister, and niece. She reports that she is doing well overall. She has not seen her surgeon, Dr. Ninfa Linden recently.   Of note since the patient last visit, she has had a whole body bone scan completed on 12/14/17 with results revealing uptake at the lateral LEFT first and lateral RIGHT sixth ribs corresponding to lytic lesions with pathologic fractures by CT. Multiple sites of degenerative type uptake as above. Uptake surrounding RIGHT hip and BILATERAL knee prostheses, nonspecific, could be related to surgery if recent, at delayed intervals more suggestive of aseptic loosening or infection.  She has also had a CT CAP w contrast completed on 12/14/17 with results revealing large mass within the right breast. There is parenchymal and trabecular thickening within the right breast as  well as skin thickening overlying the right breast. Findings are most compatible with right breast carcinoma, potentially inflammatory carcinoma given the skin thickening. There is abnormal soft tissue within the right axilla concerning for right axillary adenopathy. Bilateral pulmonary nodules concerning for metastatic disease. Left first and right lateral sixth rib lytic lesions with associated fractures compatible with osseous metastatic disease. Patchy sclerosis within the right ilium concerning for osseous metastatic disease. Indeterminate lobular low attenuation within the stomach. Recommend further evaluation with direct visualization to exclude the possibility of gastric mass. Indeterminate low-attenuation lesions within the right kidney, likely represent cysts with elevated internal attenuation due to quantum mottle artifact. Recommend attention on follow-up exam.   On review of systems, pt reports dry skin to her right breast. She denies breast pain, rib pain, and any other symptoms.      MEDICAL HISTORY:  Past Medical History:  Diagnosis Date  . Arthritis   . Asthma   . Asthma exacerbation 10/14/2011  . Blood transfusion without reported diagnosis   . Hepatitis C    from blood transfusion  . Hypertension   . Pneumonia 4/02016  . rt breast ca dx'd 2019    SURGICAL HISTORY: Past Surgical History:  Procedure Laterality Date  . CHOLECYSTECTOMY  02/21/2002  . JOINT REPLACEMENT     Bilateral total knee replacements  . knee replacements Bilateral   . Right Rotator Cuff Repair  08/12/2006  . TOTAL HIP ARTHROPLASTY Right 11/22/2014   dr Lorin Mercy  . TOTAL HIP ARTHROPLASTY Right 11/22/2014   Procedure: TOTAL HIP ARTHROPLASTY ANTERIOR APPROACH;  Surgeon: Marybelle Killings, MD;  Location: [place];  Service: Orthopedics;  Laterality: Right;    SOCIAL HISTORY: Social History   Socioeconomic History  . Marital status: Legally Separated    Spouse name: Not on file  . Number of children: Not on  file  . Years of education: Not on file  . Highest education level: Not on file  Occupational History  . Not on file  Social Needs  . Financial resource strain: Not on file  . Food insecurity:    Worry: Not on file    Inability: Not on file  . Transportation needs:    Medical: Not on file    Non-medical: Not on file  Tobacco Use  . Smoking status: Never Smoker  . Smokeless tobacco: Never Used  Substance and Sexual Activity  . Alcohol use: No  . Drug use: No  . Sexual activity: Never  Lifestyle  . Physical activity:    Days per week: Not on file    Minutes per session: Not on file  . Stress: Not on file  Relationships  . Social connections:    Talks on phone: Not on file    Gets together: Not on file    Attends religious service: Not on file    Active member of club or organization: Not on file    Attends meetings of clubs or organizations: Not on file    Relationship status: Not on file  . Intimate partner violence:    Fear of current or ex partner: Not on file    Emotionally abused: Not on file    Physically abused: Not on file    Forced sexual activity: Not on file  Other Topics Concern  . Not on file  Social History Narrative  . Not on file    FAMILY HISTORY: Family History  Problem Relation Age of Onset  . Hypertension Sister     ALLERGIES:  is allergic to shrimp [shellfish allergy].  MEDICATIONS:  Current Outpatient Medications  Medication Sig Dispense Refill  . acetaminophen (TYLENOL) 650 MG CR tablet Take 650 mg by mouth every 8 (eight) hours as needed for pain (pain).    Marland Kitchen albuterol (PROVENTIL) (2.5 MG/3ML) 0.083% nebulizer solution Take 2.5 mg by nebulization every 4 (four) hours as needed for wheezing or shortness of breath (wheezing).    Marland Kitchen alendronate (FOSAMAX) 70 MG tablet Take 70 mg by mouth every 7 (seven) days. On Tuesdays. Take with a full glass of water on an empty stomach.    Marland Kitchen aspirin 81 MG tablet Take 81 mg by mouth daily.    . B  Complex-C (B-COMPLEX WITH VITAMIN C) tablet Take 1 tablet by mouth daily.    . hydrochlorothiazide (HYDRODIURIL) 25 MG tablet Take 25 mg by mouth daily.    . naproxen (NAPROSYN) 250 MG tablet Take 1 tablet (250 mg total) by mouth 2 (two) times daily. 16 tablet 0  . potassium chloride SA (K-DUR,KLOR-CON) 20 MEQ tablet Take 20 mEq by mouth daily.  0  . anastrozole (ARIMIDEX) 1 MG tablet Take 1 tablet (1 mg total) by mouth daily. 30 tablet 3  . palbociclib (IBRANCE) 125 MG capsule Take 1 capsule (125 mg total) by mouth daily with breakfast. Take whole with food. Take for 21 days on, 7 days off, repeat every 28 days.  21 capsule 0  . Potassium Chloride ER 20 MEQ TBCR TK 1 T PO D  0   No current facility-administered medications for this visit.     REVIEW OF SYSTEMS:    Constitutional: Denies fevers, chills or abnormal night sweats Eyes: Denies blurriness of vision, double vision or watery eyes Ears, nose, mouth, throat, and face: Denies mucositis or sore throat Respiratory: Denies cough, dyspnea or wheezes Cardiovascular: Denies palpitation, chest discomfort or lower extremity swelling Gastrointestinal:  Denies nausea, heartburn or change in bowel habits Skin: Denies abnormal skin rashes Lymphatics: Denies new lymphadenopathy or easy bruising Neurological:Denies numbness, tingling or new weaknesses (+) dementia Behavioral/Psych: Mood is stable, no new changes  Breast: (+) right breast swelling and skin change All other systems were reviewed with the patient and are negative.  PHYSICAL EXAMINATION:  ECOG PERFORMANCE STATUS: 1 - Symptomatic but completely ambulatory  Vitals:   12/22/17 1341  BP: (!) 152/56  Pulse: 70  Resp: 18  Temp: 98.6 F (37 C)  SpO2: 100%   Filed Weights   12/22/17 1341  Weight: 185 lb (83.9 kg)    GENERAL:alert, no distress and comfortable SKIN: skin color, texture, turgor are normal, no rashes or significant lesions EYES: normal, conjunctiva are pink and  non-injected, sclera clear OROPHARYNX:no exudate, no erythema and lips, buccal mucosa, and tongue normal  NECK: supple, thyroid normal size, non-tender, without nodularity LYMPH:  no palpable lymphadenopathy in the cervical, axillary or inguinal LUNGS: clear to auscultation and percussion with normal breathing effort HEART: regular rate & rhythm (+) 3/6 systolic murmur  ABDOMEN:abdomen soft, non-tender and normal bowel sounds. NO organomegaly  Musculoskeletal:no cyanosis of digits and no clubbing  PSYCH: alert & oriented x 3 with fluent speech NEURO: no focal motor/sensory deficits Breasts: Her right breast is deformed with nipple retraction towards left inner quadrant. There is a 5 x 5.5 cm mass in the inner lower quadrant of right breast involving the nipple and areolar area with small opening. No discharge or bleeding. There is also 5 x 3.5 cm in the right upper outer quadrant. There is a 3.5 cm fixed, mass in the right axillary region.  LABORATORY DATA:  I have reviewed the data as listed Lab Results  Component Value Date   WBC 7.2 12/04/2017   HGB 11.5 (L) 12/04/2017   HCT 36.0 12/04/2017   MCV 94.5 12/04/2017   PLT 334 12/04/2017   Recent Labs    12/04/17 1532  NA 142  K 3.7  CL 104  CO2 29  GLUCOSE 88  BUN 11  CREATININE 0.82  CALCIUM 9.4  GFRNONAA >60  GFRAA >60  PROT 7.6  ALBUMIN 3.7  AST 29  ALT 17  ALKPHOS 64  BILITOT 0.6    PATHOLOGY REPORT Diagnosis 02/27/2015  1. Breast, right, needle core biopsy - INVASIVE LOBULAR CARCINOMA. - LOBULAR CARCINOMA IN SITU. - SEE COMMENT. 2. Lymph node, needle/core biopsy, right axilla - ONE LYMPH NODE POSITIVE FOR METASTATIC LOBULAR CARCINOMA (1/1). - SEE COMMENT. Microscopic Comment 1. An E-cadherin immunohistochemical stain is performed on the right needle core biopsy. The stain is negative in both the in situ and invasive components confirming the above diagnosis. Although definitive grading of breast carcinoma is  best done on excision, the features of the invasive tumor from the right needle core biopsy are compatible with a grade 1-2 breast carcinoma. Breast prognostic markers will be performed and reported in an addendum. Findings are called to [facility] on 03/01/2015.  Dr. Lyndon Code has seen the first specimen in consultation with agreement. 2. A cytokeratin AE1/AE3 immunohistochemical stain is performed on the second specimen (right axillary lymph node biopsy). The stain highlights scattered tumor cells confirming the above diagnosis. Dr. Lyndon Code has seen the second specimen in consultation with agreement. A breast prognostic profile will also be performed on the right axillary lymph node and reported in an addendum to follow. (RH:ecj 03/01/2015)  1. PROGNOSTIC INDICATORS - ACIS (BLOCK 1A) Results: IMMUNOHISTOCHEMICAL AND MORPHOMETRIC ANALYSIS BY THE AUTOMATED CELLULAR IMAGING SYSTEM (ACIS) Estrogen Receptor: 95%, POSITIVE, STRONG STAINING INTENSITY (PERFORMED MANUALLY) Progesterone Receptor: 0%, NEGATIVE, (PERFORMED MANUALLY) Proliferation Marker Ki67: 5% (PERFORMED MANUALLY) COMMENT: The negative hormone receptor study(ies) in this case has an internal positive control. Results: HER2 - NEGATIVE RATIO OF HER2/CEP17 SIGNALS 1.28 AVERAGE HER2 COPY NUMBER PER CELL 2.30  2. PROGNOSTIC INDICATORS - ACIS (BLOCK 2A) Results: IMMUNOHISTOCHEMICAL AND MORPHOMETRIC ANALYSIS BY THE AUTOMATED CELLULAR IMAGING SYSTEM (ACIS) Estrogen Receptor: 95%, POSITIVE, STRONG STAINING INTENSITY (PERFORMED MANUALLY) Progesterone Receptor: 0%, NEGATIVE, (PERFORMED MANUALLY) Proliferation Marker Ki67: 5% (PERFORMED MANUALLY) COMMENT: The negative hormone receptor study(ies) in this case has no internal positive control. 2. FLUORESCENCE IN-SITU HYBRIDIZATION Results: HER2 - NEGATIVE RATIO OF HER2/CEP17 SIGNALS 1.10 AVERAGE HER2 COPY NUMBER PER CELL 2.20  RADIOGRAPHIC STUDIES: I have personally reviewed the  radiological images as listed and agreed with the findings in the report.  Breast US 12/01/17 IMPRESSION 1. Worsening invasive lobular carcinoma of the right breast with worsening metastatic axillary adenopathy. In addition. Physical exam findings consistent with skin invasion, and concerning for possible invasion of the sternum.  2. The indeterminate left breast calcifications were discussed with the patient and her sister. Given the extent of the disease in the right breast, and possibility of advanced metastatic disease, further work-up of these calcifications will be deferred until after the patient is seen by Dr. Burr Medico. These could be biopsied in the future if it would be helpful for treatment of patient's disease.   Diagnostic Mammogram 12/01/17 IMPRESSION 1. New palpable lump in the upper outer right breast corresponds to a large 5 cm mass in the right axillary tail concerning for worsening conglomerate adenopathy vs spread of patient's known malignancy 2. In addition, increased size of previously biopsied lobular carcinoma in the medial lower right breast with new skin thickening and nipple retraction concerning for new skin involvement. 3. Increased size and number of indeterminate clustered calcifications in the left upper outer breast middle depth.   Mr Breast Bilateral W Wo Contrast  03/08/2015    IMPRESSION: 1. Spiculated biopsy proven malignancy in the lower inner right breast measures 2.1 x 2.5 x 2.3 cm.  2. 6 mm oval mass in the slightly outer right breast at the approximate 9 o'clock position.  3.  Oval enhancing nodules (5-6 mm) in the upper-outer right breast.  4. Enlarged lymph node in the upper-outer right breast compatible with biopsy proven nodal metastases.  5. No MRI evidence of malignancy in the left breast.   RECOMMENDATION: 1. Second-look ultrasound with biopsy for the 6 mm oval mass in the slightly outer right breast at 9 o'clock.  2. Second-look ultrasound with biopsy for one  of the enhancing nodules seen in the upper-outer right breast.  If these nodules cannot be seen via ultrasound, then MRI guided biopsy is recommended if considering breast conservation.  BI-RADS CATEGORY  4: Suspicious.   Electronically Signed   By: Everlean Alstrom M.D.   On: 03/08/2015 12:41  ASSESSMENT & PLAN:  77 y.o.  African-American female, with past medical history of osteoarthritis, status post multiple orthopedic surgeries, but is still completely independent and active.  1. 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 I previously reviewed the natural history of her breast cancer, and the standard treatment option with surgery, which is the only way to cure the cancer. The consequence of a untreated breast cancer, which is likely going to metastasize and becomes incurable in the future, was discussed with her and her sister in great detail. She voiced good understanding -She declined any breast cancer treatment at her diagnosis and did not follow up.  -Pt presented back to the clinic on 12/04/17 with obvious clinical and radiographic cancer progression. Her mammogram and [place] on 12/01/17 revealed larger inner lower quadrant breast tumor with skin invasion, and possible sternum invasion on mammogram.  She also has a new palpable mass in the right upper quadrant of right breast, and bulky axillary adenopathy. -I previously had a long conversation about nature history of untreated breast cancer and consequence, which include but not limited to open wound, bleeding, pain, bone pain from metastasis, and eventually taking her life  --She had a whole bone scan on 12/14/2017 which showed: Uptake at the lateral LEFT first and lateral RIGHT sixth ribs corresponding to lytic lesions with pathologic fractures by CT. Multiple sites of degenerative type uptake as above.  -CT  CAP w contrast on 12/14/17 showed: Large mass within the right breast. There is parenchymal and trabecular thickening within the right breast as well as skin thickening overlying the right breast. Findings are most compatible with right breast carcinoma, potentially inflammatory carcinoma given the skin thickening. There is abnormal soft tissue within the right axilla concerning for right axillary adenopathy. Bilateral pulmonary nodules concerning for metastatic disease. Left first and right lateral sixth rib lytic lesions with associated fractures compatible with osseous metastatic disease.  -I have reviewed the image with patient and her family members in person, I agree with the radiologist interpretations.  She has clearly had cancer progression in the right breast and axillary node, and probable lung and bone metastasis.  Due to the difficulty of a biopsy of this metastatic disease, and her unwillingness to go through procedures, I will start her treatment empirically for metastatic disease. -Given her initial strongly ER positive, HER2-  disease, I recommend first-line anastrozole 1 mg daily and Ibrance 125 mg daily, 3 weeks on 1 week of. --The potential benefit and side effects, which includes but not limited to, hot flash, skin and vaginal dryness, metabolic changes ( increased blood glucose, cholesterol, weight, etc.), slightly in increased risk of cardiovascular disease, cataracts, muscular and joint discomfort, osteopenia and osteoporosis, pancytopenia, especially neutropenia and risk of infection, fatigue and diarrhea, etc, were discussed with her in great details. She is interested, and agrees to proceed. -Discussed use of surgery for aid in palliative treatment of patient cancer diagnosis, to which the patient declined at this time.  -I called the anastrozole to her local pharmacy, she will start this week.  [name] will be sent to her specialty pharmacy, she will start as soon as she receives  it. -Lab and f/u in 4 weeks   2. Osteoporosis  -She takes Fosamax, managed by her PCP Dr. Criss Rosales   3. Goal of care discussion  -We again discussed the incurable nature of her cancer,  and the overall poor prognosis, especially if she does not have good response to chemotherapy or progress on chemo -The patient understands the goal of care is palliative.  We also discussed her breast cancer is very treatable, her life expectancy would be several years  -she is full code for now    Plan -She will start anastrozole 1 mg daily this week  -I sent Ibrance 125 mg daily, 3 weeks on, one-week off, 2-hour oral pharmacy today.  She will start as soon as she receives the medication  -lab and f/u in 4 weeks   All questions were answered. The patient knows to call the clinic with any problems, questions or concerns. I spent 30 minutes counseling the patient face to face. The total time spent in the appointment was 40 minutes and more than 50% was on counseling.  This document serves as a record of services personally performed by Truitt Merle, MD. It was created on her behalf by Steva Colder, a trained medical scribe. The creation of this record is based on the scribe's personal observations and the provider's statements to them.   I have reviewed the above documentation for accuracy and completeness, and I agree with the above.     Truitt Merle, MD 12/22/2017 3:41 PM

## 2017-12-22 NOTE — Telephone Encounter (Signed)
Printed avs and calender of upcoming appointment. Per 4/30 los 

## 2017-12-23 ENCOUNTER — Telehealth: Payer: Self-pay | Admitting: Pharmacist

## 2017-12-23 ENCOUNTER — Telehealth: Payer: Self-pay | Admitting: Pharmacy Technician

## 2017-12-23 DIAGNOSIS — Z170 Estrogen receptor positive status [ER+]: Principal | ICD-10-CM

## 2017-12-23 DIAGNOSIS — C50311 Malignant neoplasm of lower-inner quadrant of right female breast: Secondary | ICD-10-CM

## 2017-12-23 MED ORDER — PALBOCICLIB 125 MG PO CAPS: 125.0000 mg | ORAL_CAPSULE | Freq: Every day | ORAL | 0 refills | Status: DC

## 2017-12-23 NOTE — Telephone Encounter (Signed)
Oral Oncology Pharmacist Encounter  Received new prescription for Ibrance (palbociclib) for the treatment of metastatic, presumed hormone receptor positive breast cancer in conjunction with anastrozole, planned duration until disease progression or unacceptable toxicities.  Labs from 12/22/2017 assessed, okay for treatment.  Current medication list in Epic reviewed, no DDIs with Ibrance identified  Prescription has been e-scribed to the [employer] for benefits analysis and approval. Insurance authorization has been approved Test claim at the pharmacy revealed copayment $3.36  Patient my office follow-up on 01/21/2018. Patient will need CBC check on C1 D14, discussed with MD. Start date will be discussed with patient and follow-up will be scheduled appropriately.  Oral Oncology Clinic will continue to follow for initial counseling and start date.  Johny Drilling, PharmD, BCPS, BCOP 12/23/2017 11:30 AM Oral Oncology Clinic [phone]

## 2017-12-23 NOTE — Telephone Encounter (Signed)
Oral Chemotherapy Pharmacist Encounter   I spoke with patient for overview of: Ibrance.   Counseled patient on administration, dosing, side effects, monitoring, drug-food interactions, safe handling, storage, and disposal.  Patient will take Ibrance 125mg  capsules, 1 capsule by mouth once daily with breakfast, for 3 weeks on, 1 week off.  Patient knows to avoid grapefruit and grapefruit juice.  Patient will take anastrazole once daily starting 12/24/17.  Peggy Welch start date: 01/06/18 It is patient's preference to defer her Peggy Welch start date until the middle of May so that follow-up (which is already scheduled on 01/21/2018) will be approximately C1 D14 of cycle 1. Patient states she has transportation issues, and is not able to get to the office prior to that.  Side effects of Ibrance include but are not limited to: fatigue, hair loss, GI upset, nausea, decreased blood counts, and increased upper respiratory infections.  Reviewed with patient importance of keeping a medication schedule and plan for any missed doses.  Peggy Welch voiced understanding and appreciation.   All questions answered. Medication reconciliation performed and medication/allergy list updated.  Patient has already picked up 90-day supply of anastrozole from local pharmacy. Ibrance prescription will be made ready at the [facility] for patient to pick up on Monday (12/28/2017). Patient knows to pick up her Peggy Welch prior to planned start date.  Copayment for 28-day supply of Ibrance $3.36 Patient is unable to perform this copayment at this time, and will have to wait until department metabolic this Friday prior to being able to look for more Ibrance medication.  Patient declined several offers for the office to assist patient in securing copayment foundation grants to reduce out-of-pocket expense to $0. Patient stated she would just rather wait until she can afford the medication herself to pick it  up.  Patient knows to call the oftfice with questions or concerns. Oral Oncology Clinic will continue to follow.  Thank you,  Johny Drilling, PharmD, BCPS, BCOP 12/23/2017   12:11 PM Oral Oncology Clinic [phone]

## 2017-12-23 NOTE — Telephone Encounter (Signed)
Oral Oncology Patient Advocate Encounter  Received notification from [facility] that prior authorization for [name] is required.  PA submitted on CoverMyMeds Key [id number] Status is pending  Oral Oncology Clinic will continue to follow.  [name]. [name], [place] Patient [place] [phone] 12/23/2017 9:17 AM

## 2017-12-23 NOTE — Telephone Encounter (Signed)
Oral Oncology Patient Advocate Encounter  Prior Authorization for Peggy Welch has been approved.    PA# [id number]  Effective dates: 12/23/2017 through 08/24/2018  Oral Oncology Clinic will continue to follow.   [name]. [name], [place] Patient [place] [phone] 12/23/2017 9:58 AM

## 2018-01-15 DIAGNOSIS — C50112 Malignant neoplasm of central portion of left female breast: Secondary | ICD-10-CM | POA: Diagnosis not present

## 2018-01-15 DIAGNOSIS — I10 Essential (primary) hypertension: Secondary | ICD-10-CM | POA: Diagnosis not present

## 2018-01-15 MED FILL — IBRANCE 125 MG CAPSULE: 125 | 28 days supply | Qty: 21 | Fill #0

## 2018-01-15 NOTE — Telephone Encounter (Signed)
Oral Oncology Patient Advocate Encounter  Received notification from [facility] that the patient had not picked up her initial fill of Ibrance as previously arranged.   I was able to reach the patient and arrange for the pharmacy to ship the medication to her home.  It will ship today 01/15/2018 and arrive to her home no later than Tuesday 01/19/2018.   [name]. [name], [name] Peggy Welch [phone] 01/15/2018 10:50 AM

## 2018-01-16 DIAGNOSIS — J449 Chronic obstructive pulmonary disease, unspecified: Secondary | ICD-10-CM | POA: Diagnosis not present

## 2018-01-20 NOTE — Progress Notes (Signed)
[facility]  [phone] [phone]  Clinic Follow Up Note   Patient Care Team: Peggy Lei, MD as PCP - General (Family Medicine) Peggy Germany, RN as Registered Nurse Peggy Kaufmann, RN as Registered Nurse Peggy Rudd, MD as Consulting Physician (Radiation Oncology) Peggy Keens, MD as Consulting Physician (General Surgery)   Date of Service:  01/21/2018  CHIEF COMPLAINTS:  Follow up right breast cancer.    Breast cancer of lower-inner quadrant of right female breast ([place])   02/27/2015 Receptors her2    ER 95%+, PR-, HER2 (-), KI67 5%       02/27/2015 Initial Biopsy    right breast mass and axillary node biopsy showed invasive lobular carcinoma, (+) LCIS       03/05/2015 Initial Diagnosis    Breast cancer of lower-inner quadrant of right female breast      03/08/2015 Imaging    Breast MRI showed:  Spiculated biopsy proven malignancy in the lower inner R breast  2.1 x 2.5 x 2.3 cm,  6 mm oval mass in the R breast 9 o'clock position, Oval ehancing nodules (5-6 mm) in the upper-outer R breast, and known enlarged LN, left breast (-)      12/01/2017 Imaging    Breast US 12/01/17 IMPRESSION 1. Worsening invasive lobular carcinoma of the right breast with worsening metastatic axillary adenopathy. In addition. Physical exam findings consistent with skin invasion, and concerning for possible invasion of the sternum.  2. The indeterminate left breast calcifications were discussed with the patient and her sister. Given the extent of the disease in the right breast, and possibility of advanced metastatic disease, further work-up of these calcifications will be deferred until after the patient is seen by Dr. Burr Welch. These could be biopsied in the future if it would be helpful for treatment of patient's disease.       12/01/2017 Imaging    Diagnostic Mammogram 12/01/17 IMPRESSION 1. New palpable lump in the upper outer right breast corresponds to a  large 5 cm mass in the right axillary tail concerning for worsening conglomerate adenopathy vs spread of patient's known malignancy 2. In addition, increased size of previously biopsied lobular carcinoma in the medial lower right breast with new skin thickening and nipple retraction concerning for new skin involvement. 3. Increased size and number of indeterminate clustered calcifications in the left upper outer breast middle depth.       12/14/2017 Imaging    CT CAP w contrast 12/14/17  IMPRESSION: large mass within the right breast. There is parenchymal and trabecular thickening within the right breast as well as skin thickening overlying the right breast. Findings are most compatible with right breast carcinoma, potentially inflammatory carcinoma given the skin thickening. There is abnormal soft tissue within the right axilla concerning for right axillary adenopathy. Bilateral pulmonary nodules concerning for metastatic disease. Left first and right lateral sixth rib lytic lesions with associated fractures compatible with osseous metastatic disease. Patchy sclerosis within the right ilium concerning for osseous metastatic disease. Indeterminate lobular low attenuation within the stomach. Recommend further evaluation with direct visualization to exclude the possibility of gastric mass. Indeterminate low-attenuation lesions within the right kidney, likely represent cysts with elevated internal attenuation due to quantum mottle artifact. Recommend attention on follow-up exam.      12/14/2017 Imaging    Whole body bone scan 12/14/17 IMPRESSION: uptake at the lateral LEFT first and lateral RIGHT sixth ribs corresponding to lytic lesions with pathologic fractures by CT. Multiple  sites of degenerative type uptake as above. Uptake surrounding RIGHT hip and BILATERAL knee prostheses, nonspecific, could be related to surgery if recent, at delayed intervals more suggestive of aseptic loosening or infection.         12/23/2017 -  Anti-estrogen oral therapy    -first-line anastrozole 1 mg daily starting 12/23/17 -Ibrance 125 mg daily, 3 weeks on 1 week off starting 01/06/18         HISTORY OF PRESENTING ILLNESS (03/20/2015):  Peggy Welch 77 y.o. female is here because of recently you diagnosed right breast cancer.  She felt a lump in the right breast about 2 months ago, nontender, no skin or nipple change. She was seen by her primary care physician, mammogram showed a right breast mass. She underwent a core needle biopsy of the right breast mass on 02/27/2015, which revealed invasive lobular carcinoma, ER positive, PR negative, HER-2 negative. She was seen by Dr. Ninfa Welch, and was referred to [place] to discuss treatment options.  She has moderate arthritis, had a bilateral knee replacement, and recently right hip replacement in April. She has mild to moderate arthritis pain, but remains fairly active. She lives with her granddaughter, completely independent, goes out with her sister for shopping, etc she denies any other significant pain, no dyspnea, no GI symptoms. She has good appetite and eats well. No recent weight loss.  CURRENT THERAPY:  -first-line anastrozole 1 mg daily started 12/23/17 -Ibrance 125 mg daily, 1 week on and 1 week off starting 01/22/18  INTERIM HISTORY Peggy Welch returns to the clinic for follow-up. She presents to the office today accompanied by her family member. She notes she started her anastrozole. She has her Peggy Welch but has not started it. She notes she has hot flashes from anastrozole. She notes she will get nauseous at times as well. She is hard of hearing and does not have a hearing aid. She is concerned about side effects. She notes she just noticed her wound.    On review of symptoms, pt notes hot flashes and nausea. She denies cough or SOB.      MEDICAL HISTORY:  Past Medical History:  Diagnosis Date  . Arthritis   . Asthma   . Asthma exacerbation 10/14/2011   . Blood transfusion without reported diagnosis   . Hepatitis C    from blood transfusion  . Hypertension   . Pneumonia 4/02016  . rt breast ca dx'd 2019    SURGICAL HISTORY: Past Surgical History:  Procedure Laterality Date  . CHOLECYSTECTOMY  02/21/2002  . JOINT REPLACEMENT     Bilateral total knee replacements  . knee replacements Bilateral   . Right Rotator Cuff Repair  08/12/2006  . TOTAL HIP ARTHROPLASTY Right 11/22/2014   dr Lorin Mercy  . TOTAL HIP ARTHROPLASTY Right 11/22/2014   Procedure: TOTAL HIP ARTHROPLASTY ANTERIOR APPROACH;  Surgeon: Marybelle Killings, MD;  Location: [facility];  Service: Orthopedics;  Laterality: Right;    SOCIAL HISTORY: Social History   Socioeconomic History  . Marital status: Legally Separated    Spouse name: Not on file  . Number of children: Not on file  . Years of education: Not on file  . Highest education level: Not on file  Occupational History  . Not on file  Social Needs  . Financial resource strain: Not on file  . Food insecurity:    Worry: Not on file    Inability: Not on file  . Transportation needs:    Medical: Not  on file    Non-medical: Not on file  Tobacco Use  . Smoking status: Never Smoker  . Smokeless tobacco: Never Used  Substance and Sexual Activity  . Alcohol use: No  . Drug use: No  . Sexual activity: Never  Lifestyle  . Physical activity:    Days per week: Not on file    Minutes per session: Not on file  . Stress: Not on file  Relationships  . Social connections:    Talks on phone: Not on file    Gets together: Not on file    Attends religious service: Not on file    Active member of club or organization: Not on file    Attends meetings of clubs or organizations: Not on file    Relationship status: Not on file  . Intimate partner violence:    Fear of current or ex partner: Not on file    Emotionally abused: Not on file    Physically abused: Not on file    Forced sexual activity: Not on file  Other Topics  Concern  . Not on file  Social History Narrative  . Not on file    FAMILY HISTORY: Family History  Problem Relation Age of Onset  . Hypertension Sister     ALLERGIES:  is allergic to shrimp [shellfish allergy].  MEDICATIONS:  Current Outpatient Medications  Medication Sig Dispense Refill  . acetaminophen (TYLENOL) 650 MG CR tablet Take 650 mg by mouth every 8 (eight) hours as needed for pain (pain).    Marland Kitchen albuterol (PROVENTIL) (2.5 MG/3ML) 0.083% nebulizer solution Take 2.5 mg by nebulization every 4 (four) hours as needed for wheezing or shortness of breath (wheezing).    Marland Kitchen alendronate (FOSAMAX) 70 MG tablet Take 70 mg by mouth every 7 (seven) days. On Tuesdays. Take with a full glass of water on an empty stomach.    Marland Kitchen anastrozole (ARIMIDEX) 1 MG tablet Take 1 tablet (1 mg total) by mouth daily. 30 tablet 3  . aspirin 81 MG tablet Take 81 mg by mouth daily.    . B Complex-C (B-COMPLEX WITH VITAMIN C) tablet Take 1 tablet by mouth daily.    . hydrochlorothiazide (HYDRODIURIL) 25 MG tablet Take 25 mg by mouth daily.    . naproxen (NAPROSYN) 250 MG tablet Take 1 tablet (250 mg total) by mouth 2 (two) times daily. 16 tablet 0  . palbociclib (IBRANCE) 125 MG capsule Take 1 capsule (125 mg total) by mouth daily with breakfast. Take whole with food. Take for 21 days on, 7 days off, repeat every 28 days. 21 capsule 0  . Potassium Chloride ER 20 MEQ TBCR TK 1 T PO D  0  . potassium chloride SA (K-DUR,KLOR-CON) 20 MEQ tablet Take 20 mEq by mouth daily.  0   No current facility-administered medications for this visit.     REVIEW OF SYSTEMS:    Constitutional: Denies fevers, chills or abnormal night sweats (+) hot flashes  Eyes: Denies blurriness of vision, double vision or watery eyes Ears, nose, mouth, throat, and face: Denies mucositis or sore throat (+) Hard of hearing  Respiratory: Denies cough, dyspnea or wheezes Cardiovascular: Denies palpitation, chest discomfort or lower extremity  swelling Gastrointestinal:  Denies nausea, heartburn or change in bowel habits Skin: Denies abnormal skin rashes Lymphatics: Denies new lymphadenopathy or easy bruising Neurological:Denies numbness, tingling or new weaknesses (+) dementia Behavioral/Psych: Mood is stable, no new changes  Breast: (+) right breast swelling and skin change All other  systems were reviewed with the patient and are negative.  PHYSICAL EXAMINATION:  ECOG PERFORMANCE STATUS: 1 - Symptomatic but completely ambulatory  Vitals:   01/21/18 1002  BP: (!) 144/75  Pulse: 74  Resp: 18  Temp: 98.3 F (36.8 C)  SpO2: 100%   Filed Weights   01/21/18 1002  Weight: 184 lb 11.2 oz (83.8 kg)   GENERAL:alert, no distress and comfortable SKIN: skin color, texture, turgor are normal, no rashes or significant lesions EYES: normal, conjunctiva are pink and non-injected, sclera clear OROPHARYNX:no exudate, no erythema and lips, buccal mucosa, and tongue normal  NECK: supple, thyroid normal size, non-tender, without nodularity LYMPH:  no palpable lymphadenopathy in the cervical, axillary or inguinal LUNGS: clear to auscultation and percussion with normal breathing effort HEART: regular rate & rhythm (+) 3/6 systolic murmur  ABDOMEN:abdomen soft, non-tender and normal bowel sounds. NO organomegaly  Musculoskeletal:no cyanosis of digits and no clubbing  PSYCH: alert & oriented x 3 with fluent speech NEURO: no focal motor/sensory deficits Breasts: Her right breast is deformed with nipple retraction towards left inner quadrant. There is a 5 x 5.5 cm mass in the inner lower quadrant of right breast involving the nipple and areolar area, now with small open in inner lower quadrant of right breast, without bleeding and discharge.  There is also 5 x 3.5 cm in the right upper outer quadrant. There is a 3.5 cm fixed, mass in the right axillary region.  See picture below            LABORATORY DATA:  I have reviewed the  data as listed Lab Results  Component Value Date   WBC 5.2 01/21/2018   HGB 11.4 (L) 01/21/2018   HCT 34.8 01/21/2018   MCV 92.6 01/21/2018   PLT 280 01/21/2018   Recent Labs    12/04/17 1532 01/21/18 0948  NA 142 140  K 3.7 3.6  CL 104 102  CO2 29 30*  GLUCOSE 88 88  BUN 11 13  CREATININE 0.82 0.82  CALCIUM 9.4 9.3  GFRNONAA >60 >60  GFRAA >60 >60  PROT 7.6 7.3  ALBUMIN 3.7 3.7  AST 29 29  ALT 17 16  ALKPHOS 64 52  BILITOT 0.6 0.5    PATHOLOGY REPORT Diagnosis 02/27/2015  1. Breast, right, needle core biopsy - INVASIVE LOBULAR CARCINOMA. - LOBULAR CARCINOMA IN SITU. - SEE COMMENT. 2. Lymph node, needle/core biopsy, right axilla - ONE LYMPH NODE POSITIVE FOR METASTATIC LOBULAR CARCINOMA (1/1). - SEE COMMENT. Microscopic Comment 1. An E-cadherin immunohistochemical stain is performed on the right needle core biopsy. The stain is negative in both the in situ and invasive components confirming the above diagnosis. Although definitive grading of breast carcinoma is best done on excision, the features of the invasive tumor from the right needle core biopsy are compatible with a grade 1-2 breast carcinoma. Breast prognostic markers will be performed and reported in an addendum. Findings are called to [facility] on 03/01/2015. Dr. Lyndon Code has seen the first specimen in consultation with agreement. 2. A cytokeratin AE1/AE3 immunohistochemical stain is performed on the second specimen (right axillary lymph node biopsy). The stain highlights scattered tumor cells confirming the above diagnosis. Dr. Lyndon Code has seen the second specimen in consultation with agreement. A breast prognostic profile will also be performed on the right axillary lymph node and reported in an addendum to follow. (RH:ecj 03/01/2015)  1. PROGNOSTIC INDICATORS - ACIS (BLOCK 1A) Results: IMMUNOHISTOCHEMICAL AND MORPHOMETRIC ANALYSIS BY THE AUTOMATED  CELLULAR IMAGING SYSTEM (ACIS) Estrogen Receptor:  95%, POSITIVE, STRONG STAINING INTENSITY (PERFORMED MANUALLY) Progesterone Receptor: 0%, NEGATIVE, (PERFORMED MANUALLY) Proliferation Marker Ki67: 5% (PERFORMED MANUALLY) COMMENT: The negative hormone receptor study(ies) in this case has an internal positive control. Results: HER2 - NEGATIVE RATIO OF HER2/CEP17 SIGNALS 1.28 AVERAGE HER2 COPY NUMBER PER CELL 2.30  2. PROGNOSTIC INDICATORS - ACIS (BLOCK 2A) Results: IMMUNOHISTOCHEMICAL AND MORPHOMETRIC ANALYSIS BY THE AUTOMATED CELLULAR IMAGING SYSTEM (ACIS) Estrogen Receptor: 95%, POSITIVE, STRONG STAINING INTENSITY (PERFORMED MANUALLY) Progesterone Receptor: 0%, NEGATIVE, (PERFORMED MANUALLY) Proliferation Marker Ki67: 5% (PERFORMED MANUALLY) COMMENT: The negative hormone receptor study(ies) in this case has no internal positive control. 2. FLUORESCENCE IN-SITU HYBRIDIZATION Results: HER2 - NEGATIVE RATIO OF HER2/CEP17 SIGNALS 1.10 AVERAGE HER2 COPY NUMBER PER CELL 2.20  RADIOGRAPHIC STUDIES: I have personally reviewed the radiological images as listed and agreed with the findings in the report.  Breast US 12/01/17 IMPRESSION 1. Worsening invasive lobular carcinoma of the right breast with worsening metastatic axillary adenopathy. In addition. Physical exam findings consistent with skin invasion, and concerning for possible invasion of the sternum.  2. The indeterminate left breast calcifications were discussed with the patient and her sister. Given the extent of the disease in the right breast, and possibility of advanced metastatic disease, further work-up of these calcifications will be deferred until after the patient is seen by Dr. Burr Welch. These could be biopsied in the future if it would be helpful for treatment of patient's disease.   Diagnostic Mammogram 12/01/17 IMPRESSION 1. New palpable lump in the upper outer right breast corresponds to a large 5 cm mass in the right axillary tail concerning for worsening conglomerate adenopathy  vs spread of patient's known malignancy 2. In addition, increased size of previously biopsied lobular carcinoma in the medial lower right breast with new skin thickening and nipple retraction concerning for new skin involvement. 3. Increased size and number of indeterminate clustered calcifications in the left upper outer breast middle depth.   Mr Breast Bilateral W Wo Contrast  03/08/2015    IMPRESSION: 1. Spiculated biopsy proven malignancy in the lower inner right breast measures 2.1 x 2.5 x 2.3 cm.  2. 6 mm oval mass in the slightly outer right breast at the approximate 9 o'clock position.  3.  Oval enhancing nodules (5-6 mm) in the upper-outer right breast.  4. Enlarged lymph node in the upper-outer right breast compatible with biopsy proven nodal metastases.  5. No MRI evidence of malignancy in the left breast.   RECOMMENDATION: 1. Second-look ultrasound with biopsy for the 6 mm oval mass in the slightly outer right breast at 9 o'clock.  2. Second-look ultrasound with biopsy for one of the enhancing nodules seen in the upper-outer right breast.  If these nodules cannot be seen via ultrasound, then MRI guided biopsy is recommended if considering breast conservation.  BI-RADS CATEGORY  4: Suspicious.   Electronically Signed   By: Everlean Alstrom M.D.   On: 03/08/2015 12:41    ASSESSMENT & PLAN:  77 y.o.  African-American female, with past medical history of osteoarthritis, status post multiple orthopedic surgeries, but is still completely independent and active.  1. 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 I previously reviewed the natural history of her breast cancer, and the standard treatment option with surgery, which is the only way to cure the cancer. The consequence of a untreated breast  cancer, which is likely going to metastasize and becomes  incurable in the future, was discussed with her and her sister in great detail. She voiced good understanding -She declined any breast cancer treatment at her initial diagnosis and did not follow up.  -Pt presented back to the clinic on 12/04/17 with obvious clinical and radiographic cancer progression. Her mammogram and [place] on 12/01/17 revealed larger inner lower quadrant breast tumor with skin invasion, and possible sternum invasion on mammogram.  She also has a new palpable mass in the right upper quadrant of right breast, and bulky axillary adenopathy. -Her staging scan showed bone and lung metastasis.  Given her advanced age, reluctant to do any procedure or treatment, I did not biopsy any metastatic site to confirm.  --She started Anastrozole on 12/23/17 and tolerating moderately with hot flash and occasional nausea. I recommend she take this after a meal. I offered medication to help her hot flashes, she declined at this time.  -She has failed Ibrance, but has not started it.  I again reviewed the potential side effects, and the benefit, and strongly encouraged her start.  She finally agrees to. -Upon 01/21/18 exam there is now a small open wound in the right breast, secondary to skin irritation from tumor. Will closely monitor.  -I discussed the option of palliative radiation to the right breast, if she is not willing to take medical treatment, or the wound gets worse. She declined radiation for now  -Labs reviewed and stable.  -I advised her to continue her anastrozole and start Ibrance tomorrow for 1 week on and 1 week off, for better tolerance. If she is not able to tolerate or does not want to continue with medication, I strongly recommend palliative radiation to help her wound and pain.  -Patient understands the treatment is palliative, to prolong her life, and hopefully improve her quality of life. -F/u in 2 weeks   2. Osteoporosis  -She takes Fosamax, managed by her PCP Dr. Criss Rosales   3. Goal of  care discussion  -We again discussed the incurable nature of her cancer, and the overall poor prognosis, especially if she does not have good response to chemotherapy or progress on chemo -The patient understands the goal of care is palliative.  We also discussed her breast cancer is very treatable, her life expectancy would be several years  -she is full code for now    Plan -Continue anastrozole 1 mg daily and start Ibrance 165m daily tomorrow for 1 week on and 1 week off.  -lab and f/u in 2 weeks   All questions were answered. The patient knows to call the clinic with any problems, questions or concerns. I spent 25 minutes counseling the patient face to face. The total time spent in the appointment was 30 minutes and more than 50% was on counseling.  IOneal Deputy am acting as scribe for YTruitt Merle MD.   I have reviewed the above documentation for accuracy and completeness, and I agree with the above.     YTruitt Merle MD 01/21/2018

## 2018-01-21 ENCOUNTER — Telehealth: Payer: Self-pay | Admitting: Hematology

## 2018-01-21 ENCOUNTER — Inpatient Hospital Stay: Payer: Medicare Other

## 2018-01-21 ENCOUNTER — Inpatient Hospital Stay: Payer: Medicare Other | Attending: Hematology | Admitting: Hematology

## 2018-01-21 ENCOUNTER — Other Ambulatory Visit: Payer: Self-pay | Admitting: Hematology

## 2018-01-21 ENCOUNTER — Encounter: Payer: Self-pay | Admitting: Hematology

## 2018-01-21 VITALS — BP 144/75 | HR 74 | Temp 98.3°F | Resp 18 | Ht 64.0 in | Wt 184.7 lb

## 2018-01-21 DIAGNOSIS — Z79811 Long term (current) use of aromatase inhibitors: Secondary | ICD-10-CM | POA: Diagnosis not present

## 2018-01-21 DIAGNOSIS — Z170 Estrogen receptor positive status [ER+]: Secondary | ICD-10-CM | POA: Diagnosis not present

## 2018-01-21 DIAGNOSIS — M810 Age-related osteoporosis without current pathological fracture: Secondary | ICD-10-CM | POA: Diagnosis not present

## 2018-01-21 DIAGNOSIS — N951 Menopausal and female climacteric states: Secondary | ICD-10-CM | POA: Diagnosis not present

## 2018-01-21 DIAGNOSIS — C7951 Secondary malignant neoplasm of bone: Secondary | ICD-10-CM | POA: Insufficient documentation

## 2018-01-21 DIAGNOSIS — R110 Nausea: Secondary | ICD-10-CM | POA: Insufficient documentation

## 2018-01-21 DIAGNOSIS — C50311 Malignant neoplasm of lower-inner quadrant of right female breast: Secondary | ICD-10-CM

## 2018-01-21 DIAGNOSIS — Z79899 Other long term (current) drug therapy: Secondary | ICD-10-CM | POA: Insufficient documentation

## 2018-01-21 DIAGNOSIS — Z7982 Long term (current) use of aspirin: Secondary | ICD-10-CM

## 2018-01-21 DIAGNOSIS — C7800 Secondary malignant neoplasm of unspecified lung: Secondary | ICD-10-CM

## 2018-01-21 LAB — CMP (CANCER CENTER ONLY)
ALT: 16 U/L (ref 0–55)
ANION GAP: 8 (ref 3–11)
AST: 29 U/L (ref 5–34)
Albumin: 3.7 g/dL (ref 3.5–5.0)
Alkaline Phosphatase: 52 U/L (ref 40–150)
BUN: 13 mg/dL (ref 7–26)
CALCIUM: 9.3 mg/dL (ref 8.4–10.4)
CHLORIDE: 102 mmol/L (ref 98–109)
CO2: 30 mmol/L — ABNORMAL HIGH (ref 22–29)
Creatinine: 0.82 mg/dL (ref 0.60–1.10)
Glucose, Bld: 88 mg/dL (ref 70–140)
Potassium: 3.6 mmol/L (ref 3.5–5.1)
Sodium: 140 mmol/L (ref 136–145)
Total Bilirubin: 0.5 mg/dL (ref 0.2–1.2)
Total Protein: 7.3 g/dL (ref 6.4–8.3)

## 2018-01-21 LAB — CBC WITH DIFFERENTIAL (CANCER CENTER ONLY)
BASOS PCT: 1 %
Basophils Absolute: 0 10*3/uL (ref 0.0–0.1)
Eosinophils Absolute: 0.2 10*3/uL (ref 0.0–0.5)
Eosinophils Relative: 4 %
HEMATOCRIT: 34.8 % (ref 34.8–46.6)
HEMOGLOBIN: 11.4 g/dL — AB (ref 11.6–15.9)
Lymphocytes Relative: 33 %
Lymphs Abs: 1.7 10*3/uL (ref 0.9–3.3)
MCH: 30.2 pg (ref 25.1–34.0)
MCHC: 32.7 g/dL (ref 31.5–36.0)
MCV: 92.6 fL (ref 79.5–101.0)
MONOS PCT: 9 %
Monocytes Absolute: 0.5 10*3/uL (ref 0.1–0.9)
NEUTROS ABS: 2.7 10*3/uL (ref 1.5–6.5)
NEUTROS PCT: 53 %
Platelet Count: 280 10*3/uL (ref 145–400)
RBC: 3.76 MIL/uL (ref 3.70–5.45)
RDW: 13.9 % (ref 11.2–14.5)
WBC: 5.2 10*3/uL (ref 3.9–10.3)

## 2018-01-21 NOTE — Telephone Encounter (Signed)
Appointments scheduled AVS/Calendar printed per 5/30 los °

## 2018-01-22 LAB — CANCER ANTIGEN 27.29: CA 27.29: 117.7 U/mL — ABNORMAL HIGH (ref 0.0–38.6)

## 2018-02-03 ENCOUNTER — Telehealth: Payer: Self-pay | Admitting: *Deleted

## 2018-02-03 ENCOUNTER — Telehealth: Payer: Self-pay

## 2018-02-03 NOTE — Telephone Encounter (Signed)
Per 6/12 phone msg return called and left a msg of verification of appt.

## 2018-02-03 NOTE — Telephone Encounter (Signed)
Addendum added to previous phone message.

## 2018-02-04 ENCOUNTER — Inpatient Hospital Stay: Payer: Medicare Other | Attending: Hematology

## 2018-02-04 ENCOUNTER — Encounter: Payer: Self-pay | Admitting: Nurse Practitioner

## 2018-02-04 ENCOUNTER — Inpatient Hospital Stay (HOSPITAL_BASED_OUTPATIENT_CLINIC_OR_DEPARTMENT_OTHER): Payer: Medicare Other | Admitting: Nurse Practitioner

## 2018-02-04 ENCOUNTER — Telehealth: Payer: Self-pay | Admitting: Nurse Practitioner

## 2018-02-04 VITALS — BP 131/85 | HR 76 | Temp 98.4°F | Resp 18 | Ht 64.0 in | Wt 183.8 lb

## 2018-02-04 DIAGNOSIS — J45901 Unspecified asthma with (acute) exacerbation: Secondary | ICD-10-CM | POA: Insufficient documentation

## 2018-02-04 DIAGNOSIS — C7951 Secondary malignant neoplasm of bone: Secondary | ICD-10-CM | POA: Diagnosis not present

## 2018-02-04 DIAGNOSIS — M810 Age-related osteoporosis without current pathological fracture: Secondary | ICD-10-CM

## 2018-02-04 DIAGNOSIS — D649 Anemia, unspecified: Secondary | ICD-10-CM

## 2018-02-04 DIAGNOSIS — Z170 Estrogen receptor positive status [ER+]: Secondary | ICD-10-CM | POA: Insufficient documentation

## 2018-02-04 DIAGNOSIS — Z79811 Long term (current) use of aromatase inhibitors: Secondary | ICD-10-CM

## 2018-02-04 DIAGNOSIS — Z79899 Other long term (current) drug therapy: Secondary | ICD-10-CM

## 2018-02-04 DIAGNOSIS — R530 Neoplastic (malignant) related fatigue: Secondary | ICD-10-CM | POA: Insufficient documentation

## 2018-02-04 DIAGNOSIS — C50311 Malignant neoplasm of lower-inner quadrant of right female breast: Secondary | ICD-10-CM

## 2018-02-04 DIAGNOSIS — Z7982 Long term (current) use of aspirin: Secondary | ICD-10-CM | POA: Diagnosis not present

## 2018-02-04 DIAGNOSIS — C7800 Secondary malignant neoplasm of unspecified lung: Secondary | ICD-10-CM | POA: Diagnosis not present

## 2018-02-04 DIAGNOSIS — R05 Cough: Secondary | ICD-10-CM | POA: Insufficient documentation

## 2018-02-04 LAB — CBC WITH DIFFERENTIAL (CANCER CENTER ONLY)
BASOS ABS: 0.1 10*3/uL (ref 0.0–0.1)
Basophils Relative: 1 %
EOS ABS: 0.3 10*3/uL (ref 0.0–0.5)
EOS PCT: 6 %
HCT: 34.2 % — ABNORMAL LOW (ref 34.8–46.6)
Hemoglobin: 11.2 g/dL — ABNORMAL LOW (ref 11.6–15.9)
LYMPHS PCT: 42 %
Lymphs Abs: 2.3 10*3/uL (ref 0.9–3.3)
MCH: 30.3 pg (ref 25.1–34.0)
MCHC: 32.7 g/dL (ref 31.5–36.0)
MCV: 92.5 fL (ref 79.5–101.0)
Monocytes Absolute: 0.6 10*3/uL (ref 0.1–0.9)
Monocytes Relative: 10 %
Neutro Abs: 2.2 10*3/uL (ref 1.5–6.5)
Neutrophils Relative %: 41 %
PLATELETS: 300 10*3/uL (ref 145–400)
RBC: 3.7 MIL/uL (ref 3.70–5.45)
RDW: 14 % (ref 11.2–14.5)
WBC: 5.4 10*3/uL (ref 3.9–10.3)

## 2018-02-04 LAB — CMP (CANCER CENTER ONLY)
ALT: 13 U/L (ref 0–55)
AST: 28 U/L (ref 5–34)
Albumin: 3.6 g/dL (ref 3.5–5.0)
Alkaline Phosphatase: 56 U/L (ref 40–150)
Anion gap: 8 (ref 3–11)
BUN: 13 mg/dL (ref 7–26)
CHLORIDE: 103 mmol/L (ref 98–109)
CO2: 28 mmol/L (ref 22–29)
Calcium: 9.1 mg/dL (ref 8.4–10.4)
Creatinine: 0.8 mg/dL (ref 0.60–1.10)
Glucose, Bld: 89 mg/dL (ref 70–140)
POTASSIUM: 3.8 mmol/L (ref 3.5–5.1)
SODIUM: 139 mmol/L (ref 136–145)
Total Bilirubin: 0.6 mg/dL (ref 0.2–1.2)
Total Protein: 6.9 g/dL (ref 6.4–8.3)

## 2018-02-04 NOTE — Progress Notes (Signed)
[place]  [phone] [phone]  Clinic Follow up Note   Patient Care Team: Peggy Lei, MD as PCP - General (Family Medicine) Peggy Germany, RN as Registered Nurse Peggy Kaufmann, RN as Registered Nurse Peggy Rudd, MD as Consulting Physician (Radiation Oncology) Peggy Keens, MD as Consulting Physician (General Surgery) 02/04/2018  SUMMARY OF ONCOLOGIC HISTORY:   Breast cancer of lower-inner quadrant of right female breast ([place])   02/27/2015 Receptors her2    ER 95%+, PR-, HER2 (-), KI67 5%       02/27/2015 Initial Biopsy    right breast mass and axillary node biopsy showed invasive lobular carcinoma, (+) LCIS       03/05/2015 Initial Diagnosis    Breast cancer of lower-inner quadrant of right female breast      03/08/2015 Imaging    Breast MRI showed:  Spiculated biopsy proven malignancy in the lower inner R breast  2.1 x 2.5 x 2.3 cm,  6 mm oval mass in the R breast 9 o'clock position, Oval ehancing nodules (5-6 mm) in the upper-outer R breast, and known enlarged LN, left breast (-)      12/01/2017 Imaging    Breast US 12/01/17 IMPRESSION 1. Worsening invasive lobular carcinoma of the right breast with worsening metastatic axillary adenopathy. In addition. Physical exam findings consistent with skin invasion, and concerning for possible invasion of the sternum.  2. The indeterminate left breast calcifications were discussed with the patient and her sister. Given the extent of the disease in the right breast, and possibility of advanced metastatic disease, further work-up of these calcifications will be deferred until after the patient is seen by Dr. Burr Medico. These could be biopsied in the future if it would be helpful for treatment of patient's disease.       12/01/2017 Imaging    Diagnostic Mammogram 12/01/17 IMPRESSION 1. New palpable lump in the upper outer right breast corresponds to a large 5 cm mass in the right axillary tail  concerning for worsening conglomerate adenopathy vs spread of patient's known malignancy 2. In addition, increased size of previously biopsied lobular carcinoma in the medial lower right breast with new skin thickening and nipple retraction concerning for new skin involvement. 3. Increased size and number of indeterminate clustered calcifications in the left upper outer breast middle depth.       12/14/2017 Imaging    CT CAP w contrast 12/14/17  IMPRESSION: large mass within the right breast. There is parenchymal and trabecular thickening within the right breast as well as skin thickening overlying the right breast. Findings are most compatible with right breast carcinoma, potentially inflammatory carcinoma given the skin thickening. There is abnormal soft tissue within the right axilla concerning for right axillary adenopathy. Bilateral pulmonary nodules concerning for metastatic disease. Left first and right lateral sixth rib lytic lesions with associated fractures compatible with osseous metastatic disease. Patchy sclerosis within the right ilium concerning for osseous metastatic disease. Indeterminate lobular low attenuation within the stomach. Recommend further evaluation with direct visualization to exclude the possibility of gastric mass. Indeterminate low-attenuation lesions within the right kidney, likely represent cysts with elevated internal attenuation due to quantum mottle artifact. Recommend attention on follow-up exam.      12/14/2017 Imaging    Whole body bone scan 12/14/17 IMPRESSION: uptake at the lateral LEFT first and lateral RIGHT sixth ribs corresponding to lytic lesions with pathologic fractures by CT. Multiple sites of degenerative type uptake as above. Uptake surrounding RIGHT hip  and BILATERAL knee prostheses, nonspecific, could be related to surgery if recent, at delayed intervals more suggestive of aseptic loosening or infection.       12/23/2017 -  Anti-estrogen oral therapy     -first-line anastrozole 1 mg daily starting 12/23/17 -Ibrance 125 mg daily, 3 weeks on 1 week off starting 01/06/18      CURRENT THERAPY:  -first-line anastrozole 1 mg daily started 12/23/17 -Ibrance 125 mg daily, 1 week on and 1 week off starting 01/22/18   INTERVAL HISTORY: Peggy Welch returns with her sister as scheduled. She started ibrance 01/23/18 and has taken daily for 2 weeks. She reports she doesn't feel well but cannot qualify with specifics. She has occasional nausea with certain foods, pepto-bismol helps. No vomiting, constipation, or diarrhea. She is able to perform her routine activities. No fever or chills. Hot flashes are stable, has previously declined medication for this. Has chronic dry cough secondary to asthma. No chest pain or dyspnea.   REVIEW OF SYSTEMS:   Constitutional: Denies fevers, chills or abnormal weight loss (+) low po intake at baseline but no change in appetite  Eyes: Denies blurriness of vision Ears, nose, mouth, throat, and face: Denies mucositis or sore throat Respiratory: Denies dyspnea or wheezes (+) dry cough at baseline, asthma  Cardiovascular: Denies palpitation, chest discomfort (+) R>L LE edema secondary to knee replacement  Gastrointestinal:  Denies vomiting, constipation, diarrhea, heartburn or change in bowel habits (+) occasional nausea with certain foods, managed with pepto-bismol  Skin: Denies abnormal skin rashes (+) right breast skin wound   Lymphatics: Denies new lymphadenopathy or easy bruising Neurological:Denies numbness, tingling or new weaknesses Behavioral/Psych: Mood is stable, no new changes  All other systems were reviewed with the patient and are negative.  MEDICAL HISTORY:  Past Medical History:  Diagnosis Date  . Arthritis   . Asthma   . Asthma exacerbation 10/14/2011  . Blood transfusion without reported diagnosis   . Hepatitis C    from blood transfusion  . Hypertension   . Pneumonia 4/02016  . rt breast ca dx'd 2019     SURGICAL HISTORY: Past Surgical History:  Procedure Laterality Date  . CHOLECYSTECTOMY  02/21/2002  . JOINT REPLACEMENT     Bilateral total knee replacements  . knee replacements Bilateral   . Right Rotator Cuff Repair  08/12/2006  . TOTAL HIP ARTHROPLASTY Right 11/22/2014   dr Lorin Mercy  . TOTAL HIP ARTHROPLASTY Right 11/22/2014   Procedure: TOTAL HIP ARTHROPLASTY ANTERIOR APPROACH;  Surgeon: Marybelle Killings, MD;  Location: [facility];  Service: Orthopedics;  Laterality: Right;    I have reviewed the social history and family history with the patient and they are unchanged from previous note.  ALLERGIES:  is allergic to shrimp [shellfish allergy].  MEDICATIONS:  Current Outpatient Medications  Medication Sig Dispense Refill  . acetaminophen (TYLENOL) 650 MG CR tablet Take 650 mg by mouth every 8 (eight) hours as needed for pain (pain).    Marland Kitchen albuterol (PROVENTIL) (2.5 MG/3ML) 0.083% nebulizer solution Take 2.5 mg by nebulization every 4 (four) hours as needed for wheezing or shortness of breath (wheezing).    Marland Kitchen alendronate (FOSAMAX) 70 MG tablet Take 70 mg by mouth every 7 (seven) days. On Tuesdays. Take with a full glass of water on an empty stomach.    Marland Kitchen anastrozole (ARIMIDEX) 1 MG tablet Take 1 tablet (1 mg total) by mouth daily. 30 tablet 3  . aspirin 81 MG tablet Take 81 mg by mouth  daily.    . B Complex-C (B-COMPLEX WITH VITAMIN C) tablet Take 1 tablet by mouth daily.    . hydrochlorothiazide (HYDRODIURIL) 25 MG tablet Take 25 mg by mouth daily.    . naproxen (NAPROSYN) 250 MG tablet Take 1 tablet (250 mg total) by mouth 2 (two) times daily. 16 tablet 0  . palbociclib (IBRANCE) 125 MG capsule Take 1 capsule (125 mg total) by mouth daily with breakfast. Take whole with food. Take for 21 days on, 7 days off, repeat every 28 days. 21 capsule 0  . Potassium Chloride ER 20 MEQ TBCR TK 1 T PO D  0  . potassium chloride SA (K-DUR,KLOR-CON) 20 MEQ tablet Take 20 mEq by mouth daily.  0   No  current facility-administered medications for this visit.     PHYSICAL EXAMINATION: ECOG PERFORMANCE STATUS: 2 - Symptomatic, <50% confined to bed  Vitals:   02/04/18 0927  BP: 131/85  Pulse: 76  Resp: 18  Temp: 98.4 F (36.9 C)  SpO2: 98%   Filed Weights   02/04/18 0927  Weight: 183 lb 12.8 oz (83.4 kg)    GENERAL:alert, no distress and comfortable SKIN: no rashes  EYES: normal, Conjunctiva are pink and non-injected, sclera clear OROPHARYNX:no thrush or ulcers  LYMPH:  no palpable cervical or supraclavicular lymphadenopathy LUNGS: clear to auscultation with normal breathing effort HEART: regular rate & rhythm; R>L lower extremity edema without calf tenderness  ABDOMEN:abdomen soft, non-tender and normal bowel sounds Musculoskeletal:no cyanosis of digits and no clubbing  NEURO: alert & oriented x 3 with fluent speech, no focal motor/sensory deficits BREAST: right breast with firm fixed mass in the low right axilla measuring 4x3 cm. There is a palpable mass in the upper outer quadrant measuring approximately 10x8 cm including the areola and nipple; as well as a mass in the inner lower quadrant near the skin wound measuring 6x5 cm. There is diffuse skin thickening, hyperpigmentation, and edema. Findings extend throughout the upper outer, lower outer, and lower inner quadrants of the right breast. The skin wound appears unchanged, no drainage or new skin wounds      LABORATORY DATA:  I have reviewed the data as listed CBC Latest Ref Rng & Units 02/04/2018 01/21/2018 12/04/2017  WBC 3.9 - 10.3 K/uL 5.4 5.2 7.2  Hemoglobin 11.6 - 15.9 g/dL 11.2(L) 11.4(L) 11.5(L)  Hematocrit 34.8 - 46.6 % 34.2(L) 34.8 36.0  Platelets 145 - 400 K/uL 300 280 334     CMP Latest Ref Rng & Units 02/04/2018 01/21/2018 12/04/2017  Glucose 70 - 140 mg/dL 89 88 88  BUN 7 - 26 mg/dL '13 13 11  '$ Creatinine 0.60 - 1.10 mg/dL 0.80 0.82 0.82  Sodium 136 - 145 mmol/L 139 140 142  Potassium 3.5 - 5.1 mmol/L 3.8  3.6 3.7  Chloride 98 - 109 mmol/L 103 102 104  CO2 22 - 29 mmol/L 28 30(H) 29  Calcium 8.4 - 10.4 mg/dL 9.1 9.3 9.4  Total Protein 6.4 - 8.3 g/dL 6.9 7.3 7.6  Total Bilirubin 0.2 - 1.2 mg/dL 0.6 0.5 0.6  Alkaline Phos 40 - 150 U/L 56 52 64  AST 5 - 34 U/L '28 29 29  '$ ALT 0 - 55 U/L '13 16 17   '$ PATHOLOGY REPORT Diagnosis 02/27/2015  1. Breast, right, needle core biopsy - INVASIVE LOBULAR CARCINOMA. - LOBULAR CARCINOMA IN SITU. - SEE COMMENT. 2. Lymph node, needle/core biopsy, right axilla - ONE LYMPH NODE POSITIVE FOR METASTATIC LOBULAR CARCINOMA (1/1). - SEE COMMENT.  Microscopic Comment 1. An E-cadherin immunohistochemical stain is performed on the right needle core biopsy. The stain is negative in both the in situ and invasive components confirming the above diagnosis. Although definitive grading of breast carcinoma is best done on excision, the features of the invasive tumor from the right needle core biopsy are compatible with a grade 1-2 breast carcinoma. Breast prognostic markers will be performed and reported in an addendum. Findings are called to [facility] on 03/01/2015. Dr. Lyndon Code has seen the first specimen in consultation with agreement. 2. A cytokeratin AE1/AE3 immunohistochemical stain is performed on the second specimen (right axillary lymph node biopsy). The stain highlights scattered tumor cells confirming the above diagnosis. Dr. Lyndon Code has seen the second specimen in consultation with agreement. A breast prognostic profile will also be performed on the right axillary lymph node and reported in an addendum to follow. (RH:ecj 03/01/2015)  1. PROGNOSTIC INDICATORS - ACIS (BLOCK 1A) Results: IMMUNOHISTOCHEMICAL AND MORPHOMETRIC ANALYSIS BY THE AUTOMATED CELLULAR IMAGING SYSTEM (ACIS) Estrogen Receptor: 95%, POSITIVE, STRONG STAINING INTENSITY (PERFORMED MANUALLY) Progesterone Receptor: 0%, NEGATIVE, (PERFORMED MANUALLY) Proliferation Marker Ki67: 5% (PERFORMED  MANUALLY) COMMENT: The negative hormone receptor study(ies) in this case has an internal positive control. Results: HER2 - NEGATIVE RATIO OF HER2/CEP17 SIGNALS 1.28 AVERAGE HER2 COPY NUMBER PER CELL 2.30  2. PROGNOSTIC INDICATORS - ACIS (BLOCK 2A) Results: IMMUNOHISTOCHEMICAL AND MORPHOMETRIC ANALYSIS BY THE AUTOMATED CELLULAR IMAGING SYSTEM (ACIS) Estrogen Receptor: 95%, POSITIVE, STRONG STAINING INTENSITY (PERFORMED MANUALLY) Progesterone Receptor: 0%, NEGATIVE, (PERFORMED MANUALLY) Proliferation Marker Ki67: 5% (PERFORMED MANUALLY) COMMENT: The negative hormone receptor study(ies) in this case has no internal positive control. 2. FLUORESCENCE IN-SITU HYBRIDIZATION Results: HER2 - NEGATIVE RATIO OF HER2/CEP17 SIGNALS 1.10 AVERAGE HER2 COPY NUMBER PER CELL 2.20     RADIOGRAPHIC STUDIES: I have personally reviewed the radiological images as listed and agreed with the findings in the report. No results found.   ASSESSMENT & PLAN: 77 y.o.  African-American female, with past medical history of osteoarthritis, status post multiple orthopedic surgeries, but is still completely independent and active.  1. 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 Osteoporosis, on Fosamax per PCP Dr. Criss Rosales 3. Goals of care discussion - full code   Ms. Beason appears stable. She continues daily arimidex and started ibrance 125 mg daily on 01/23/18, she appears to be tolerating it well with mild nausea. She declined anti-emetic such as zofran. She feels pepto-bismol is sufficient. Her breast masses appear larger by my exam, skin wound is stable. She has not yet completed a full cycle of ibrance, will continue for now. The patient and her sister agree. She previously declined palliative radiation and again today is not interested.  Labs reviewed, she has stable mild anemia, otherwise unremarkable. Dr.  Burr Medico initially intended for her to take ibrance 1 week on/1 week off due to patient's age and not wanting aggressive medical treatment, but she took daily for 2 weeks and has tolerated it well. Will continue dosing 21 days on and 7 days off. Will see her back in 2 weeks before cycle 2 to assess and monitor her closely. The plan was reviewed with Dr. Burr Medico.   PLAN: -labs reviewed, continue Ibrance daily 3 weeks on and 1 week off -lab, f/u Dr. Burr Medico in 2 weeks before cycle 2  All questions were answered. The patient knows to call the clinic with any problems, questions or concerns.  No barriers to learning was detected.     Alla Feeling, NP 02/04/18

## 2018-02-04 NOTE — Telephone Encounter (Signed)
Appointments scheduled AVS/Calendar printed per 6/13 los °

## 2018-02-16 DIAGNOSIS — J449 Chronic obstructive pulmonary disease, unspecified: Secondary | ICD-10-CM | POA: Diagnosis not present

## 2018-02-17 ENCOUNTER — Telehealth: Payer: Self-pay | Admitting: *Deleted

## 2018-02-17 NOTE — Progress Notes (Addendum)
[place]  [phone] Fax:([phone]) [phone]  Clinic Follow up Note   Patient Care Team: Lucianne Lei, MD as PCP - General (Family Medicine) Rockwell Germany, RN as Registered Nurse Mauro Kaufmann, RN as Registered Nurse Kyung Rudd, MD as Consulting Physician (Radiation Oncology) Coralie Keens, MD as Consulting Physician (General Surgery) 02/18/2018  SUMMARY OF ONCOLOGIC HISTORY:   Breast cancer of lower-inner quadrant of right female breast ([place])   02/27/2015 Receptors her2    ER 95%+, PR-, HER2 (-), KI67 5%       02/27/2015 Initial Biopsy    right breast mass and axillary node biopsy showed invasive lobular carcinoma, (+) LCIS       03/05/2015 Initial Diagnosis    Breast cancer of lower-inner quadrant of right female breast      03/08/2015 Imaging    Breast MRI showed:  Spiculated biopsy proven malignancy in the lower inner R breast  2.1 x 2.5 x 2.3 cm,  6 mm oval mass in the R breast 9 o'clock position, Oval ehancing nodules (5-6 mm) in the upper-outer R breast, and known enlarged LN, left breast (-)      12/01/2017 Imaging    Breast US 12/01/17 IMPRESSION 1. Worsening invasive lobular carcinoma of the right breast with worsening metastatic axillary adenopathy. In addition. Physical exam findings consistent with skin invasion, and concerning for possible invasion of the sternum.  2. The indeterminate left breast calcifications were discussed with the patient and her sister. Given the extent of the disease in the right breast, and possibility of advanced metastatic disease, further work-up of these calcifications will be deferred until after the patient is seen by Dr. Burr Medico. These could be biopsied in the future if it would be helpful for treatment of patient's disease.       12/01/2017 Imaging    Diagnostic Mammogram 12/01/17 IMPRESSION 1. New palpable lump in the upper outer right breast corresponds to a large 5 cm mass in the right axillary tail  concerning for worsening conglomerate adenopathy vs spread of patient's known malignancy 2. In addition, increased size of previously biopsied lobular carcinoma in the medial lower right breast with new skin thickening and nipple retraction concerning for new skin involvement. 3. Increased size and number of indeterminate clustered calcifications in the left upper outer breast middle depth.       12/14/2017 Imaging    CT CAP w contrast 12/14/17  IMPRESSION: large mass within the right breast. There is parenchymal and trabecular thickening within the right breast as well as skin thickening overlying the right breast. Findings are most compatible with right breast carcinoma, potentially inflammatory carcinoma given the skin thickening. There is abnormal soft tissue within the right axilla concerning for right axillary adenopathy. Bilateral pulmonary nodules concerning for metastatic disease. Left first and right lateral sixth rib lytic lesions with associated fractures compatible with osseous metastatic disease. Patchy sclerosis within the right ilium concerning for osseous metastatic disease. Indeterminate lobular low attenuation within the stomach. Recommend further evaluation with direct visualization to exclude the possibility of gastric mass. Indeterminate low-attenuation lesions within the right kidney, likely represent cysts with elevated internal attenuation due to quantum mottle artifact. Recommend attention on follow-up exam.      12/14/2017 Imaging    Whole body bone scan 12/14/17 IMPRESSION: uptake at the lateral LEFT first and lateral RIGHT sixth ribs corresponding to lytic lesions with pathologic fractures by CT. Multiple sites of degenerative type uptake as above. Uptake surrounding RIGHT hip  and BILATERAL knee prostheses, nonspecific, could be related to surgery if recent, at delayed intervals more suggestive of aseptic loosening or infection.       12/23/2017 -  Anti-estrogen oral therapy     -first-line anastrozole 1 mg daily starting 12/23/17 -Ibrance 125 mg daily, 3 weeks on 1 week off starting 01/06/18      CURRENT THERAPY: -first-line anastrozole 1 mg dailystarted5/1/19 -Ibrance 125 mg daily,3 weeks on/ 1 week offstarting 01/23/18  INTERVAL HISTORY: Ms. Harjo returns for follow up with her sister as scheduled. She completed 3 weeks ibrance and continues arimidex. She notes she does not feel well when taking ibrance, but cannot characterize her symptoms, mostly fatigue. She remains able to leave the house to go on outings with sister and family. She feels better this week off. She denies decreased appetite, n/v/c/d, fever, chills, rash, or breast pain. She has chronic cough related to asthma.   REVIEW OF SYSTEMS:   Constitutional: Denies fevers, chills or abnormal weight loss (+) fatigue  Eyes: Denies blurriness of vision Ears, nose, mouth, throat, and face: Denies mucositis or sore throat Respiratory: Denies dyspnea or wheezes (+) chronic cough  Cardiovascular: Denies palpitation, chest discomfort or lower extremity swelling Gastrointestinal:  Denies nausea, vomiting, constipation, diarrhea, heartburn or change in bowel habits Skin: Denies abnormal skin rashes Lymphatics: Denies new lymphadenopathy or easy bruising Neurological:Denies numbness, tingling or new weaknesses Behavioral/Psych: Mood is stable, no new changes  Breast: (+) breast mass (+) skin wound  All other systems were reviewed with the patient and are negative.  MEDICAL HISTORY:  Past Medical History:  Diagnosis Date  . Arthritis   . Asthma   . Asthma exacerbation 10/14/2011  . Blood transfusion without reported diagnosis   . Hepatitis C    from blood transfusion  . Hypertension   . Pneumonia 4/02016  . rt breast ca dx'd 2019    SURGICAL HISTORY: Past Surgical History:  Procedure Laterality Date  . CHOLECYSTECTOMY  02/21/2002  . JOINT REPLACEMENT     Bilateral total knee replacements  .  knee replacements Bilateral   . Right Rotator Cuff Repair  08/12/2006  . TOTAL HIP ARTHROPLASTY Right 11/22/2014   dr Lorin Mercy  . TOTAL HIP ARTHROPLASTY Right 11/22/2014   Procedure: TOTAL HIP ARTHROPLASTY ANTERIOR APPROACH;  Surgeon: Marybelle Killings, MD;  Location: [facility];  Service: Orthopedics;  Laterality: Right;    I have reviewed the social history and family history with the patient and they are unchanged from previous note.  ALLERGIES:  is allergic to shrimp [shellfish allergy].  MEDICATIONS:  Current Outpatient Medications  Medication Sig Dispense Refill  . acetaminophen (TYLENOL) 650 MG CR tablet Take 650 mg by mouth every 8 (eight) hours as needed for pain (pain).    Marland Kitchen albuterol (PROVENTIL) (2.5 MG/3ML) 0.083% nebulizer solution Take 2.5 mg by nebulization every 4 (four) hours as needed for wheezing or shortness of breath (wheezing).    Marland Kitchen alendronate (FOSAMAX) 70 MG tablet Take 70 mg by mouth every 7 (seven) days. On Tuesdays. Take with a full glass of water on an empty stomach.    Marland Kitchen anastrozole (ARIMIDEX) 1 MG tablet Take 1 tablet (1 mg total) by mouth daily. 30 tablet 3  . aspirin 81 MG tablet Take 81 mg by mouth daily.    . B Complex-C (B-COMPLEX WITH VITAMIN C) tablet Take 1 tablet by mouth daily.    . hydrochlorothiazide (HYDRODIURIL) 25 MG tablet Take 25 mg by mouth daily.    Marland Kitchen  naproxen (NAPROSYN) 250 MG tablet Take 1 tablet (250 mg total) by mouth 2 (two) times daily. 16 tablet 0  . palbociclib (IBRANCE) 125 MG capsule Take 1 capsule (125 mg total) by mouth daily with breakfast. Take whole with food. Take for 21 days on, 7 days off, repeat every 28 days. 21 capsule 0  . Potassium Chloride ER 20 MEQ TBCR TK 1 T PO D  0  . potassium chloride SA (K-DUR,KLOR-CON) 20 MEQ tablet Take 20 mEq by mouth daily.  0   No current facility-administered medications for this visit.     PHYSICAL EXAMINATION: ECOG PERFORMANCE STATUS: 1 - Symptomatic but completely ambulatory  BP 138/74 (BP  Location: Left Arm, Patient Position: Sitting)   Pulse 71   Temp 97.8 F (36.6 C) (Oral)   Resp 17   Ht '5\' 4"'$  (1.626 m)   Wt 183 lb 3.2 oz (83.1 kg)   SpO2 99%   BMI 31.45 kg/m   GENERAL:alert, no distress and comfortable SKIN: no rashes or significant lesion, see breast comment  EYES: normal, Conjunctiva are pink and non-injected, sclera clear OROPHARYNX:no thrush or ulcers  LYMPH:  no palpable cervical or supraclavicular lymphadenopathy LUNGS: clear to auscultation with normal breathing effort HEART: regular rate & rhythm and no murmurs and no lower extremity edema ABDOMEN:abdomen soft, non-tender and normal bowel sounds Musculoskeletal:no cyanosis of digits and no clubbing  NEURO: alert & oriented x 3 with fluent speech, no focal motor/sensory deficits BREAST: exam reveals disfigured right breast. There is a large, irregularly shaped, firm, nontender breast mass extending from the low axilla to the lower outer quadrant including the areola, nipple, and extending into the upper inner and lower inner quadrants. What previously felt to represent three distinct masses now appear to be one contiguous mass. There is diffuse skin thickening, edema, hyperpigmentation, and warmth. The lower inner breast wound is healing, no drainage or erythema. No new skin wound. No palpable mass in left breast or axilla.       LABORATORY DATA:  I have reviewed the data as listed CBC Latest Ref Rng & Units 02/18/2018 02/04/2018 01/21/2018  WBC 3.9 - 10.3 K/uL 4.6 5.4 5.2  Hemoglobin 11.6 - 15.9 g/dL 11.1(L) 11.2(L) 11.4(L)  Hematocrit 34.8 - 46.6 % 33.9(L) 34.2(L) 34.8  Platelets 145 - 400 K/uL 293 300 280     CMP Latest Ref Rng & Units 02/18/2018 02/04/2018 01/21/2018  Glucose 70 - 99 mg/dL 86 89 88  BUN 8 - 23 mg/dL '12 13 13  '$ Creatinine 0.44 - 1.00 mg/dL 0.77 0.80 0.82  Sodium 135 - 145 mmol/L 141 139 140  Potassium 3.5 - 5.1 mmol/L 3.6 3.8 3.6  Chloride 98 - 111 mmol/L 104 103 102  CO2 22 - 32  mmol/L 30 28 30(H)  Calcium 8.9 - 10.3 mg/dL 9.3 9.1 9.3  Total Protein 6.5 - 8.1 g/dL 7.0 6.9 7.3  Total Bilirubin 0.3 - 1.2 mg/dL 0.7 0.6 0.5  Alkaline Phos 38 - 126 U/L 53 56 52  AST 15 - 41 U/L '30 28 29  '$ ALT 0 - 44 U/L '19 13 16    '$ PATHOLOGY REPORT Diagnosis 02/27/2015  1. Breast, right, needle core biopsy - INVASIVE LOBULAR CARCINOMA. - LOBULAR CARCINOMA IN SITU. - SEE COMMENT. 2. Lymph node, needle/core biopsy, right axilla - ONE LYMPH NODE POSITIVE FOR METASTATIC LOBULAR CARCINOMA (1/1). - SEE COMMENT. Microscopic Comment 1. An E-cadherin immunohistochemical stain is performed on the right needle core biopsy. The stain is negative  in both the in situ and invasive components confirming the above diagnosis. Although definitive grading of breast carcinoma is best done on excision, the features of the invasive tumor from the right needle core biopsy are compatible with a grade 1-2 breast carcinoma. Breast prognostic markers will be performed and reported in an addendum. Findings are called to [facility] on 03/01/2015. Dr. Lyndon Code has seen the first specimen in consultation with agreement. 2. A cytokeratin AE1/AE3 immunohistochemical stain is performed on the second specimen (right axillary lymph node biopsy). The stain highlights scattered tumor cells confirming the above diagnosis. Dr. Lyndon Code has seen the second specimen in consultation with agreement. A breast prognostic profile will also be performed on the right axillary lymph node and reported in an addendum to follow. (RH:ecj 03/01/2015)  1. PROGNOSTIC INDICATORS - ACIS (BLOCK 1A) Results: IMMUNOHISTOCHEMICAL AND MORPHOMETRIC ANALYSIS BY THE AUTOMATED CELLULAR IMAGING SYSTEM (ACIS) Estrogen Receptor: 95%, POSITIVE, STRONG STAINING INTENSITY (PERFORMED MANUALLY) Progesterone Receptor: 0%, NEGATIVE, (PERFORMED MANUALLY) Proliferation Marker Ki67: 5% (PERFORMED MANUALLY) COMMENT: The negative hormone receptor study(ies) in  this case has an internal positive control. Results: HER2 - NEGATIVE RATIO OF HER2/CEP17 SIGNALS 1.28 AVERAGE HER2 COPY NUMBER PER CELL 2.30  2. PROGNOSTIC INDICATORS - ACIS (BLOCK 2A) Results: IMMUNOHISTOCHEMICAL AND MORPHOMETRIC ANALYSIS BY THE AUTOMATED CELLULAR IMAGING SYSTEM (ACIS) Estrogen Receptor: 95%, POSITIVE, STRONG STAINING INTENSITY (PERFORMED MANUALLY) Progesterone Receptor: 0%, NEGATIVE, (PERFORMED MANUALLY) Proliferation Marker Ki67: 5% (PERFORMED MANUALLY) COMMENT: The negative hormone receptor study(ies) in this case has no internal positive control. 2. FLUORESCENCE IN-SITU HYBRIDIZATION Results: HER2 - NEGATIVE RATIO OF HER2/CEP17 SIGNALS 1.10 AVERAGE HER2 COPY NUMBER PER CELL 2.20       RADIOGRAPHIC STUDIES: I have personally reviewed the radiological images as listed and agreed with the findings in the report. No results found.   ASSESSMENT & PLAN: 77 y.o.African-American female, with past medical history of osteoarthritis, status post multiple orthopedic surgeries, but is still completely independent and active.  1. 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 Osteoporosis, on Fosamax per PCP Dr. Criss Rosales 3. Goals of care discussion - full code   Ms. Billy appears stable. She completed 1 cycle ibrance 125 mg 21 days on/7 days off with daily arimidex. She tolerated moderately well with the exception of considerable fatigue. She does not wish to pursue further Ibrance or AI. She again declined palliative radiation. The patient was seen with Dr. Burr Medico. She was offered dose reduction with altered dosing schedule such as 1 week on/1 week off vs 2 weeks on/1 week off.  It was enforced the goal of this therapy is to control her disease, prolong her life, and potentially improve quality of her life. It was explained to her possible complications from disease  progression if she stops therapy such as cancer spread, pain, skin wound, and bleeding. She was informed once these occur it will be difficult to control and manage. She was encouraged to continue anastrozole alone. She understands the risks of discontinuing therapy. The patient is adamant to stop all treatment and is of sound mind.   Labs reviewed, CBC and CMP stable. Mild anemia secondary to ibrance is stable, Hgb 11.1. She does not require transfusion. CA 27.29 is pending. Will continue to see her and attempt to manage any complications that arise. Return for lab, f/u with Dr. Burr Medico in 1 month. She does not want [place] to update her family, she plans  to.   PLAN:  -Labs reviewed  -Patient request to discontinue ibrance and AI therapy  -Lab, f/u with Dr. Burr Medico in 1 moth   All questions were answered. The patient knows to call the clinic with any problems, questions or concerns. No barriers to learning was detected.     Alla Feeling, NP 02/18/18   Addendum  I have seen the patient, examined her. I agree with the assessment and and plan and have edited the notes.   Ms Garguilo complains of severe fatigue and not feeling well due to her treatment.  I think it is likely related to the [place].  We discussed the option of reduce her dose, change to 1-2 weeks on, one-week off, or stopping Ibrance but continue anastrozole.  We again discussed as a consequence of untreated breast cancer and I strongly encouraged to stay on treatment.  However she is very reluctant to continue treatment, and will likely stop.  She is agreeable to return in 4 weeks for follow-up.  I will again encouraged her to continue at least antiestrogen therapy on next visit.   Truitt Merle  02/18/2018

## 2018-02-17 NOTE — Telephone Encounter (Signed)
"  I'm returning call.  I've missed three calls from this number."  No documented call notes.  Call transferred to collaborative after advising to arrive tomorrow at 10:00 am to register for lab and F/U appointments.

## 2018-02-18 ENCOUNTER — Inpatient Hospital Stay: Payer: Medicare Other

## 2018-02-18 ENCOUNTER — Inpatient Hospital Stay (HOSPITAL_BASED_OUTPATIENT_CLINIC_OR_DEPARTMENT_OTHER): Payer: Medicare Other | Admitting: Nurse Practitioner

## 2018-02-18 ENCOUNTER — Telehealth: Payer: Self-pay | Admitting: Nurse Practitioner

## 2018-02-18 ENCOUNTER — Encounter: Payer: Self-pay | Admitting: Nurse Practitioner

## 2018-02-18 VITALS — BP 138/74 | HR 71 | Temp 97.8°F | Resp 17 | Ht 64.0 in | Wt 183.2 lb

## 2018-02-18 DIAGNOSIS — R05 Cough: Secondary | ICD-10-CM

## 2018-02-18 DIAGNOSIS — C7800 Secondary malignant neoplasm of unspecified lung: Secondary | ICD-10-CM | POA: Diagnosis not present

## 2018-02-18 DIAGNOSIS — M810 Age-related osteoporosis without current pathological fracture: Secondary | ICD-10-CM | POA: Diagnosis not present

## 2018-02-18 DIAGNOSIS — Z7982 Long term (current) use of aspirin: Secondary | ICD-10-CM

## 2018-02-18 DIAGNOSIS — D649 Anemia, unspecified: Secondary | ICD-10-CM | POA: Diagnosis not present

## 2018-02-18 DIAGNOSIS — Z170 Estrogen receptor positive status [ER+]: Secondary | ICD-10-CM

## 2018-02-18 DIAGNOSIS — C50311 Malignant neoplasm of lower-inner quadrant of right female breast: Secondary | ICD-10-CM

## 2018-02-18 DIAGNOSIS — Z79811 Long term (current) use of aromatase inhibitors: Secondary | ICD-10-CM

## 2018-02-18 DIAGNOSIS — C7951 Secondary malignant neoplasm of bone: Secondary | ICD-10-CM

## 2018-02-18 DIAGNOSIS — J45901 Unspecified asthma with (acute) exacerbation: Secondary | ICD-10-CM | POA: Diagnosis not present

## 2018-02-18 DIAGNOSIS — Z79899 Other long term (current) drug therapy: Secondary | ICD-10-CM

## 2018-02-18 DIAGNOSIS — R530 Neoplastic (malignant) related fatigue: Secondary | ICD-10-CM | POA: Diagnosis not present

## 2018-02-18 LAB — CMP (CANCER CENTER ONLY)
ALBUMIN: 3.8 g/dL (ref 3.5–5.0)
ALT: 19 U/L (ref 0–44)
AST: 30 U/L (ref 15–41)
Alkaline Phosphatase: 53 U/L (ref 38–126)
Anion gap: 7 (ref 5–15)
BUN: 12 mg/dL (ref 8–23)
CHLORIDE: 104 mmol/L (ref 98–111)
CO2: 30 mmol/L (ref 22–32)
CREATININE: 0.77 mg/dL (ref 0.44–1.00)
Calcium: 9.3 mg/dL (ref 8.9–10.3)
GFR, Estimated: >60 mL/min (ref >60)
Glucose, Bld: 86 mg/dL (ref 70–99)
Potassium: 3.6 mmol/L (ref 3.5–5.1)
SODIUM: 141 mmol/L (ref 135–145)
Total Bilirubin: 0.7 mg/dL (ref 0.3–1.2)
Total Protein: 7 g/dL (ref 6.5–8.1)

## 2018-02-18 LAB — CBC WITH DIFFERENTIAL (CANCER CENTER ONLY)
BASOS PCT: 1 %
Basophils Absolute: 0 10*3/uL (ref 0.0–0.1)
EOS PCT: 8 %
Eosinophils Absolute: 0.4 10*3/uL (ref 0.0–0.5)
HCT: 33.9 % — ABNORMAL LOW (ref 34.8–46.6)
Hemoglobin: 11.1 g/dL — ABNORMAL LOW (ref 11.6–15.9)
Lymphocytes Relative: 35 %
Lymphs Abs: 1.6 10*3/uL (ref 0.9–3.3)
MCH: 30.4 pg (ref 25.1–34.0)
MCHC: 32.6 g/dL (ref 31.5–36.0)
MCV: 93.3 fL (ref 79.5–101.0)
Monocytes Absolute: 0.4 10*3/uL (ref 0.1–0.9)
Monocytes Relative: 9 %
Neutro Abs: 2.2 10*3/uL (ref 1.5–6.5)
Neutrophils Relative %: 47 %
PLATELETS: 293 10*3/uL (ref 145–400)
RBC: 3.64 MIL/uL — AB (ref 3.70–5.45)
RDW: 14 % (ref 11.2–14.5)
WBC: 4.6 10*3/uL (ref 3.9–10.3)

## 2018-02-18 NOTE — Telephone Encounter (Signed)
Scheduled appt per 6/27 los - gave patient aVS and calender per los.  

## 2018-02-19 LAB — CANCER ANTIGEN 27.29: CAN 27.29: 121.9 U/mL — AB (ref 0.0–38.6)

## 2018-03-15 NOTE — Progress Notes (Addendum)
[facility]  [phone] Fax:([phone]  Clinic Follow Up Note   Patient Care Team: Lucianne Lei, MD as PCP - General (Family Medicine) Rockwell Germany, RN as Registered Nurse Mauro Kaufmann, RN as Registered Nurse Kyung Rudd, MD as Consulting Physician (Radiation Oncology) Coralie Keens, MD as Consulting Physician (General Surgery)   Date of Service:  03/23/2018  CHIEF COMPLAINTS:  Follow up right breast cancer.    Breast cancer of lower-inner quadrant of right female breast ([place])   02/27/2015 Receptors her2    ER 95%+, PR-, HER2 (-), KI67 5%       02/27/2015 Initial Biopsy    right breast mass and axillary node biopsy showed invasive lobular carcinoma, (+) LCIS       03/05/2015 Initial Diagnosis    Breast cancer of lower-inner quadrant of right female breast      03/08/2015 Imaging    Breast MRI showed:  Spiculated biopsy proven malignancy in the lower inner R breast  2.1 x 2.5 x 2.3 cm,  6 mm oval mass in the R breast 9 o'clock position, Oval ehancing nodules (5-6 mm) in the upper-outer R breast, and known enlarged LN, left breast (-)      12/01/2017 Imaging    Breast US 12/01/17 IMPRESSION 1. Worsening invasive lobular carcinoma of the right breast with worsening metastatic axillary adenopathy. In addition. Physical exam findings consistent with skin invasion, and concerning for possible invasion of the sternum.  2. The indeterminate left breast calcifications were discussed with the patient and her sister. Given the extent of the disease in the right breast, and possibility of advanced metastatic disease, further work-up of these calcifications will be deferred until after the patient is seen by Dr. Burr Medico. These could be biopsied in the future if it would be helpful for treatment of patient's disease.       12/01/2017 Imaging    Diagnostic Mammogram 12/01/17 IMPRESSION 1. New palpable lump in the upper outer right breast corresponds to a  large 5 cm mass in the right axillary tail concerning for worsening conglomerate adenopathy vs spread of patient's known malignancy 2. In addition, increased size of previously biopsied lobular carcinoma in the medial lower right breast with new skin thickening and nipple retraction concerning for new skin involvement. 3. Increased size and number of indeterminate clustered calcifications in the left upper outer breast middle depth.       12/14/2017 Imaging    CT CAP w contrast 12/14/17  IMPRESSION: large mass within the right breast. There is parenchymal and trabecular thickening within the right breast as well as skin thickening overlying the right breast. Findings are most compatible with right breast carcinoma, potentially inflammatory carcinoma given the skin thickening. There is abnormal soft tissue within the right axilla concerning for right axillary adenopathy. Bilateral pulmonary nodules concerning for metastatic disease. Left first and right lateral sixth rib lytic lesions with associated fractures compatible with osseous metastatic disease. Patchy sclerosis within the right ilium concerning for osseous metastatic disease. Indeterminate lobular low attenuation within the stomach. Recommend further evaluation with direct visualization to exclude the possibility of gastric mass. Indeterminate low-attenuation lesions within the right kidney, likely represent cysts with elevated internal attenuation due to quantum mottle artifact. Recommend attention on follow-up exam.      12/14/2017 Imaging    Whole body bone scan 12/14/17 IMPRESSION: uptake at the lateral LEFT first and lateral RIGHT sixth ribs corresponding to lytic lesions with pathologic fractures by CT. Multiple  sites of degenerative type uptake as above. Uptake surrounding RIGHT hip and BILATERAL knee prostheses, nonspecific, could be related to surgery if recent, at delayed intervals more suggestive of aseptic loosening or infection.         12/23/2017 -  Anti-estrogen oral therapy    -first-line anastrozole 1 mg daily starting 12/23/17 -Ibrance 125 mg daily, 3 weeks on 1 week off starting 01/06/18       02/18/2018 Tumor Marker     Ref. Range 01/21/2018 09:48 02/18/2018 09:55  CA 27.29 Latest Ref Range: 0.0 - 38.6 U/mL 117.7 (H) 121.9 (H)          HISTORY OF PRESENTING ILLNESS (03/20/2015):  Peggy Welch 77 y.o. female is here because of recently you diagnosed right breast cancer.  She felt a lump in the right breast about 2 months ago, nontender, no skin or nipple change. She was seen by her primary care physician, mammogram showed a right breast mass. She underwent a core needle biopsy of the right breast mass on 02/27/2015, which revealed invasive lobular carcinoma, ER positive, PR negative, HER-2 negative. She was seen by Dr. Ninfa Linden, and was referred to [place] to discuss treatment options.  She has moderate arthritis, had a bilateral knee replacement, and recently right hip replacement in April. She has mild to moderate arthritis pain, but remains fairly active. She lives with her granddaughter, completely independent, goes out with her sister for shopping, etc she denies any other significant pain, no dyspnea, no GI symptoms. She has good appetite and eats well. No recent weight loss.  CURRENT THERAPY:  -first-line anastrozole 1 mg daily started 12/23/17, pt stopped on 02/18/2018 and restart on 03/24/2018 -Ibrance 125 mg daily, 1 week on and 1 week off starting 01/22/18, pt stopped on 02/18/2018  INTERIM HISTORY Peggy Welch returns to the clinic for follow-up. She presents to the office today accompanied by her daughter. She is a hard of hearing. She previously refused treatment. She feels fine. Her only complaint today is right extremity swelling. She is right handed. She says that the swelling is not bothering her. She states that she tried anastrozole for a week, but she couldn't tolerate it. She wouldn't say what exactly  happened. She would like to have a biopsy before continuing treatment. She said that if the biopsy showed cancer, she will resume treatment.  No nipple discharge.    MEDICAL HISTORY:  Past Medical History:  Diagnosis Date  . Arthritis   . Asthma   . Asthma exacerbation 10/14/2011  . Blood transfusion without reported diagnosis   . Hepatitis C    from blood transfusion  . Hypertension   . Pneumonia 4/02016  . rt breast ca dx'd 2019    SURGICAL HISTORY: Past Surgical History:  Procedure Laterality Date  . CHOLECYSTECTOMY  02/21/2002  . JOINT REPLACEMENT     Bilateral total knee replacements  . knee replacements Bilateral   . Right Rotator Cuff Repair  08/12/2006  . TOTAL HIP ARTHROPLASTY Right 11/22/2014   dr Lorin Mercy  . TOTAL HIP ARTHROPLASTY Right 11/22/2014   Procedure: TOTAL HIP ARTHROPLASTY ANTERIOR APPROACH;  Surgeon: Marybelle Killings, MD;  Location: [facility];  Service: Orthopedics;  Laterality: Right;    SOCIAL HISTORY: Social History   Socioeconomic History  . Marital status: Legally Separated    Spouse name: Not on file  . Number of children: Not on file  . Years of education: Not on file  . Highest education level: Not on  file  Occupational History  . Not on file  Social Needs  . Financial resource strain: Not on file  . Food insecurity:    Worry: Not on file    Inability: Not on file  . Transportation needs:    Medical: Not on file    Non-medical: Not on file  Tobacco Use  . Smoking status: Never Smoker  . Smokeless tobacco: Never Used  Substance and Sexual Activity  . Alcohol use: No  . Drug use: No  . Sexual activity: Never  Lifestyle  . Physical activity:    Days per week: Not on file    Minutes per session: Not on file  . Stress: Not on file  Relationships  . Social connections:    Talks on phone: Not on file    Gets together: Not on file    Attends religious service: Not on file    Active member of club or organization: Not on file    Attends  meetings of clubs or organizations: Not on file    Relationship status: Not on file  . Intimate partner violence:    Fear of current or ex partner: Not on file    Emotionally abused: Not on file    Physically abused: Not on file    Forced sexual activity: Not on file  Other Topics Concern  . Not on file  Social History Narrative  . Not on file    FAMILY HISTORY: Family History  Problem Relation Age of Onset  . Hypertension Sister     ALLERGIES:  is allergic to shrimp [shellfish allergy].  MEDICATIONS:  Current Outpatient Medications  Medication Sig Dispense Refill  . acetaminophen (TYLENOL) 650 MG CR tablet Take 650 mg by mouth every 8 (eight) hours as needed for pain (pain).    Marland Kitchen albuterol (PROVENTIL) (2.5 MG/3ML) 0.083% nebulizer solution Take 2.5 mg by nebulization every 4 (four) hours as needed for wheezing or shortness of breath (wheezing).    Marland Kitchen alendronate (FOSAMAX) 70 MG tablet Take 70 mg by mouth every 7 (seven) days. On Tuesdays. Take with a full glass of water on an empty stomach.    Marland Kitchen aspirin 81 MG tablet Take 81 mg by mouth daily.    . B Complex-C (B-COMPLEX WITH VITAMIN C) tablet Take 1 tablet by mouth daily.    . hydrochlorothiazide (HYDRODIURIL) 25 MG tablet Take 25 mg by mouth daily.    . naproxen (NAPROSYN) 250 MG tablet Take 1 tablet (250 mg total) by mouth 2 (two) times daily. 16 tablet 0  . Potassium Chloride ER 20 MEQ TBCR TK 1 T PO D  0  . potassium chloride SA (K-DUR,KLOR-CON) 20 MEQ tablet Take 20 mEq by mouth daily.  0   No current facility-administered medications for this visit.     REVIEW OF SYSTEMS:    Constitutional: Denies fevers, chills or abnormal night sweats (+) hot flashes  Eyes: Denies blurriness of vision, double vision or watery eyes Ears, nose, mouth, throat, and face: Denies mucositis or sore throat (+) Hard of hearing  Respiratory: Denies cough, dyspnea or wheezes Cardiovascular: Denies palpitation, chest discomfort or lower  extremity swelling Gastrointestinal:  Denies nausea, heartburn or change in bowel habits Skin: Denies abnormal skin rashes Lymphatics: Denies new lymphadenopathy or easy bruising Neurological:Denies numbness, tingling or new weaknesses (+) dementia Behavioral/Psych: Mood is stable, no new changes  Breast: (+) right breast swelling and skin change All other systems were reviewed with the patient and are negative.  PHYSICAL EXAMINATION: ECOG PERFORMANCE STATUS: 1 - Symptomatic but completely ambulatory  Vitals:   03/23/18 1036  BP: (!) 115/91  Pulse: 69  Resp: 18  Temp: 98.1 F (36.7 C)  SpO2: 100%   Filed Weights   03/23/18 1036  Weight: 178 lb 4.8 oz (80.9 kg)   GENERAL:alert, no distress and comfortable SKIN: skin color, texture, turgor are normal, no rashes or significant lesions EYES: normal, conjunctiva are pink and non-injected, sclera clear OROPHARYNX:no exudate, no erythema and lips, buccal mucosa, and tongue normal  NECK: supple, thyroid normal size, non-tender, without nodularity LYMPH:  no palpable lymphadenopathy in the cervical, axillary or inguinal LUNGS: clear to auscultation and percussion with normal breathing effort HEART: regular rate & rhythm (+) 3/6 systolic murmur  ABDOMEN:abdomen soft, non-tender and normal bowel sounds. NO organomegaly  Musculoskeletal:no cyanosis of digits and no clubbing  PSYCH: alert & oriented x 3 with fluent speech NEURO: no focal motor/sensory deficits Breasts: Her right breast is deformed with nipple retraction towards left inner quadrant. There is a 10 x 10 cm mass in the inner lower quadrant of right breast involving the nipple and areolar area, now with small open in inner lower quadrant of right breast, without bleeding and discharge. It is also a little warm to touch See picture below        LABORATORY DATA:  I have reviewed the data as listed Lab Results  Component Value Date   WBC 5.2 03/23/2018   HGB 11.5 (L)  03/23/2018   HCT 35.5 03/23/2018   MCV 93.4 03/23/2018   PLT 296 03/23/2018   Recent Labs    02/04/18 0904 02/18/18 0955 03/23/18 1014  NA 139 141 142  K 3.8 3.6 3.3*  CL 103 104 102  CO2 _0 GLUCOSE 89 86 87  BUN _1 CREATININE 0.80 0.77 0.75  CALCIUM 9.1 9.3 9.8  GFRNONAA >60 >60 >60  GFRAA >60 >60 >60  PROT 6.9 7.0 7.5  ALBUMIN 3.6 3.8 3.9  AST _2 ALT _3 ALKPHOS 56 53 58  BILITOT 0.6 0.7 0.9    Tumor Marker Results for MARVEL, SAPP (MRN [id number]) as of 03/15/2018 17:04  Ref. Range 01/21/2018 09:48 02/18/2018 09:55  CA 27.29 Latest Ref Range: 0.0 - 38.6 U/mL 117.7 (H) 121.9 (H)    PATHOLOGY REPORT Diagnosis 02/27/2015  1. Breast, right, needle core biopsy - INVASIVE LOBULAR CARCINOMA. - LOBULAR CARCINOMA IN SITU. - SEE COMMENT. 2. Lymph node, needle/core biopsy, right axilla - ONE LYMPH NODE POSITIVE FOR METASTATIC LOBULAR CARCINOMA (1/1). - SEE COMMENT. Microscopic Comment 1. An E-cadherin immunohistochemical stain is performed on the right needle core biopsy. The stain is negative in both the in situ and invasive components confirming the above diagnosis. Although definitive grading of breast carcinoma is best done on excision, the features of the invasive tumor from the right needle core biopsy are compatible with a grade 1-2 breast carcinoma. Breast prognostic markers will be performed and reported in an addendum. Findings are called to [facility] on 03/01/2015. Dr. Lyndon Code has seen the first specimen in consultation with agreement. 2. A cytokeratin AE1/AE3 immunohistochemical stain is performed on the second specimen (right axillary lymph node biopsy). The stain highlights scattered tumor cells confirming the above diagnosis. Dr. Lyndon Code has seen the second specimen in consultation with agreement. A breast prognostic profile will also be performed on the right axillary lymph node and reported in an  addendum to follow.  (RH:ecj 03/01/2015)  1. PROGNOSTIC INDICATORS - ACIS (BLOCK 1A) Results: IMMUNOHISTOCHEMICAL AND MORPHOMETRIC ANALYSIS BY THE AUTOMATED CELLULAR IMAGING SYSTEM (ACIS) Estrogen Receptor: 95%, POSITIVE, STRONG STAINING INTENSITY (PERFORMED MANUALLY) Progesterone Receptor: 0%, NEGATIVE, (PERFORMED MANUALLY) Proliferation Marker Ki67: 5% (PERFORMED MANUALLY) COMMENT: The negative hormone receptor study(ies) in this case has an internal positive control. Results: HER2 - NEGATIVE RATIO OF HER2/CEP17 SIGNALS 1.28 AVERAGE HER2 COPY NUMBER PER CELL 2.30  2. PROGNOSTIC INDICATORS - ACIS (BLOCK 2A) Results: IMMUNOHISTOCHEMICAL AND MORPHOMETRIC ANALYSIS BY THE AUTOMATED CELLULAR IMAGING SYSTEM (ACIS) Estrogen Receptor: 95%, POSITIVE, STRONG STAINING INTENSITY (PERFORMED MANUALLY) Progesterone Receptor: 0%, NEGATIVE, (PERFORMED MANUALLY) Proliferation Marker Ki67: 5% (PERFORMED MANUALLY) COMMENT: The negative hormone receptor study(ies) in this case has no internal positive control. 2. FLUORESCENCE IN-SITU HYBRIDIZATION Results: HER2 - NEGATIVE RATIO OF HER2/CEP17 SIGNALS 1.10 AVERAGE HER2 COPY NUMBER PER CELL 2.20  RADIOGRAPHIC STUDIES: I have personally reviewed the radiological images as listed and agreed with the findings in the report.  12/14/2017 Bone Scan IMPRESSION: Uptake at the lateral LEFT first and lateral RIGHT sixth ribs corresponding to lytic lesions with pathologic fractures by CT.  Multiple sites of degenerative type uptake as above.  Uptake surrounding RIGHT hip and BILATERAL knee prostheses, nonspecific, could be related to surgery if recent, at delayed intervals more suggestive of aseptic loosening or infection.  12/14/2017 CT CAP W Contrast IMPRESSION: 1. Large mass within the right breast. There is parenchymal and trabecular thickening within the right breast as well as skin thickening overlying the right breast. Findings are most compatible with right breast  carcinoma, potentially inflammatory carcinoma given the skin thickening. 2. There is abnormal soft tissue within the right axilla concerning for right axillary adenopathy. 3. Bilateral pulmonary nodules concerning for metastatic disease. 4. Left first and right lateral sixth rib lytic lesions with associated fractures compatible with osseous metastatic disease. 5. Patchy sclerosis within the right ilium concerning for osseous metastatic disease. 6. Indeterminate lobular low attenuation within the stomach. Recommend further evaluation with direct visualization to exclude the possibility of gastric mass. 7. Indeterminate low-attenuation lesions within the right kidney, likely represent cysts with elevated internal attenuation due to quantum mottle artifact. Recommend attention on follow-up exam.  Breast US 12/01/17 IMPRESSION 1. Worsening invasive lobular carcinoma of the right breast with worsening metastatic axillary adenopathy. In addition. Physical exam findings consistent with skin invasion, and concerning for possible invasion of the sternum.  2. The indeterminate left breast calcifications were discussed with the patient and her sister. Given the extent of the disease in the right breast, and possibility of advanced metastatic disease, further work-up of these calcifications will be deferred until after the patient is seen by Dr. Burr Medico. These could be biopsied in the future if it would be helpful for treatment of patient's disease.   Diagnostic Mammogram 12/01/17 IMPRESSION 1. New palpable lump in the upper outer right breast corresponds to a large 5 cm mass in the right axillary tail concerning for worsening conglomerate adenopathy vs spread of patient's known malignancy 2. In addition, increased size of previously biopsied lobular carcinoma in the medial lower right breast with new skin thickening and nipple retraction concerning for new skin involvement. 3. Increased size and number of  indeterminate clustered calcifications in the left upper outer breast middle depth.   Mr Breast Bilateral W Wo Contrast  03/08/2015    IMPRESSION: 1. Spiculated biopsy proven malignancy in the lower inner right breast measures 2.1 x 2.5 x 2.3 cm.  2. 6 mm oval mass in the slightly outer right breast at the approximate 9 o'clock position.  3.  Oval enhancing nodules (5-6 mm) in the upper-outer right breast.  4. Enlarged lymph node in the upper-outer right breast compatible with biopsy proven nodal metastases.  5. No MRI evidence of malignancy in the left breast.   RECOMMENDATION: 1. Second-look ultrasound with biopsy for the 6 mm oval mass in the slightly outer right breast at 9 o'clock.  2. Second-look ultrasound with biopsy for one of the enhancing nodules seen in the upper-outer right breast.  If these nodules cannot be seen via ultrasound, then MRI guided biopsy is recommended if considering breast conservation.  BI-RADS CATEGORY  4: Suspicious.   Electronically Signed   By: Everlean Alstrom M.D.   On: 03/08/2015 12:41    ASSESSMENT & PLAN:  77 y.o.  African-American female, with past medical history of osteoarthritis, status post multiple orthopedic surgeries, but is still completely independent and active.  1. 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 I previously reviewed the natural history of her breast cancer, and the standard treatment option with surgery, which is the only way to cure the cancer. The consequence of a untreated breast cancer, which is likely going to metastasize and becomes incurable in the future, was discussed with her and her sister in great detail. She voiced good understanding -She declined any breast cancer treatment at her initial diagnosis and did not follow up.  -Pt presented back to the clinic on 12/04/17 with obvious clinical  and radiographic cancer progression. Her mammogram and [place] on 12/01/17 revealed larger inner lower quadrant breast tumor with skin invasion, and possible sternum invasion on mammogram.  She also has a new palpable mass in the right upper quadrant of right breast, and bulky axillary adenopathy. -Her staging scan showed bone and lung metastasis.  Given her advanced age, reluctant to do any procedure or treatment, I did not biopsy any metastatic site to confirm.  --She started Anastrozole on 12/23/17 and Ibrance one week after.  Due to the significant fatigue, she stopped both medication in the end of June, and refused to try again on last visit. -We again discussed the consequence of untreated metastatic breast cancer.  She has worsening right arm lymphedema, her right breast mass has significantly increased in size lately.  I strongly encouraged her to consider restarting anastrozole first for disease control.  Her daughter also strongly encouraged her.  After lengthy discussion, she finally agreed to start tomorrow. I will hold on Ibrance for now   -She would like to have a biopsy to confirm her diagnosis, I agreed and will arrange in 2 weeks  -Her right breast mass has enlarged since last visit. Pt knows to call or go to ER if wound becomes painful, discharge increases or changes color or smell, bleeding occurs, or any other concerning or new symptoms occur.  -F/u in 2-3 weeks  2. Osteoporosis  -She takes Fosamax, managed by her PCP Dr. Criss Rosales   3. Goal of care discussion  -We again discussed the incurable nature of her cancer, and the overall poor prognosis, especially if she does not have good response to chemotherapy or progress on chemo -The patient understands the goal of care is palliative.  We also discussed her breast cancer is very treatable, her life expectancy would be several years  -she  is full code for now   4. Hypokalemia - Her K is low today at 3.3 - She states that she is on 31m. I  will increase the dose  5. Right arm lymphedema -Due to metastatic adenopathy in the right axilla -Refer her to lymphedema physical therapy clinic  Plan -She will restart Anastrozole tomorrow  [facility] -I will refer her to lymphedema physical therapy clinic -follow-up after biopsy in 2-3 weeks  All questions were answered. The patient knows to call the clinic with any problems, questions or concerns. I spent 25 minutes counseling the patient face to face. The total time spent in the appointment was 30 minutes and more than 50% was on counseling.  IDierdre SearlesDweik am acting as scribe for Dr. YTruitt Merle  I have reviewed the above documentation for accuracy and completeness, and I agree with the above.     YTruitt Merle MD 03/23/2018

## 2018-03-17 DIAGNOSIS — H902 Conductive hearing loss, unspecified: Secondary | ICD-10-CM | POA: Diagnosis not present

## 2018-03-17 DIAGNOSIS — I10 Essential (primary) hypertension: Secondary | ICD-10-CM | POA: Diagnosis not present

## 2018-03-17 DIAGNOSIS — C50112 Malignant neoplasm of central portion of left female breast: Secondary | ICD-10-CM | POA: Diagnosis not present

## 2018-03-18 DIAGNOSIS — J449 Chronic obstructive pulmonary disease, unspecified: Secondary | ICD-10-CM | POA: Diagnosis not present

## 2018-03-23 ENCOUNTER — Encounter: Payer: Self-pay | Admitting: Hematology

## 2018-03-23 ENCOUNTER — Inpatient Hospital Stay: Payer: Medicare Other | Attending: Hematology | Admitting: Hematology

## 2018-03-23 ENCOUNTER — Inpatient Hospital Stay: Payer: Medicare Other

## 2018-03-23 ENCOUNTER — Telehealth: Payer: Self-pay | Admitting: Hematology

## 2018-03-23 VITALS — BP 115/91 | HR 69 | Temp 98.1°F | Resp 18 | Ht 64.0 in | Wt 178.3 lb

## 2018-03-23 DIAGNOSIS — I890 Lymphedema, not elsewhere classified: Secondary | ICD-10-CM | POA: Diagnosis not present

## 2018-03-23 DIAGNOSIS — C773 Secondary and unspecified malignant neoplasm of axilla and upper limb lymph nodes: Secondary | ICD-10-CM | POA: Insufficient documentation

## 2018-03-23 DIAGNOSIS — Z79899 Other long term (current) drug therapy: Secondary | ICD-10-CM | POA: Insufficient documentation

## 2018-03-23 DIAGNOSIS — Z170 Estrogen receptor positive status [ER+]: Secondary | ICD-10-CM | POA: Insufficient documentation

## 2018-03-23 DIAGNOSIS — M810 Age-related osteoporosis without current pathological fracture: Secondary | ICD-10-CM

## 2018-03-23 DIAGNOSIS — C50411 Malignant neoplasm of upper-outer quadrant of right female breast: Secondary | ICD-10-CM | POA: Insufficient documentation

## 2018-03-23 DIAGNOSIS — Z79811 Long term (current) use of aromatase inhibitors: Secondary | ICD-10-CM | POA: Insufficient documentation

## 2018-03-23 DIAGNOSIS — E876 Hypokalemia: Secondary | ICD-10-CM | POA: Diagnosis not present

## 2018-03-23 DIAGNOSIS — C50412 Malignant neoplasm of upper-outer quadrant of left female breast: Secondary | ICD-10-CM | POA: Insufficient documentation

## 2018-03-23 DIAGNOSIS — Z7982 Long term (current) use of aspirin: Secondary | ICD-10-CM

## 2018-03-23 DIAGNOSIS — Z853 Personal history of malignant neoplasm of breast: Secondary | ICD-10-CM

## 2018-03-23 DIAGNOSIS — C7800 Secondary malignant neoplasm of unspecified lung: Secondary | ICD-10-CM | POA: Diagnosis not present

## 2018-03-23 DIAGNOSIS — C7951 Secondary malignant neoplasm of bone: Secondary | ICD-10-CM | POA: Insufficient documentation

## 2018-03-23 DIAGNOSIS — C50311 Malignant neoplasm of lower-inner quadrant of right female breast: Secondary | ICD-10-CM

## 2018-03-23 LAB — CMP (CANCER CENTER ONLY)
ALBUMIN: 3.9 g/dL (ref 3.5–5.0)
ALT: 15 U/L (ref 0–44)
ANION GAP: 9 (ref 5–15)
AST: 31 U/L (ref 15–41)
Alkaline Phosphatase: 58 U/L (ref 38–126)
BUN: 11 mg/dL (ref 8–23)
CO2: 31 mmol/L (ref 22–32)
Calcium: 9.8 mg/dL (ref 8.9–10.3)
Chloride: 102 mmol/L (ref 98–111)
Creatinine: 0.75 mg/dL (ref 0.44–1.00)
GFR, Est AFR Am: >60 mL/min (ref >60)
GFR, Estimated: >60 mL/min (ref >60)
GLUCOSE: 87 mg/dL (ref 70–99)
POTASSIUM: 3.3 mmol/L — AB (ref 3.5–5.1)
Sodium: 142 mmol/L (ref 135–145)
Total Bilirubin: 0.9 mg/dL (ref 0.3–1.2)
Total Protein: 7.5 g/dL (ref 6.5–8.1)

## 2018-03-23 LAB — CBC WITH DIFFERENTIAL (CANCER CENTER ONLY)
Basophils Absolute: 0 10*3/uL (ref 0.0–0.1)
Basophils Relative: 1 %
Eosinophils Absolute: 0.3 10*3/uL (ref 0.0–0.5)
Eosinophils Relative: 6 %
HCT: 35.5 % (ref 34.8–46.6)
Hemoglobin: 11.5 g/dL — ABNORMAL LOW (ref 11.6–15.9)
LYMPHS ABS: 2.1 10*3/uL (ref 0.9–3.3)
LYMPHS PCT: 40 %
MCH: 30.3 pg (ref 25.1–34.0)
MCHC: 32.4 g/dL (ref 31.5–36.0)
MCV: 93.4 fL (ref 79.5–101.0)
MONO ABS: 0.4 10*3/uL (ref 0.1–0.9)
Monocytes Relative: 8 %
NEUTROS ABS: 2.3 10*3/uL (ref 1.5–6.5)
NEUTROS PCT: 45 %
Platelet Count: 296 10*3/uL (ref 145–400)
RBC: 3.8 MIL/uL (ref 3.70–5.45)
RDW: 13.8 % (ref 11.2–14.5)
WBC Count: 5.2 10*3/uL (ref 3.9–10.3)

## 2018-03-23 NOTE — Telephone Encounter (Signed)
Scheduled apt per 7/30 los - gave patient AVS and calender per los.

## 2018-03-24 ENCOUNTER — Telehealth: Payer: Self-pay

## 2018-03-24 LAB — CANCER ANTIGEN 27.29: CAN 27.29: 152.7 U/mL — AB (ref 0.0–38.6)

## 2018-03-24 NOTE — Telephone Encounter (Signed)
Patient's niece Beverlee Nims called stating that her Aunt didn't understand what was said during her visit yesterday with Dr. Burr Medico.  I reviewed Dr. Ernestina Penna note and answered her questions.

## 2018-04-07 ENCOUNTER — Encounter: Payer: Self-pay | Admitting: Rehabilitation

## 2018-04-07 ENCOUNTER — Telehealth: Payer: Self-pay

## 2018-04-07 ENCOUNTER — Other Ambulatory Visit: Payer: Self-pay

## 2018-04-07 ENCOUNTER — Ambulatory Visit: Payer: Medicare Other | Attending: Hematology | Admitting: Rehabilitation

## 2018-04-07 DIAGNOSIS — Z170 Estrogen receptor positive status [ER+]: Secondary | ICD-10-CM | POA: Diagnosis not present

## 2018-04-07 DIAGNOSIS — C50311 Malignant neoplasm of lower-inner quadrant of right female breast: Secondary | ICD-10-CM | POA: Diagnosis not present

## 2018-04-07 DIAGNOSIS — I890 Lymphedema, not elsewhere classified: Secondary | ICD-10-CM | POA: Insufficient documentation

## 2018-04-07 NOTE — Telephone Encounter (Signed)
Faxed signed Cancer Rehab Referral form to [phone]

## 2018-04-07 NOTE — Patient Instructions (Signed)
We will order the circaid reduction kit at this time and then start some MLD treaments

## 2018-04-07 NOTE — Therapy (Signed)
[place], [place], [address] Phone: [phone]   Fax:  [phone]  Physical Therapy Evaluation  Patient Details  Name: Peggy Welch MRN: [id number] Date of Birth: 12-Dec-1940 Referring Provider: Dr. Truitt Merle   Encounter Date: 04/07/2018  PT End of Session - 04/07/18 1440    Visit Number  1    Number of Visits  5    Date for PT Re-Evaluation  05/19/18    PT Start Time  0938    PT Stop Time  1017    PT Time Calculation (min)  39 min    Activity Tolerance  Patient tolerated treatment well    Behavior During Therapy  [facility] for tasks assessed/performed       Past Medical History:  Diagnosis Date  . Arthritis   . Asthma   . Asthma exacerbation 10/14/2011  . Blood transfusion without reported diagnosis   . Hepatitis C    from blood transfusion  . Hypertension   . Pneumonia 4/02016  . rt breast ca dx'd 2019    Past Surgical History:  Procedure Laterality Date  . CHOLECYSTECTOMY  02/21/2002  . JOINT REPLACEMENT     Bilateral total knee replacements  . knee replacements Bilateral   . Right Rotator Cuff Repair  08/12/2006  . TOTAL HIP ARTHROPLASTY Right 11/22/2014   dr Lorin Mercy  . TOTAL HIP ARTHROPLASTY Right 11/22/2014   Procedure: TOTAL HIP ARTHROPLASTY ANTERIOR APPROACH;  Surgeon: Marybelle Killings, MD;  Location: [facility];  Service: Orthopedics;  Laterality: Right;    There were no vitals filed for this visit.   Subjective Assessment - 04/07/18 0938    Subjective  swelling in the arm started just last week.  slow onset.   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 She declined any breast cancer treatment at her initial diagnosis and did not follow up.  She is now agreeable to anastrozole daily and would like to get a biopsy before doing any other  treatment     Patient Stated Goals  notsure is she wants to participate in therapy at this time     Currently in Pain?  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 Truitt Merle    Onset Date/Surgical Date  02/22/18    Hand Dominance  Right    Next MD Visit  04/13/18    Prior Therapy  no      Precautions   Precaution Comments  active cancer      Restrictions   Weight Bearing Restrictions  No      Balance Screen   Has the patient fallen in the past 6 months  No    Has the patient had a decrease in activity level because of a fear of falling?   No    Is the patient reluctant to leave their home because of a fear of falling?   No      Home Environment   Living Environment  Private residence    Living Arrangements  Other relatives    Available Help at  Discharge  Family    Additional Comments  does not like to depend on family for assistance      Prior Function   Level of Independence  Independent    Leisure  nothing reported      Cognition   Overall Cognitive Status  Within Functional Limits for tasks assessed      Observation/Other Assessments   Skin Integrity  not visually inspected today due to time but picture and note from MD appt showing retracted nipple and small open place near the nipple due to tumor       Coordination   Gross Motor Movements are Fluid and Coordinated  Not tested        LYMPHEDEMA/ONCOLOGY QUESTIONNAIRE - 04/07/18 1002      Type   Cancer Type  Rt breast      Date Lymphedema/Swelling Started   Date  02/22/18      Treatment   Active Chemotherapy Treatment  No    Past Chemotherapy Treatment  No    Active Radiation Treatment  No    Past Radiation Treatment  No    Current Hormone Treatment  Yes    Drug Name  anastrozole       What other symptoms do you have   Are you Having Heaviness or Tightness  Yes      Lymphedema Assessments   Lymphedema Assessments  Upper extremities      Right Upper Extremity Lymphedema   10 cm Proximal to Olecranon Process  34 cm    Olecranon Process  31.4 cm    10 cm Proximal to Ulnar Styloid Process  25.6 cm    Just Proximal to Ulnar Styloid Process  20 cm    Across Hand at [employer]  21.5 cm    At [place] of 2nd Digit  6.7 cm    At [facility] of Thumb  --      Left Upper Extremity Lymphedema   10 cm Proximal to Olecranon Process  28.5 cm    Olecranon Process  27.5 cm    10 cm Proximal to Ulnar Styloid Process  20 cm    Just Proximal to Ulnar Styloid Process  16.6 cm    Across Hand at [employer]  20.2 cm    At [place] of 2nd Digit  6.3 cm             Outpatient Rehab from [facility]07/2018 [facility]  Lymphedema Life Impact Scale Total Score  4.41 %      Objective measurements completed on examination: See above findings.              PT Education - 04/07/18 1440    Education Details  POC options, lymphedema,    Person(s) Educated  Patient    Methods  Explanation    Comprehension  Verbalized understanding          PT Long Term Goals - 04/07/18 1448      PT LONG TERM GOAL #1   Title  Pt will be independent with use of circaid reduction kit for prevention of worsening of swelling     Time  6    Period  Weeks    Status  New    Target Date  05/19/18      PT LONG TERM GOAL #2   Title  Pt will be knowledgeable of self MLD for use as needed    Time  6    Period  Weeks    Status  New    Target Date  05/19/18      PT LONG TERM GOAL #3   Title  Pt will decrease measurements in all locations above the wrist by at least 2cms    Time  6    Period  Weeks    Status  New    Target Date  05/19/18             Plan - 04/07/18 1044    Clinical Impression Statement  Peggy Welch presents today with new onset Rt UE  lymphedema and swelling most likely due to lymphadenopathy of the Rt axillary region and breast with recent worsening of tumor status.  Pt has never undergone any treatment for her cancer from initial onset exept is now taking anastrazole daily.  She is disagreeable to wrapping but is willing to consider a reduction kit with possible therapy sessions once this gets back.  Despite large size difference of almost 5cm in the UE she seems to not be bothered by it but does not want it to get worse once discussing with PT.      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 She declined any breast cancer treatment at her initial diagnosis and did not follow up.     Clinical Presentation  Evolving    Clinical Presentation due to:  worsening of cancer status    Clinical Decision Making  Moderate    Rehab Potential  Fair    Clinical Impairments Affecting Rehab Potential  has not been wanting to participate in treatments since cancer diagnosis    PT Frequency  2x / week    PT Duration  2 weeks   once circaid comes back   PT Next Visit Plan  MLD sessions with home education if pt agreeable? Circaid fit and use    Consulted and Agree with Plan of Care  Patient       Patient will benefit from skilled therapeutic intervention in order to improve the following deficits and impairments:  Decreased skin integrity, Decreased knowledge of precautions, Increased edema, Decreased knowledge of use of DME  Visit Diagnosis: Malignant neoplasm of lower-inner quadrant of right breast of female, estrogen receptor positive ([place])  Lymphedema, not elsewhere classified     Problem List Patient Active Problem List   Diagnosis Date Noted  . Goals of care, counseling/discussion 12/22/2017  . Breast cancer of lower-inner quadrant of right female breast ([place]) 03/05/2015   . Postoperative anemia due to acute blood loss 12/01/2014  . Cough 12/01/2014  . HCAP (healthcare-associated pneumonia) 11/30/2014  . Right lower lobe pneumonia ([place]) 11/30/2014  . Sepsis ([place]) 11/30/2014  . 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 Hypokalemia 10/14/2011  . HTN (hypertension) 10/14/2011    Shan Levans, PT 04/07/2018, 2:51 PM  [facility] [place], [place], [address] Phone: [phone]   Fax:  [phone]  Name: Peggy Welch MRN: [id number] Date of Birth: [birth year]

## 2018-04-08 NOTE — Progress Notes (Signed)
[place]  Telephone:([phone]) [phone] Fax:(336) [phone]  Clinic Follow Up Note   Patient Care Team: Lucianne Lei, MD as PCP - General (Family Medicine) Rockwell Germany, RN as Registered Nurse Mauro Kaufmann, RN as Registered Nurse Kyung Rudd, MD as Consulting Physician (Radiation Oncology) Coralie Keens, MD as Consulting Physician (General Surgery)   Date of Service:  04/13/2018  CHIEF COMPLAINTS:  Follow up right breast cancer.    Breast cancer of lower-inner quadrant of right female breast ([place])   02/27/2015 Receptors her2    ER 95%+, PR-, HER2 (-), KI67 5%     02/27/2015 Initial Biopsy    right breast mass and axillary node biopsy showed invasive lobular carcinoma, (+) LCIS     03/05/2015 Initial Diagnosis    Breast cancer of lower-inner quadrant of right female breast    03/08/2015 Imaging    Breast MRI showed:  Spiculated biopsy proven malignancy in the lower inner R breast  2.1 x 2.5 x 2.3 cm,  6 mm oval mass in the R breast 9 o'clock position, Oval ehancing nodules (5-6 mm) in the upper-outer R breast, and known enlarged LN, left breast (-)    12/01/2017 Imaging    Breast US 12/01/17 IMPRESSION 1. Worsening invasive lobular carcinoma of the right breast with worsening metastatic axillary adenopathy. In addition. Physical exam findings consistent with skin invasion, and concerning for possible invasion of the sternum.  2. The indeterminate left breast calcifications were discussed with the patient and her sister. Given the extent of the disease in the right breast, and possibility of advanced metastatic disease, further work-up of these calcifications will be deferred until after the patient is seen by Dr. Burr Medico. These could be biopsied in the future if it would be helpful for treatment of patient's disease.     12/01/2017 Imaging    Diagnostic Mammogram 12/01/17 IMPRESSION 1. New palpable lump in the upper outer right breast corresponds to a large 5 cm mass in  the right axillary tail concerning for worsening conglomerate adenopathy vs spread of patient's known malignancy 2. In addition, increased size of previously biopsied lobular carcinoma in the medial lower right breast with new skin thickening and nipple retraction concerning for new skin involvement. 3. Increased size and number of indeterminate clustered calcifications in the left upper outer breast middle depth.     12/14/2017 Imaging    CT CAP w contrast 12/14/17  IMPRESSION: large mass within the right breast. There is parenchymal and trabecular thickening within the right breast as well as skin thickening overlying the right breast. Findings are most compatible with right breast carcinoma, potentially inflammatory carcinoma given the skin thickening. There is abnormal soft tissue within the right axilla concerning for right axillary adenopathy. Bilateral pulmonary nodules concerning for metastatic disease. Left first and right lateral sixth rib lytic lesions with associated fractures compatible with osseous metastatic disease. Patchy sclerosis within the right ilium concerning for osseous metastatic disease. Indeterminate lobular low attenuation within the stomach. Recommend further evaluation with direct visualization to exclude the possibility of gastric mass. Indeterminate low-attenuation lesions within the right kidney, likely represent cysts with elevated internal attenuation due to quantum mottle artifact. Recommend attention on follow-up exam.    12/14/2017 Imaging    Whole body bone scan 12/14/17 IMPRESSION: uptake at the lateral LEFT first and lateral RIGHT sixth ribs corresponding to lytic lesions with pathologic fractures by CT. Multiple sites of degenerative type uptake as above. Uptake surrounding RIGHT hip and BILATERAL knee  prostheses, nonspecific, could be related to surgery if recent, at delayed intervals more suggestive of aseptic loosening or infection.     12/23/2017 -  Anti-estrogen  oral therapy    -first-line anastrozole 1 mg daily starting 12/23/17 -Ibrance 125 mg daily, 3 weeks on 1 week off starting 01/06/18     02/18/2018 Tumor Marker     Ref. Range 01/21/2018 09:48 02/18/2018 09:55  CA 27.29 Latest Ref Range: 0.0 - 38.6 U/mL 117.7 (H) 121.9 (H)        HISTORY OF PRESENTING ILLNESS (03/20/2015):  Peggy Welch 77 y.o. female is here because of recently you diagnosed right breast cancer.  She felt a lump in the right breast about 2 months ago, nontender, no skin or nipple change. She was seen by her primary care physician, mammogram showed a right breast mass. She underwent a core needle biopsy of the right breast mass on 02/27/2015, which revealed invasive lobular carcinoma, ER positive, PR negative, HER-2 negative. She was seen by Dr. Ninfa Linden, and was referred to [place] to discuss treatment options.  She has moderate arthritis, had a bilateral knee replacement, and recently right hip replacement in April. She has mild to moderate arthritis pain, but remains fairly active. She lives with her granddaughter, completely independent, goes out with her sister for shopping, etc she denies any other significant pain, no dyspnea, no GI symptoms. She has good appetite and eats well. No recent weight loss.  CURRENT THERAPY:  -first-line anastrozole 1 mg daily started 12/23/17, pt stopped on 02/18/2018 and restart on 03/24/2018 -Ibrance 125 mg daily, 2 weeks on and 1 week off starting 01/22/18, pt stopped on 02/18/2018 due to her poor tolerance  INTERIM HISTORY Peggy Welch returns to the clinic for follow-up. She is here today with 2 family members. She is feeling well and is taking her anastrozole pill. She is tolerating very well and has no new complaints. She denies changes to her breast, but reports that her arm swelling is getting worse.    MEDICAL HISTORY:  Past Medical History:  Diagnosis Date  . Arthritis   . Asthma   . Asthma exacerbation 10/14/2011  . Blood  transfusion without reported diagnosis   . Hepatitis C    from blood transfusion  . Hypertension   . Pneumonia 4/02016  . rt breast ca dx'd 2019    SURGICAL HISTORY: Past Surgical History:  Procedure Laterality Date  . CHOLECYSTECTOMY  02/21/2002  . JOINT REPLACEMENT     Bilateral total knee replacements  . knee replacements Bilateral   . Right Rotator Cuff Repair  08/12/2006  . TOTAL HIP ARTHROPLASTY Right 11/22/2014   dr Lorin Mercy  . TOTAL HIP ARTHROPLASTY Right 11/22/2014   Procedure: TOTAL HIP ARTHROPLASTY ANTERIOR APPROACH;  Surgeon: Marybelle Killings, MD;  Location: [facility];  Service: Orthopedics;  Laterality: Right;    SOCIAL HISTORY: Social History   Socioeconomic History  . Marital status: Legally Separated    Spouse name: Not on file  . Number of children: Not on file  . Years of education: Not on file  . Highest education level: Not on file  Occupational History  . Not on file  Social Needs  . Financial resource strain: Not on file  . Food insecurity:    Worry: Not on file    Inability: Not on file  . Transportation needs:    Medical: Not on file    Non-medical: Not on file  Tobacco Use  . Smoking status:  Never Smoker  . Smokeless tobacco: Never Used  Substance and Sexual Activity  . Alcohol use: No  . Drug use: No  . Sexual activity: Never  Lifestyle  . Physical activity:    Days per week: Not on file    Minutes per session: Not on file  . Stress: Not on file  Relationships  . Social connections:    Talks on phone: Not on file    Gets together: Not on file    Attends religious service: Not on file    Active member of club or organization: Not on file    Attends meetings of clubs or organizations: Not on file    Relationship status: Not on file  . Intimate partner violence:    Fear of current or ex partner: Not on file    Emotionally abused: Not on file    Physically abused: Not on file    Forced sexual activity: Not on file  Other Topics Concern  . Not  on file  Social History Narrative  . Not on file    FAMILY HISTORY: Family History  Problem Relation Age of Onset  . Hypertension Sister     ALLERGIES:  is allergic to shrimp [shellfish allergy].  MEDICATIONS:  Current Outpatient Medications  Medication Sig Dispense Refill  . acetaminophen (TYLENOL) 650 MG CR tablet Take 650 mg by mouth every 8 (eight) hours as needed for pain (pain).    Marland Kitchen albuterol (PROVENTIL) (2.5 MG/3ML) 0.083% nebulizer solution Take 2.5 mg by nebulization every 4 (four) hours as needed for wheezing or shortness of breath (wheezing).    Marland Kitchen alendronate (FOSAMAX) 70 MG tablet Take 70 mg by mouth every 7 (seven) days. On Tuesdays. Take with a full glass of water on an empty stomach.    Marland Kitchen aspirin 81 MG tablet Take 81 mg by mouth daily.    . B Complex-C (B-COMPLEX WITH VITAMIN C) tablet Take 1 tablet by mouth daily.    . hydrochlorothiazide (HYDRODIURIL) 25 MG tablet Take 25 mg by mouth daily.    . naproxen (NAPROSYN) 250 MG tablet Take 1 tablet (250 mg total) by mouth 2 (two) times daily. 16 tablet 0  . Potassium Chloride ER 20 MEQ TBCR TK 1 T PO D  0  . potassium chloride SA (K-DUR,KLOR-CON) 20 MEQ tablet Take 20 mEq by mouth daily.  0   No current facility-administered medications for this visit.     REVIEW OF SYSTEMS:   Constitutional: Denies fevers, chills or abnormal night sweats (+) hot flashes  Eyes: Denies blurriness of vision, double vision or watery eyes Ears, nose, mouth, throat, and face: Denies mucositis or sore throat (+) Hard on hearing  Respiratory: Denies cough, dyspnea or wheezes Cardiovascular: Denies palpitation, chest discomfort or lower extremity swelling Gastrointestinal:  Denies nausea, heartburn or change in bowel habits Skin: Denies abnormal skin rashes Lymphatics: Denies new lymphadenopathy or easy bruising Neurological:Denies numbness, tingling or new weaknesses (+) dementia Behavioral/Psych: Mood is stable, no new changes  Breast:  (+) right breast and arm swelling and skin change, stable All other systems were reviewed with the patient and are negative.  PHYSICAL EXAMINATION: ECOG PERFORMANCE STATUS: 1 - Symptomatic but completely ambulatory  Vitals:   04/13/18 1322  BP: 120/84  Pulse: 70  Resp: 16  Temp: 97.8 F (36.6 C)  SpO2: 99%   Filed Weights   04/13/18 1322  Weight: 180 lb 9.6 oz (81.9 kg)   GENERAL:alert, no distress and comfortable SKIN: skin  color, texture, turgor are normal, no rashes or significant lesions EYES: normal, conjunctiva are pink and non-injected, sclera clear OROPHARYNX:no exudate, no erythema and lips, buccal mucosa, and tongue normal  NECK: supple, thyroid normal size, non-tender, without nodularity LYMPH:  no palpable lymphadenopathy in the cervical or inguinal (+) one palpable non-tender right axillary LN LUNGS: clear to auscultation and percussion with normal breathing effort HEART: regular rate & rhythm (+) 3/6 systolic murmur  ABDOMEN:abdomen soft, non-tender and normal bowel sounds. NO organomegaly  Musculoskeletal:no cyanosis of digits and no clubbing  PSYCH: alert & oriented x 3 with fluent speech NEURO: no focal motor/sensory deficits Breasts: Her right breast is deformed with nipple retraction towards left inner quadrant. There is a 10 x 10 cm mass in the inner lower quadrant of right breast involving the nipple and areolar area, now with small open in inner lower quadrant of right breast, without bleeding and discharge. It is also a little warm to touch See picture below    LABORATORY DATA:  I have reviewed the data as listed Lab Results  Component Value Date   WBC 5.2 03/23/2018   HGB 11.5 (L) 03/23/2018   HCT 35.5 03/23/2018   MCV 93.4 03/23/2018   PLT 296 03/23/2018   Recent Labs    02/04/18 0904 02/18/18 0955 03/23/18 1014  NA 139 141 142  K 3.8 3.6 3.3*  CL 103 104 102  CO2 '28 30 31  '$ GLUCOSE 89 86 87  BUN '13 12 11  '$ CREATININE 0.80 0.77 0.75    CALCIUM 9.1 9.3 9.8  GFRNONAA >60 >60 >60  GFRAA >60 >60 >60  PROT 6.9 7.0 7.5  ALBUMIN 3.6 3.8 3.9  AST '28 30 31  '$ ALT '13 19 15  '$ ALKPHOS 56 53 58  BILITOT 0.6 0.7 0.9    Tumor Marker CA 27.29 Results for Peggy, Welch (MRN [id number]) as of 04/08/2018 17:47  Ref. Range 01/21/2018 09:48 02/18/2018 09:55 03/23/2018 10:14  CA 27.29 Latest Ref Range: 0.0 - 38.6 U/mL 117.7 (H) 121.9 (H) 152.7 (H)    PATHOLOGY REPORT Diagnosis 02/27/2015  1. Breast, right, needle core biopsy - INVASIVE LOBULAR CARCINOMA. - LOBULAR CARCINOMA IN SITU. - SEE COMMENT. 2. Lymph node, needle/core biopsy, right axilla - ONE LYMPH NODE POSITIVE FOR METASTATIC LOBULAR CARCINOMA (1/1). - SEE COMMENT. Microscopic Comment 1. An E-cadherin immunohistochemical stain is performed on the right needle core biopsy. The stain is negative in both the in situ and invasive components confirming the above diagnosis. Although definitive grading of breast carcinoma is best done on excision, the features of the invasive tumor from the right needle core biopsy are compatible with a grade 1-2 breast carcinoma. Breast prognostic markers will be performed and reported in an addendum. Findings are called to [facility] on 03/01/2015. Dr. Lyndon Code has seen the first specimen in consultation with agreement. 2. A cytokeratin AE1/AE3 immunohistochemical stain is performed on the second specimen (right axillary lymph node biopsy). The stain highlights scattered tumor cells confirming the above diagnosis. Dr. Lyndon Code has seen the second specimen in consultation with agreement. A breast prognostic profile will also be performed on the right axillary lymph node and reported in an addendum to follow. (RH:ecj 03/01/2015)  1. PROGNOSTIC INDICATORS - ACIS (BLOCK 1A) Results: IMMUNOHISTOCHEMICAL AND MORPHOMETRIC ANALYSIS BY THE AUTOMATED CELLULAR IMAGING SYSTEM (ACIS) Estrogen Receptor: 95%, POSITIVE, STRONG STAINING INTENSITY (PERFORMED  MANUALLY) Progesterone Receptor: 0%, NEGATIVE, (PERFORMED MANUALLY) Proliferation Marker Ki67: 5% (PERFORMED MANUALLY) COMMENT: The negative hormone receptor  study(ies) in this case has an internal positive control. Results: HER2 - NEGATIVE RATIO OF HER2/CEP17 SIGNALS 1.28 AVERAGE HER2 COPY NUMBER PER CELL 2.30  2. PROGNOSTIC INDICATORS - ACIS (BLOCK 2A) Results: IMMUNOHISTOCHEMICAL AND MORPHOMETRIC ANALYSIS BY THE AUTOMATED CELLULAR IMAGING SYSTEM (ACIS) Estrogen Receptor: 95%, POSITIVE, STRONG STAINING INTENSITY (PERFORMED MANUALLY) Progesterone Receptor: 0%, NEGATIVE, (PERFORMED MANUALLY) Proliferation Marker Ki67: 5% (PERFORMED MANUALLY) COMMENT: The negative hormone receptor study(ies) in this case has no internal positive control. 2. FLUORESCENCE IN-SITU HYBRIDIZATION Results: HER2 - NEGATIVE RATIO OF HER2/CEP17 SIGNALS 1.10 AVERAGE HER2 COPY NUMBER PER CELL 2.20  RADIOGRAPHIC STUDIES: I have personally reviewed the radiological images as listed and agreed with the findings in the report.  12/14/2017 Bone Scan IMPRESSION: Uptake at the lateral LEFT first and lateral RIGHT sixth ribs corresponding to lytic lesions with pathologic fractures by CT.  Multiple sites of degenerative type uptake as above.  Uptake surrounding RIGHT hip and BILATERAL knee prostheses, nonspecific, could be related to surgery if recent, at delayed intervals more suggestive of aseptic loosening or infection.  12/14/2017 CT CAP W Contrast IMPRESSION: 1. Large mass within the right breast. There is parenchymal and trabecular thickening within the right breast as well as skin thickening overlying the right breast. Findings are most compatible with right breast carcinoma, potentially inflammatory carcinoma given the skin thickening. 2. There is abnormal soft tissue within the right axilla concerning for right axillary adenopathy. 3. Bilateral pulmonary nodules concerning for metastatic disease. 4.  Left first and right lateral sixth rib lytic lesions with associated fractures compatible with osseous metastatic disease. 5. Patchy sclerosis within the right ilium concerning for osseous metastatic disease. 6. Indeterminate lobular low attenuation within the stomach. Recommend further evaluation with direct visualization to exclude the possibility of gastric mass. 7. Indeterminate low-attenuation lesions within the right kidney, likely represent cysts with elevated internal attenuation due to quantum mottle artifact. Recommend attention on follow-up exam.  Breast US 12/01/17 IMPRESSION 1. Worsening invasive lobular carcinoma of the right breast with worsening metastatic axillary adenopathy. In addition. Physical exam findings consistent with skin invasion, and concerning for possible invasion of the sternum.  2. The indeterminate left breast calcifications were discussed with the patient and her sister. Given the extent of the disease in the right breast, and possibility of advanced metastatic disease, further work-up of these calcifications will be deferred until after the patient is seen by Dr. Burr Medico. These could be biopsied in the future if it would be helpful for treatment of patient's disease.   Diagnostic Mammogram 12/01/17 IMPRESSION 1. New palpable lump in the upper outer right breast corresponds to a large 5 cm mass in the right axillary tail concerning for worsening conglomerate adenopathy vs spread of patient's known malignancy 2. In addition, increased size of previously biopsied lobular carcinoma in the medial lower right breast with new skin thickening and nipple retraction concerning for new skin involvement. 3. Increased size and number of indeterminate clustered calcifications in the left upper outer breast middle depth.   Mr Breast Bilateral W Wo Contrast  03/08/2015    IMPRESSION: 1. Spiculated biopsy proven malignancy in the lower inner right breast measures 2.1 x 2.5 x 2.3 cm.   2. 6 mm oval mass in the slightly outer right breast at the approximate 9 o'clock position.  3.  Oval enhancing nodules (5-6 mm) in the upper-outer right breast.  4. Enlarged lymph node in the upper-outer right breast compatible with biopsy proven nodal metastases.  5. No MRI evidence of  malignancy in the left breast.   RECOMMENDATION: 1. Second-look ultrasound with biopsy for the 6 mm oval mass in the slightly outer right breast at 9 o'clock.  2. Second-look ultrasound with biopsy for one of the enhancing nodules seen in the upper-outer right breast.  If these nodules cannot be seen via ultrasound, then MRI guided biopsy is recommended if considering breast conservation.  BI-RADS CATEGORY  4: Suspicious.   Electronically Signed   By: Everlean Alstrom M.D.   On: 03/08/2015 12:41    ASSESSMENT & PLAN:  77 y.o.  African-American female, with past medical history of osteoarthritis, status post multiple orthopedic surgeries, but is still completely independent and active.  1. 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  I previously reviewed the natural history of her breast cancer, and the standard treatment option with surgery, which is the only way to cure the cancer. The consequence of a untreated breast cancer, which is likely going to metastasize and becomes incurable in the future, was discussed with her and her sister in great detail. She voiced good understanding -Pt presented back to the clinic on 12/04/17 with obvious clinical and radiographic cancer progression. Her mammogram and [place] on 12/01/17 revealed larger inner lower quadrant breast tumor with skin invasion, and possible sternum invasion on mammogram.  She also has a new palpable mass in the right upper quadrant of right breast, and bulky axillary adenopathy. -Her staging scan showed  bone and lung metastasis.  Given her advanced age, reluctant to do any procedure or treatment, I did not biopsy any metastatic site to confirm.  --She started Anastrozole on 12/23/17 and Ibrance 2 weeks on and one week off.  Due to the significant fatigue, she stopped both medication in the end of June herself, and refused to try again.  -After lengthy discussion on last visit, she agreed to restart anastrozole.  I will continue holding Ibrance for now. -She been tolerating anastrozole well, her breast exam is stable. -She is interested in repeating breast biopsy, I will arrange  In 2-3 weeks -F/u in 2 months with CT CAP and bone scan one week before    2. Osteoporosis  -She takes Fosamax, managed by her PCP Dr. Criss Rosales   3. Goal of care discussion  -We again discussed the incurable nature of her cancer, and the overall poor prognosis, especially if she does not have good response to chemotherapy or progress on chemo -The patient understands the goal of care is palliative.  We also discussed her breast cancer is very treatable, her life expectancy would be several years  -she is full code for now   4. Hypokalemia - She states that she is on KCL 19mq daily.   5. Right arm lymphedema -Due to metastatic adenopathy in the right axilla -Referred her previously to lymphedema physical therapy clinic  Plan -She will continue Anastrozole  [facility], I will call her after the biopsy  -f/u in 2 months with CT CAP and Bone Scan one week before  All questions were answered. The patient knows to call the clinic with any problems, questions or concerns. I spent 20 minutes counseling the patient face to face. The total time spent in the appointment was 25 minutes and more than 50% was on counseling.  IDierdre SearlesDweik  am acting as scribe for Dr. Truitt Merle.  I have reviewed the above documentation for accuracy and completeness, and I agree with the  above.     Truitt Merle, MD 04/13/2018

## 2018-04-13 ENCOUNTER — Inpatient Hospital Stay: Payer: Medicare Other | Attending: Hematology | Admitting: Hematology

## 2018-04-13 ENCOUNTER — Inpatient Hospital Stay: Payer: Medicare Other

## 2018-04-13 ENCOUNTER — Telehealth: Payer: Self-pay

## 2018-04-13 ENCOUNTER — Telehealth: Payer: Self-pay | Admitting: Hematology

## 2018-04-13 ENCOUNTER — Ambulatory Visit: Payer: Medicare Other

## 2018-04-13 ENCOUNTER — Encounter: Payer: Self-pay | Admitting: Hematology

## 2018-04-13 VITALS — BP 120/84 | HR 70 | Temp 97.8°F | Resp 16 | Ht 64.0 in | Wt 180.6 lb

## 2018-04-13 DIAGNOSIS — Z79811 Long term (current) use of aromatase inhibitors: Secondary | ICD-10-CM | POA: Diagnosis not present

## 2018-04-13 DIAGNOSIS — Z7982 Long term (current) use of aspirin: Secondary | ICD-10-CM | POA: Diagnosis not present

## 2018-04-13 DIAGNOSIS — C50311 Malignant neoplasm of lower-inner quadrant of right female breast: Secondary | ICD-10-CM

## 2018-04-13 DIAGNOSIS — C7951 Secondary malignant neoplasm of bone: Secondary | ICD-10-CM | POA: Insufficient documentation

## 2018-04-13 DIAGNOSIS — Z79899 Other long term (current) drug therapy: Secondary | ICD-10-CM | POA: Diagnosis not present

## 2018-04-13 DIAGNOSIS — C7800 Secondary malignant neoplasm of unspecified lung: Secondary | ICD-10-CM | POA: Diagnosis not present

## 2018-04-13 DIAGNOSIS — Z853 Personal history of malignant neoplasm of breast: Secondary | ICD-10-CM | POA: Insufficient documentation

## 2018-04-13 DIAGNOSIS — E876 Hypokalemia: Secondary | ICD-10-CM | POA: Diagnosis not present

## 2018-04-13 DIAGNOSIS — M810 Age-related osteoporosis without current pathological fracture: Secondary | ICD-10-CM | POA: Insufficient documentation

## 2018-04-13 DIAGNOSIS — C773 Secondary and unspecified malignant neoplasm of axilla and upper limb lymph nodes: Secondary | ICD-10-CM | POA: Diagnosis not present

## 2018-04-13 DIAGNOSIS — C50412 Malignant neoplasm of upper-outer quadrant of left female breast: Secondary | ICD-10-CM | POA: Diagnosis not present

## 2018-04-13 DIAGNOSIS — Z170 Estrogen receptor positive status [ER+]: Secondary | ICD-10-CM | POA: Diagnosis not present

## 2018-04-13 DIAGNOSIS — I890 Lymphedema, not elsewhere classified: Secondary | ICD-10-CM | POA: Insufficient documentation

## 2018-04-13 DIAGNOSIS — C50411 Malignant neoplasm of upper-outer quadrant of right female breast: Secondary | ICD-10-CM | POA: Diagnosis not present

## 2018-04-13 LAB — CBC WITH DIFFERENTIAL (CANCER CENTER ONLY)
BASOS PCT: 1 %
Basophils Absolute: 0 10*3/uL (ref 0.0–0.1)
EOS ABS: 0.2 10*3/uL (ref 0.0–0.5)
EOS PCT: 4 %
HCT: 34.9 % (ref 34.8–46.6)
Hemoglobin: 11.3 g/dL — ABNORMAL LOW (ref 11.6–15.9)
Lymphocytes Relative: 42 %
Lymphs Abs: 1.9 10*3/uL (ref 0.9–3.3)
MCH: 30.7 pg (ref 25.1–34.0)
MCHC: 32.4 g/dL (ref 31.5–36.0)
MCV: 94.8 fL (ref 79.5–101.0)
MONO ABS: 0.5 10*3/uL (ref 0.1–0.9)
Monocytes Relative: 12 %
NEUTROS ABS: 1.8 10*3/uL (ref 1.5–6.5)
Neutrophils Relative %: 41 %
PLATELETS: 250 10*3/uL (ref 145–400)
RBC: 3.68 MIL/uL — ABNORMAL LOW (ref 3.70–5.45)
RDW: 15.1 % — AB (ref 11.2–14.5)
WBC Count: 4.4 10*3/uL (ref 3.9–10.3)

## 2018-04-13 LAB — CMP (CANCER CENTER ONLY)
ALT: 15 U/L (ref 0–44)
ANION GAP: 9 (ref 5–15)
AST: 26 U/L (ref 15–41)
Albumin: 3.9 g/dL (ref 3.5–5.0)
Alkaline Phosphatase: 59 U/L (ref 38–126)
BILIRUBIN TOTAL: 0.5 mg/dL (ref 0.3–1.2)
BUN: 12 mg/dL (ref 8–23)
CHLORIDE: 103 mmol/L (ref 98–111)
CO2: 30 mmol/L (ref 22–32)
Calcium: 9.2 mg/dL (ref 8.9–10.3)
Creatinine: 0.76 mg/dL (ref 0.44–1.00)
Glucose, Bld: 91 mg/dL (ref 70–99)
POTASSIUM: 3.6 mmol/L (ref 3.5–5.1)
Sodium: 142 mmol/L (ref 135–145)
TOTAL PROTEIN: 7.3 g/dL (ref 6.5–8.1)

## 2018-04-13 NOTE — Telephone Encounter (Signed)
Patient scheduled per 8/20 sch message.  °

## 2018-04-13 NOTE — Telephone Encounter (Signed)
Printed avs and calender of upcoming appointment. Per 8/20 los 

## 2018-04-15 ENCOUNTER — Encounter: Payer: Self-pay | Admitting: Hematology

## 2018-04-18 DIAGNOSIS — J449 Chronic obstructive pulmonary disease, unspecified: Secondary | ICD-10-CM | POA: Diagnosis not present

## 2018-05-19 DIAGNOSIS — J449 Chronic obstructive pulmonary disease, unspecified: Secondary | ICD-10-CM | POA: Diagnosis not present

## 2018-06-01 ENCOUNTER — Other Ambulatory Visit: Payer: Medicare Other

## 2018-06-04 ENCOUNTER — Ambulatory Visit (HOSPITAL_COMMUNITY): Payer: Medicare Other

## 2018-06-04 ENCOUNTER — Encounter (HOSPITAL_COMMUNITY): Payer: Medicare Other

## 2018-06-08 ENCOUNTER — Ambulatory Visit (HOSPITAL_COMMUNITY): Admission: RE | Admit: 2018-06-08 | Payer: Medicare Other | Source: Ambulatory Visit

## 2018-06-08 ENCOUNTER — Telehealth: Payer: Self-pay | Admitting: Hematology

## 2018-06-08 NOTE — Telephone Encounter (Signed)
CT Scan was canceled, Pt. Must call CT to reschedule appointment.

## 2018-06-09 ENCOUNTER — Ambulatory Visit: Payer: Medicare Other | Admitting: Hematology

## 2018-06-18 DIAGNOSIS — D529 Folate deficiency anemia, unspecified: Secondary | ICD-10-CM | POA: Diagnosis not present

## 2018-06-18 DIAGNOSIS — C50112 Malignant neoplasm of central portion of left female breast: Secondary | ICD-10-CM | POA: Diagnosis not present

## 2018-06-18 DIAGNOSIS — J449 Chronic obstructive pulmonary disease, unspecified: Secondary | ICD-10-CM | POA: Diagnosis not present

## 2018-06-18 DIAGNOSIS — D6489 Other specified anemias: Secondary | ICD-10-CM | POA: Diagnosis not present

## 2018-06-18 DIAGNOSIS — I10 Essential (primary) hypertension: Secondary | ICD-10-CM | POA: Diagnosis not present

## 2018-06-21 ENCOUNTER — Encounter (HOSPITAL_COMMUNITY): Payer: Medicare Other

## 2018-06-21 ENCOUNTER — Ambulatory Visit (HOSPITAL_COMMUNITY): Payer: Medicare Other

## 2018-07-19 DIAGNOSIS — J449 Chronic obstructive pulmonary disease, unspecified: Secondary | ICD-10-CM | POA: Diagnosis not present

## 2018-07-20 ENCOUNTER — Other Ambulatory Visit: Payer: Self-pay | Admitting: Hematology

## 2018-08-18 DIAGNOSIS — J449 Chronic obstructive pulmonary disease, unspecified: Secondary | ICD-10-CM | POA: Diagnosis not present

## 2018-08-21 ENCOUNTER — Other Ambulatory Visit: Payer: Self-pay | Admitting: Hematology

## 2018-08-23 ENCOUNTER — Other Ambulatory Visit: Payer: Self-pay | Admitting: Hematology

## 2018-09-18 DIAGNOSIS — J449 Chronic obstructive pulmonary disease, unspecified: Secondary | ICD-10-CM | POA: Diagnosis not present

## 2018-09-20 DIAGNOSIS — H6123 Impacted cerumen, bilateral: Secondary | ICD-10-CM | POA: Diagnosis not present

## 2018-09-24 DIAGNOSIS — H6121 Impacted cerumen, right ear: Secondary | ICD-10-CM | POA: Diagnosis not present

## 2018-09-24 DIAGNOSIS — I10 Essential (primary) hypertension: Secondary | ICD-10-CM | POA: Diagnosis not present

## 2018-10-15 DIAGNOSIS — D649 Anemia, unspecified: Secondary | ICD-10-CM | POA: Diagnosis not present

## 2018-10-15 DIAGNOSIS — H902 Conductive hearing loss, unspecified: Secondary | ICD-10-CM | POA: Diagnosis not present

## 2018-10-15 DIAGNOSIS — C50112 Malignant neoplasm of central portion of left female breast: Secondary | ICD-10-CM | POA: Diagnosis not present

## 2018-10-15 DIAGNOSIS — I10 Essential (primary) hypertension: Secondary | ICD-10-CM | POA: Diagnosis not present

## 2018-10-15 DIAGNOSIS — D529 Folate deficiency anemia, unspecified: Secondary | ICD-10-CM | POA: Diagnosis not present

## 2018-10-19 DIAGNOSIS — J449 Chronic obstructive pulmonary disease, unspecified: Secondary | ICD-10-CM | POA: Diagnosis not present

## 2018-10-26 ENCOUNTER — Ambulatory Visit (INDEPENDENT_AMBULATORY_CARE_PROVIDER_SITE_OTHER): Payer: Medicare Other | Admitting: Podiatry

## 2018-10-26 ENCOUNTER — Encounter: Payer: Self-pay | Admitting: Podiatry

## 2018-10-26 DIAGNOSIS — M79674 Pain in right toe(s): Secondary | ICD-10-CM | POA: Diagnosis not present

## 2018-10-26 DIAGNOSIS — M79675 Pain in left toe(s): Secondary | ICD-10-CM

## 2018-10-26 DIAGNOSIS — B351 Tinea unguium: Secondary | ICD-10-CM | POA: Diagnosis not present

## 2018-10-26 NOTE — Progress Notes (Signed)
Subjective:    Patient ID: Peggy Welch, female    DOB: 24-Aug-1941, 78 y.o.   MRN: [id number]  HPI 78 year old female presents the office today for concerns of thick, painful, elongated toenails that she cannot trim himself and they are causing pain with shoes.  She denies any redness or drainage or any swelling to the toenail sites.  She states that she had an injury to her right big toe several years ago and the nail was taken off but it grew back and thick.  She has no other concerns today.  This is been a chronic issue for her.   Review of Systems  All other systems reviewed and are negative.  Past Medical History:  Diagnosis Date  . Arthritis   . Asthma   . Asthma exacerbation 10/14/2011  . Blood transfusion without reported diagnosis   . Hepatitis C    from blood transfusion  . Hypertension   . Pneumonia 4/02016  . rt breast ca dx'd 2019    Past Surgical History:  Procedure Laterality Date  . CHOLECYSTECTOMY  02/21/2002  . JOINT REPLACEMENT     Bilateral total knee replacements  . knee replacements Bilateral   . Right Rotator Cuff Repair  08/12/2006  . TOTAL HIP ARTHROPLASTY Right 11/22/2014   dr Lorin Mercy  . TOTAL HIP ARTHROPLASTY Right 11/22/2014   Procedure: TOTAL HIP ARTHROPLASTY ANTERIOR APPROACH;  Surgeon: Marybelle Killings, MD;  Location: [facility];  Service: Orthopedics;  Laterality: Right;     Current Outpatient Medications:  .  acetaminophen (TYLENOL) 650 MG CR tablet, Take 650 mg by mouth every 8 (eight) hours as needed for pain (pain)., Disp: , Rfl:  .  albuterol (PROVENTIL) (2.5 MG/3ML) 0.083% nebulizer solution, Take 2.5 mg by nebulization every 4 (four) hours as needed for wheezing or shortness of breath (wheezing)., Disp: , Rfl:  .  alendronate (FOSAMAX) 70 MG tablet, Take 70 mg by mouth every 7 (seven) days. On Tuesdays. Take with a full glass of water on an empty stomach., Disp: , Rfl:  .  anastrozole (ARIMIDEX) 1 MG tablet, TAKE 1 TABLET(1 MG) BY MOUTH DAILY,  Disp: 90 tablet, Rfl: 1 .  aspirin 81 MG tablet, Take 81 mg by mouth daily., Disp: , Rfl:  .  Aspirin-Calcium Carbonate 81-777 MG TABS, Take by mouth., Disp: , Rfl:  .  B Complex-C (B-COMPLEX WITH VITAMIN C) tablet, Take 1 tablet by mouth daily., Disp: , Rfl:  .  hydrochlorothiazide (HYDRODIURIL) 25 MG tablet, Take 25 mg by mouth daily., Disp: , Rfl:  .  naproxen (NAPROSYN) 250 MG tablet, Take 1 tablet (250 mg total) by mouth 2 (two) times daily., Disp: 16 tablet, Rfl: 0 .  Potassium Chloride ER 20 MEQ TBCR, TK 1 T PO D, Disp: , Rfl: 0 .  potassium chloride SA (K-DUR,KLOR-CON) 20 MEQ tablet, Take 20 mEq by mouth daily., Disp: , Rfl: 0  Allergies  Allergen Reactions  . 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 No surrounding redness or drainage. Tenderness nails 1-5 bilaterally. No open lesions or pre-ulcerative lesions are identified today.  Vascular: Dorsalis Pedis artery and Posterior Tibial artery pedal pulses are palpable bilateral with immedate capillary fill time. There is no pain with calf compression, swelling, warmth, erythema.   Neruologic: Grossly intact via light touch bilateral.  Musculoskeletal: No gross boney pedal deformities bilateral. No pain, crepitus, or limitation noted with foot and ankle range of motion bilateral. Muscular strength 5/5 in all groups tested bilateral.     Assessment & Plan:  78 year old female with symptomatic onychomycosis -Treatment options discussed including all alternatives, risks, and complications -Etiology of symptoms were discussed -Nails debrided 10 without complications or bleeding. -Daily foot inspection -Follow-up in 3 months or sooner if any problems arise. In the meantime, encouraged to call the office with any questions, concerns, change in symptoms.   Celesta Gentile, DPM

## 2018-11-17 DIAGNOSIS — J449 Chronic obstructive pulmonary disease, unspecified: Secondary | ICD-10-CM | POA: Diagnosis not present

## 2018-12-18 DIAGNOSIS — J449 Chronic obstructive pulmonary disease, unspecified: Secondary | ICD-10-CM | POA: Diagnosis not present

## 2019-01-17 DIAGNOSIS — J449 Chronic obstructive pulmonary disease, unspecified: Secondary | ICD-10-CM | POA: Diagnosis not present

## 2019-01-25 ENCOUNTER — Ambulatory Visit: Payer: Medicare Other | Admitting: Podiatry

## 2019-02-17 DIAGNOSIS — J449 Chronic obstructive pulmonary disease, unspecified: Secondary | ICD-10-CM | POA: Diagnosis not present

## 2019-02-18 DIAGNOSIS — D649 Anemia, unspecified: Secondary | ICD-10-CM | POA: Diagnosis not present

## 2019-02-18 DIAGNOSIS — H902 Conductive hearing loss, unspecified: Secondary | ICD-10-CM | POA: Diagnosis not present

## 2019-02-18 DIAGNOSIS — I10 Essential (primary) hypertension: Secondary | ICD-10-CM | POA: Diagnosis not present

## 2019-03-10 DIAGNOSIS — J449 Chronic obstructive pulmonary disease, unspecified: Secondary | ICD-10-CM | POA: Diagnosis not present

## 2019-03-10 DIAGNOSIS — Z0000 Encounter for general adult medical examination without abnormal findings: Secondary | ICD-10-CM | POA: Diagnosis not present

## 2019-03-19 DIAGNOSIS — J449 Chronic obstructive pulmonary disease, unspecified: Secondary | ICD-10-CM | POA: Diagnosis not present

## 2019-03-22 ENCOUNTER — Other Ambulatory Visit: Payer: Self-pay

## 2019-03-22 ENCOUNTER — Emergency Department (HOSPITAL_COMMUNITY): Payer: Medicare Other

## 2019-03-22 ENCOUNTER — Inpatient Hospital Stay (HOSPITAL_COMMUNITY)
Admission: EM | Admit: 2019-03-22 | Discharge: 2019-03-26 | DRG: 640 | Disposition: A | Discharge status: Home / Self Care | Payer: Medicare Other | Attending: Family Medicine | Admitting: Family Medicine

## 2019-03-22 ENCOUNTER — Encounter (HOSPITAL_COMMUNITY): Payer: Self-pay

## 2019-03-22 DIAGNOSIS — T502X5A Adverse effect of carbonic-anhydrase inhibitors, benzothiadiazides and other diuretics, initial encounter: Secondary | ICD-10-CM | POA: Diagnosis present

## 2019-03-22 DIAGNOSIS — I2699 Other pulmonary embolism without acute cor pulmonale: Secondary | ICD-10-CM

## 2019-03-22 DIAGNOSIS — C7951 Secondary malignant neoplasm of bone: Secondary | ICD-10-CM | POA: Diagnosis present

## 2019-03-22 DIAGNOSIS — H9190 Unspecified hearing loss, unspecified ear: Secondary | ICD-10-CM | POA: Diagnosis not present

## 2019-03-22 DIAGNOSIS — Z170 Estrogen receptor positive status [ER+]: Secondary | ICD-10-CM | POA: Diagnosis not present

## 2019-03-22 DIAGNOSIS — I10 Essential (primary) hypertension: Secondary | ICD-10-CM | POA: Diagnosis present

## 2019-03-22 DIAGNOSIS — Z20828 Contact with and (suspected) exposure to other viral communicable diseases: Secondary | ICD-10-CM | POA: Diagnosis present

## 2019-03-22 DIAGNOSIS — Z79899 Other long term (current) drug therapy: Secondary | ICD-10-CM

## 2019-03-22 DIAGNOSIS — R918 Other nonspecific abnormal finding of lung field: Secondary | ICD-10-CM | POA: Diagnosis not present

## 2019-03-22 DIAGNOSIS — C50112 Malignant neoplasm of central portion of left female breast: Secondary | ICD-10-CM | POA: Diagnosis not present

## 2019-03-22 DIAGNOSIS — C50311 Malignant neoplasm of lower-inner quadrant of right female breast: Secondary | ICD-10-CM | POA: Diagnosis not present

## 2019-03-22 DIAGNOSIS — Z96653 Presence of artificial knee joint, bilateral: Secondary | ICD-10-CM | POA: Diagnosis present

## 2019-03-22 DIAGNOSIS — R05 Cough: Secondary | ICD-10-CM | POA: Diagnosis not present

## 2019-03-22 DIAGNOSIS — Z9119 Patient's noncompliance with other medical treatment and regimen: Secondary | ICD-10-CM

## 2019-03-22 DIAGNOSIS — H902 Conductive hearing loss, unspecified: Secondary | ICD-10-CM | POA: Diagnosis not present

## 2019-03-22 DIAGNOSIS — Z91013 Allergy to seafood: Secondary | ICD-10-CM | POA: Diagnosis not present

## 2019-03-22 DIAGNOSIS — Z515 Encounter for palliative care: Secondary | ICD-10-CM | POA: Diagnosis not present

## 2019-03-22 DIAGNOSIS — Z9889 Other specified postprocedural states: Secondary | ICD-10-CM

## 2019-03-22 DIAGNOSIS — Z9049 Acquired absence of other specified parts of digestive tract: Secondary | ICD-10-CM | POA: Diagnosis not present

## 2019-03-22 DIAGNOSIS — C50911 Malignant neoplasm of unspecified site of right female breast: Secondary | ICD-10-CM | POA: Diagnosis not present

## 2019-03-22 DIAGNOSIS — I2694 Multiple subsegmental pulmonary emboli without acute cor pulmonale: Secondary | ICD-10-CM

## 2019-03-22 DIAGNOSIS — J90 Pleural effusion, not elsewhere classified: Secondary | ICD-10-CM | POA: Diagnosis not present

## 2019-03-22 DIAGNOSIS — R0902 Hypoxemia: Secondary | ICD-10-CM | POA: Diagnosis not present

## 2019-03-22 DIAGNOSIS — R1310 Dysphagia, unspecified: Secondary | ICD-10-CM

## 2019-03-22 DIAGNOSIS — Z66 Do not resuscitate: Secondary | ICD-10-CM | POA: Diagnosis not present

## 2019-03-22 DIAGNOSIS — R846 Abnormal cytological findings in specimens from respiratory organs and thorax: Secondary | ICD-10-CM | POA: Diagnosis not present

## 2019-03-22 DIAGNOSIS — C384 Malignant neoplasm of pleura: Secondary | ICD-10-CM | POA: Diagnosis not present

## 2019-03-22 DIAGNOSIS — I890 Lymphedema, not elsewhere classified: Secondary | ICD-10-CM | POA: Diagnosis present

## 2019-03-22 DIAGNOSIS — C7801 Secondary malignant neoplasm of right lung: Secondary | ICD-10-CM | POA: Diagnosis present

## 2019-03-22 DIAGNOSIS — J910 Malignant pleural effusion: Secondary | ICD-10-CM | POA: Diagnosis present

## 2019-03-22 DIAGNOSIS — E876 Hypokalemia: Secondary | ICD-10-CM | POA: Diagnosis present

## 2019-03-22 DIAGNOSIS — J9811 Atelectasis: Secondary | ICD-10-CM | POA: Diagnosis not present

## 2019-03-22 DIAGNOSIS — C7802 Secondary malignant neoplasm of left lung: Secondary | ICD-10-CM | POA: Diagnosis present

## 2019-03-22 DIAGNOSIS — Z7982 Long term (current) use of aspirin: Secondary | ICD-10-CM | POA: Diagnosis not present

## 2019-03-22 DIAGNOSIS — J9691 Respiratory failure, unspecified with hypoxia: Secondary | ICD-10-CM | POA: Diagnosis not present

## 2019-03-22 DIAGNOSIS — J45909 Unspecified asthma, uncomplicated: Secondary | ICD-10-CM | POA: Diagnosis not present

## 2019-03-22 DIAGNOSIS — Z96643 Presence of artificial hip joint, bilateral: Secondary | ICD-10-CM | POA: Diagnosis present

## 2019-03-22 DIAGNOSIS — Z7189 Other specified counseling: Secondary | ICD-10-CM | POA: Diagnosis not present

## 2019-03-22 LAB — COMPREHENSIVE METABOLIC PANEL
ALT: 30 U/L (ref 0–44)
AST: 59 U/L — ABNORMAL HIGH (ref 15–41)
Albumin: 4.1 g/dL (ref 3.5–5.0)
Alkaline Phosphatase: 71 U/L (ref 38–126)
Anion gap: 12 (ref 5–15)
BUN: 5 mg/dL — ABNORMAL LOW (ref 8–23)
CO2: 33 mmol/L — ABNORMAL HIGH (ref 22–32)
Calcium: 13.6 mg/dL (ref 8.9–10.3)
Chloride: 89 mmol/L — ABNORMAL LOW (ref 98–111)
Creatinine, Ser: 0.64 mg/dL (ref 0.44–1.00)
GFR calc Af Amer: >60 mL/min (ref >60)
GFR calc non Af Amer: >60 mL/min (ref >60)
Glucose, Bld: 101 mg/dL — ABNORMAL HIGH (ref 70–99)
Potassium: 2.5 mmol/L — CL (ref 3.5–5.1)
Sodium: 134 mmol/L — ABNORMAL LOW (ref 135–145)
Total Bilirubin: 1.3 mg/dL — ABNORMAL HIGH (ref 0.3–1.2)
Total Protein: 7.9 g/dL (ref 6.5–8.1)

## 2019-03-22 LAB — CBC WITH DIFFERENTIAL/PLATELET
Abs Immature Granulocytes: 0.02 10*3/uL (ref 0.00–0.07)
Basophils Absolute: 0 10*3/uL (ref 0.0–0.1)
Basophils Relative: 1 %
Eosinophils Absolute: 0.1 10*3/uL (ref 0.0–0.5)
Eosinophils Relative: 2 %
HCT: 42.3 % (ref 36.0–46.0)
Hemoglobin: 13.5 g/dL (ref 12.0–15.0)
Immature Granulocytes: 0 %
Lymphocytes Relative: 29 %
Lymphs Abs: 1.6 10*3/uL (ref 0.7–4.0)
MCH: 30.2 pg (ref 26.0–34.0)
MCHC: 31.9 g/dL (ref 30.0–36.0)
MCV: 94.6 fL (ref 80.0–100.0)
Monocytes Absolute: 0.6 10*3/uL (ref 0.1–1.0)
Monocytes Relative: 11 %
Neutro Abs: 3.3 10*3/uL (ref 1.7–7.7)
Neutrophils Relative %: 57 %
Platelets: 357 10*3/uL (ref 150–400)
RBC: 4.47 MIL/uL (ref 3.87–5.11)
RDW: 14 % (ref 11.5–15.5)
WBC: 5.8 10*3/uL (ref 4.0–10.5)
nRBC: 0 % (ref 0.0–0.2)

## 2019-03-22 LAB — MAGNESIUM: Magnesium: 2 mg/dL (ref 1.7–2.4)

## 2019-03-22 LAB — APTT: aPTT: 29 seconds (ref 24–36)

## 2019-03-22 LAB — PROTIME-INR
INR: 1.1 (ref 0.8–1.2)
Prothrombin Time: 14 seconds (ref 11.4–15.2)

## 2019-03-22 LAB — PHOSPHORUS: Phosphorus: 2 mg/dL — ABNORMAL LOW (ref 2.5–4.6)

## 2019-03-22 LAB — SARS CORONAVIRUS 2 BY RT PCR (HOSPITAL ORDER, PERFORMED IN ~~LOC~~ HOSPITAL LAB): SARS Coronavirus 2: NEGATIVE

## 2019-03-22 MED ORDER — HEPARIN BOLUS VIA INFUSION
3000.0000 [IU] | Freq: Once | INTRAVENOUS | Status: AC
  Administered 2019-03-22: 23:00:00 3000 [IU] via INTRAVENOUS
  Filled 2019-03-22: qty 3000

## 2019-03-22 MED ORDER — ACETAMINOPHEN 650 MG RE SUPP: 650.0000 mg | Freq: Four times a day (QID) | RECTAL | Status: DC | PRN

## 2019-03-22 MED ORDER — POTASSIUM CHLORIDE 10 MEQ/100ML IV SOLN
10.0000 meq | INTRAVENOUS | Status: AC
  Administered 2019-03-22 (×4): 10 meq via INTRAVENOUS
  Filled 2019-03-22 (×4): qty 100

## 2019-03-22 MED ORDER — HEPARIN (PORCINE) 25000 UT/250ML-% IV SOLN
1000.0000 [IU]/h | INTRAVENOUS | Status: DC
  Administered 2019-03-22 – 2019-03-26 (×5): 1000 [IU]/h via INTRAVENOUS
  Filled 2019-03-22 (×4): qty 250

## 2019-03-22 MED ORDER — ACETAMINOPHEN 325 MG PO TABS: 650.0000 mg | ORAL_TABLET | Freq: Four times a day (QID) | ORAL | Status: DC | PRN

## 2019-03-22 MED ORDER — ONDANSETRON HCL 4 MG PO TABS: 4.0000 mg | ORAL_TABLET | Freq: Four times a day (QID) | ORAL | Status: DC | PRN

## 2019-03-22 MED ORDER — IOHEXOL 350 MG/ML SOLN
75.0000 mL | Freq: Once | INTRAVENOUS | Status: AC | PRN
  Administered 2019-03-22: 100 mL via INTRAVENOUS

## 2019-03-22 MED ORDER — POTASSIUM CHLORIDE IN NACL 20-0.9 MEQ/L-% IV SOLN
INTRAVENOUS | Status: AC
  Administered 2019-03-23: 02:00:00 via INTRAVENOUS
  Filled 2019-03-22 (×2): qty 1000

## 2019-03-22 MED ORDER — ALBUTEROL SULFATE (2.5 MG/3ML) 0.083% IN NEBU
2.5000 mg | INHALATION_SOLUTION | RESPIRATORY_TRACT | Status: DC | PRN
  Administered 2019-03-24: 2.5 mg via RESPIRATORY_TRACT
  Filled 2019-03-22: qty 3

## 2019-03-22 MED ORDER — SODIUM CHLORIDE (PF) 0.9 % IJ SOLN
INTRAMUSCULAR | Status: AC
  Administered 2019-03-23
  Filled 2019-03-22: qty 50

## 2019-03-22 MED ORDER — SODIUM CHLORIDE 0.9 % IV BOLUS
1000.0000 mL | Freq: Once | INTRAVENOUS | Status: AC
  Administered 2019-03-22: 19:00:00 1000 mL via INTRAVENOUS

## 2019-03-22 MED ORDER — POTASSIUM CHLORIDE CRYS ER 20 MEQ PO TBCR
40.0000 meq | EXTENDED_RELEASE_TABLET | Freq: Once | ORAL | Status: AC
  Administered 2019-03-22: 20:00:00 40 meq via ORAL
  Filled 2019-03-22: qty 2

## 2019-03-22 MED ORDER — ONDANSETRON HCL 4 MG/2ML IJ SOLN: 4.0000 mg | Freq: Four times a day (QID) | INTRAMUSCULAR | Status: DC | PRN

## 2019-03-22 MED ORDER — HYDRALAZINE HCL 20 MG/ML IJ SOLN: 5.0000 mg | Freq: Four times a day (QID) | INTRAMUSCULAR | Status: DC | PRN

## 2019-03-22 MED ORDER — SENNOSIDES-DOCUSATE SODIUM 8.6-50 MG PO TABS: 1.0000 | ORAL_TABLET | Freq: Every evening | ORAL | Status: DC | PRN

## 2019-03-22 MED ORDER — K PHOS MONO-SOD PHOS DI & MONO 155-852-130 MG PO TABS
500.0000 mg | ORAL_TABLET | Freq: Two times a day (BID) | ORAL | Status: AC
  Administered 2019-03-23: 500 mg via ORAL
  Filled 2019-03-22 (×2): qty 2

## 2019-03-22 MED ORDER — SODIUM CHLORIDE 0.9% FLUSH
3.0000 mL | Freq: Two times a day (BID) | INTRAVENOUS | Status: DC
  Administered 2019-03-23 – 2019-03-26 (×3): 3 mL via INTRAVENOUS

## 2019-03-22 NOTE — ED Triage Notes (Signed)
Patient sent over from PCP for difficulty swallowing, productive coughing, and low 02.   95% RA in triage.  Denies chest pain or shob   Hx. Asthma    A/Ox4 Ambulatory from wheelchair to chair.

## 2019-03-22 NOTE — ED Provider Notes (Signed)
[facility] DEPT Provider Note   CSN: [id number] Arrival date & time: 03/22/19  1614    History   Chief Complaint Chief Complaint  Patient presents with   Low 02   Cough    HPI Peggy Welch is a 78 y.o. female.     HPI   Presents with shortness of breath and cough, present for about one week  O2 levels lower than usual but still above 90% just for a day or two Dr. Criss Rosales sent to ED because heard something on her lungs No hemoptysis, no fevers Coughing up white mucus Last Tues saw physician, got albuterol inhaler but cough worsening No pain or swelling or legs No chest pain    Past Medical History:  Diagnosis Date   Arthritis    Asthma    Asthma exacerbation 10/14/2011   Blood transfusion without reported diagnosis    Hepatitis C    from blood transfusion   Hypertension    Pneumonia 4/02016   rt breast ca dx'd 2019    Patient Active Problem List   Diagnosis Date Noted   Hypercalcemia of malignancy 03/22/2019   Pulmonary embolism ([place]) 03/22/2019   Dysphagia 03/22/2019   Goals of care, counseling/discussion 12/22/2017   Breast cancer of lower-inner quadrant of right female breast ([place]) 03/05/2015   Postoperative anemia due to acute blood loss 12/01/2014   Cough 12/01/2014   HCAP (healthcare-associated pneumonia) 11/30/2014   Right lower lobe pneumonia ([place]) 11/30/2014   Sepsis ([place]) 11/30/2014   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      Home Medications    Prior to Admission medications   Medication Sig Start Date End Date Taking? Authorizing Provider  acetaminophen (TYLENOL) 650 MG CR tablet Take 650 mg by mouth every 8 (eight) hours as needed for pain (pain).   Yes [provider]  albuterol (PROVENTIL) (2.5 MG/3ML) 0.083% nebulizer solution Take 2.5 mg by nebulization every 4 (four) hours as needed for wheezing or shortness of breath (wheezing).   Yes [provider]  aspirin 81 MG tablet Take 81 mg by mouth daily.   Yes [provider]  Aspirin-Calcium Carbonate 81-777 MG TABS Take 1 tablet by mouth daily as needed (headache).    Yes [provider]  B Complex-C (B-COMPLEX WITH VITAMIN C) tablet Take 1 tablet by mouth daily.   Yes [provider]  hydrochlorothiazide (HYDRODIURIL) 25 MG tablet Take 25 mg by mouth daily.   Yes [provider]  naproxen (NAPROSYN) 250 MG tablet Take 1 tablet (250 mg total) by mouth 2 (two) times daily. Patient taking differently: Take 250 mg by mouth daily as needed for headache.  07/09/16  Yes [name], MD  Potassium Chloride ER 20 MEQ TBCR Take 20 mEq by mouth daily.  01/21/17  Yes [provider]  anastrozole (ARIMIDEX) 1 MG tablet TAKE  1 TABLET(1 MG) BY MOUTH DAILY Patient not taking: No sig reported 08/23/18   Truitt Merle, MD  citalopram (CELEXA) 10 MG tablet Take 10 mg by mouth every morning. 03/10/19   [provider]    Family History Family History  Problem Relation Age of Onset   Hypertension Sister     Social History Social History   Tobacco Use   Smoking status: Never Smoker   Smokeless tobacco: Never Used  Substance Use Topics   Alcohol use: No   Drug use: No     Allergies   Shrimp [shellfish allergy]   Review of Systems Review of Systems  Constitutional: Positive for fatigue.  Negative for fever.  HENT: Negative for sore throat.   Eyes: Negative for visual disturbance.  Respiratory: Positive for cough and shortness of breath.   Cardiovascular: Negative for chest pain.  Gastrointestinal: Negative for abdominal pain, nausea and vomiting.  Genitourinary: Negative for difficulty urinating.  Musculoskeletal: Negative for back pain and neck pain.  Skin: Negative for rash.  Neurological: Negative for syncope and headaches.     Physical Exam Updated Vital Signs BP 124/69 (BP Location: Left Arm)    Pulse 92    Temp 98.6 F (37 C) (Oral)    Resp 20    Ht 5\' 4"  (1.626 m)    Wt 69.8 kg    SpO2 92%    BMI 26.41 kg/m   Physical Exam Vitals signs and nursing note reviewed.  Constitutional:      General: She is not in acute distress.    Appearance: She is well-developed. She is not diaphoretic.  HENT:     Head: Normocephalic and atraumatic.  Eyes:     Conjunctiva/sclera: Conjunctivae normal.  Neck:     Musculoskeletal: Normal range of motion.  Cardiovascular:     Rate and Rhythm: Normal rate and regular rhythm.     Heart sounds: Normal heart sounds. No murmur. No friction rub. No gallop.   Pulmonary:     Effort: Pulmonary effort is normal. No respiratory distress.     Breath sounds: Normal breath sounds. No wheezing.     Comments: Diminished BS at the bases Abdominal:     General: There is no distension.     Palpations: Abdomen is soft.     Tenderness: There is no abdominal tenderness. There is no guarding.  Musculoskeletal:        General: No tenderness.  Skin:    General: Skin is warm and dry.     Findings: No erythema or rash.  Neurological:     Mental Status: She is alert and oriented to person, place, and time.      ED Treatments / Results  Labs (all labs ordered are listed, but only abnormal results are displayed) Labs Reviewed  COMPREHENSIVE METABOLIC PANEL - Abnormal; Notable for the following components:      Result Value   Sodium 134 (*)     Potassium 2.5 (*)    Chloride 89 (*)    CO2 33 (*)    Glucose, Bld 101 (*)    BUN 5 (*)    Calcium 13.6 (*)    AST 59 (*)    Total Bilirubin 1.3 (*)    All other components within normal limits  PHOSPHORUS - Abnormal; Notable for the following components:   Phosphorus 2.0 (*)    All other components within normal limits  RENAL FUNCTION PANEL - Abnormal; Notable for the following components:   Potassium  3.0 (*)    BUN 5 (*)    Calcium 11.6 (*)    All other components within normal limits  SARS CORONAVIRUS 2 (HOSPITAL ORDER, [facility])  CBC WITH DIFFERENTIAL/PLATELET  MAGNESIUM  PROTIME-INR  APTT  HEPARIN LEVEL (UNFRACTIONATED)  CBC  HEPARIN LEVEL (UNFRACTIONATED)    EKG EKG Interpretation  Date/Time:  Tuesday March 22 2019 18:30:53 EDT Ventricular Rate:  89 PR Interval:    QRS Duration: 132 QT Interval:  377 QTC Calculation: 459 R Axis:   -25 Text Interpretation:  Sinus rhythm IVCD, consider atypical LBBB low voltge compard with prior 3/16 Confirmed by Aletta Edouard [id number]) on 03/23/2019 12:16:17 PM   Radiology Dg Chest 2 View  Result Date: 03/22/2019 CLINICAL DATA:  78 y.o female sent over from PCP for difficulty swallowing, productive coughing, and low 02. 95% RA in triage. Denies chest pain or shob. Hx. Asthma, HTN, PNA, and rt breast cancer EXAM: CHEST - 2 VIEW COMPARISON:  Chest CT, 12/14/2017. Chest radiographs, 11/30/2014 and earlier. FINDINGS: Moderate bilateral pleural effusions obscure the hemidiaphragms and most of the heart borders. There is additional lung base opacity consistent with atelectasis. Remainder of the lungs is clear. No pneumothorax. Cardiac silhouette is top-normal in size. No mediastinal or hilar masses. Skeletal structures are intact. IMPRESSION: 1. Moderate bilateral pleural effusions with associated basilar atelectasis. Cannot exclude underlying pneumonia. No evidence of pulmonary edema. Electronically Signed    By: Lajean Manes M.D.   On: 03/22/2019 17:42   Ct Angio Chest Pe W And/or Wo Contrast  Result Date: 03/22/2019 CLINICAL DATA:  Difficulty swallowing, productive cough, hypoxia. History of breast cancer. EXAM: CT ANGIOGRAPHY CHEST WITH CONTRAST TECHNIQUE: Multidetector CT imaging of the chest was performed using the standard protocol during bolus administration of intravenous contrast. Multiplanar CT image reconstructions and MIPs were obtained to evaluate the vascular anatomy. CONTRAST:  152mL OMNIPAQUE IOHEXOL 350 MG/ML SOLN COMPARISON:  Chest radiographs dated 03/22/2019. CT chest dated 12/14/2017. FINDINGS: Cardiovascular: Satisfactory opacification of the bilateral pulmonary arteries to the lobar level. Lobar/segmental pulmonary embolism within a branch of the right lower lobe pulmonary artery (series 8/image 117). Overall clot burden is small. No evidence of right heart strain. No evidence of thoracic aortic aneurysm or dissection. Mild atherosclerotic calcifications of the aortic arch. The heart is normal in size.  Trace pericardial fluid. Mediastinum/Nodes: No suspicious mediastinal lymphadenopathy. Visualized thyroid is unremarkable. Lungs/Pleura: Evaluation of the lung parenchyma is constrained by respiratory motion. Within that constraint, there are no suspicious pulmonary nodules. Suspected mild perihilar edema in the bilateral upper lobes. Moderate to large bilateral pleural effusions. Associated areas of scattered atelectasis in the bilateral upper lobes with dependent atelectasis in the bilateral lower lobes. No pneumothorax. Upper Abdomen: Visualized upper abdomen is notable for prior cholecystectomy. Musculoskeletal: Abnormal soft tissue throughout the right breast and extending to the right axilla with involvement of the right pectoralis musculature (series 7/image 27), measuring approximately 4.5 x 14.5 cm in axial dimension. While some of this appearance may reflect radiation change, the  overall appearance is considered concerning for infiltrating tumor which has progressed from the prior. Overlying skin thickening. Degenerative changes of the visualized thoracolumbar spine. Review of the MIP images confirms the above findings. IMPRESSION: Lobar/segmental pulmonary embolism within a branch of the right lower lobe pulmonary artery. Overall clot burden is small. No evidence of right heart strain. Mild interstitial edema. Moderate to large bilateral pleural effusions. Dependent atelectasis in the bilateral lower lobes. Abnormal soft  tissue throughout the right breast, extending to the right axilla, and involving the right pectoralis musculature. This appearance is concerning for infiltrating tumor which has progressed from the prior. Overlying skin thickening. These results were called by telephone at the time of interpretation on 03/22/2019 at 8:48 pm to Dr. Gareth Morgan, who verbally acknowledged these results. Aortic Atherosclerosis (ICD10-I70.0). Electronically Signed   By: Julian Hy M.D.   On: 03/22/2019 20:49   Dg Swallowing Func-speech Pathology  Result Date: 03/23/2019 Objective Swallowing Evaluation: Type of Study: MBS-Modified Barium Swallow Study  Patient Details Name: Peggy Welch MRN: [id number] Date of Birth: May 21, 1941 Today's Date: 03/23/2019 Time: SLP Start Time (ACUTE ONLY): 0810 -SLP Stop Time (ACUTE ONLY): 0840 SLP Time Calculation (min) (ACUTE ONLY): 30 min Past Medical History: Past Medical History: Diagnosis Date  Arthritis   Asthma   Asthma exacerbation 10/14/2011  Blood transfusion without reported diagnosis   Hepatitis C   from blood transfusion  Hypertension   Pneumonia 4/02016  rt breast ca dx'd 2019 Past Surgical History: Past Surgical History: Procedure Laterality Date  CHOLECYSTECTOMY  02/21/2002  JOINT REPLACEMENT    Bilateral total knee replacements  knee replacements Bilateral   Right Rotator Cuff Repair  08/12/2006  TOTAL HIP ARTHROPLASTY  Right 11/22/2014  dr Lorin Mercy  TOTAL HIP ARTHROPLASTY Right 11/22/2014  Procedure: TOTAL HIP ARTHROPLASTY ANTERIOR APPROACH;  Surgeon: Marybelle Killings, MD;  Location: [place];  Service: Orthopedics;  Laterality: Right; HPI: pt is a 78 yo female with h/o breast cancer admitted to [facility] with dysphagia.  Pt found to have hypoxia also.  MBS ordered.  Pt reports dysphagia clinically with symptoms including coughing on water immediately after swallowing.  Pt CT chest showed "Abnormal soft tissue throughout the right breast and extending to the right axilla with involvement of the right pectoralis musculature (series 7/image 27), measuring approximately 4.5 x 14.5 cm in axial dimension. While some of this appearance may reflect radiation change, the overall appearance is considered concerning for infiltrating tumor which has progressed from prior".  Pt is also positive for pulmonary embolism.  She also has bilateral pleural effusions.  Per MD notes, pt has not followed oncology for one year and does not want further treatment.  Subjective: pt awake in chair, very HOH Assessment / Plan / Recommendation CHL IP CLINICAL IMPRESSIONS 03/23/2019 Clinical Impression Patient presents with mild pharyngeal dysphagia with motor impairments but no aspiration.  Pt's dysphagia marked by decreased timing of laryngeal closure which allows consistent laryngeal penetration of thin liquids and pharyngeal retention clearance.  She is however protective of her airway and independently conducts dry swallows with breath hold to clear larynx/pharynx of liquid residual.  Indequate epiglottic deflection negatively impacts solids/puree pharyngeal clearance although pt is sensate.  Reflexive dry swallow decreased solid/puree residuals and liquid "wash" helpful to clear pharynx.  Postures attempted (head turn right and chin tuck) although neck ROM very limited thus not adequately performed nor were they helpful to prevent residuals or decrease amount of laryngeal  penetration.  Pt with increased aspiration risk with liquid using chin tuck as it contributes to spillage of residuals from pyriform sinus into larynx.  Pt is compensating well for her dysphagia without airway infiltration.  Recommend pt maximize her liquid nutritional intake as liquids clear pharynx easier than solids.  Pt reports she has been drinking liquid supplements at home.  Recommend dys3/thin diet with strict aspiration precautions.  Given pt reports xerostomia, recommend start all po with liquids.  Question if  pt's tumor progression may be impacting her pharyngeal and esophageal swallowing ability.  Will follow up for pt education to compensation strategies.  Note per review of chart, she did not want further treatment of her breast cancer thus compensation for dysphagia is likely optimal route for her goals.Of note, pt did cough x1 during MBS but there was not barium visualized in her trachea. SLP Visit Diagnosis Dysphagia, pharyngoesophageal phase (R13.14) Attention and concentration deficit following -- Frontal lobe and executive function deficit following -- Impact on safety and function Mild aspiration risk   CHL IP TREATMENT RECOMMENDATION 12/04/2014 Treatment Recommendations No treatment recommended at this time   No flowsheet data found. CHL IP DIET RECOMMENDATION 03/23/2019 SLP Diet Recommendations Dysphagia 3 (Mech soft) solids;Thin liquid Liquid Administration via Cup;Straw Medication Administration Crushed with puree Compensations Slow rate;Small sips/bites;Multiple dry swallows after each bite/sip;Follow solids with liquid Postural Changes Remain semi-upright after after feeds/meals (Comment);Seated upright at 90 degrees   CHL IP OTHER RECOMMENDATIONS 03/23/2019 Recommended Consults -- Oral Care Recommendations Oral care BID Other Recommendations --   CHL IP FOLLOW UP RECOMMENDATIONS 12/04/2014 Follow up Recommendations None   CHL IP FREQUENCY AND DURATION 03/23/2019 Speech Therapy Frequency (ACUTE  ONLY) min 1 x/week Treatment Duration 1 week      CHL IP ORAL PHASE 03/23/2019 Oral Phase Impaired Oral - Pudding Teaspoon -- Oral - Pudding Cup -- Oral - Honey Teaspoon -- Oral - Honey Cup -- Oral - Nectar Teaspoon -- Oral - Nectar Cup WFL;Piecemeal swallowing Oral - Nectar Straw -- Oral - Thin Teaspoon WFL;Piecemeal swallowing Oral - Thin Cup [facility];Piecemeal swallowing Oral - Thin Straw Piecemeal swallowing;WFL Oral - Puree Lingual pumping Oral - Mech Soft WFL Oral - Regular -- Oral - Multi-Consistency -- Oral - Pill -- Oral Phase - Comment --  CHL IP PHARYNGEAL PHASE 03/23/2019 Pharyngeal Phase -- Pharyngeal- Pudding Teaspoon -- Pharyngeal -- Pharyngeal- Pudding Cup -- Pharyngeal -- Pharyngeal- Honey Teaspoon -- Pharyngeal -- Pharyngeal- Honey Cup -- Pharyngeal -- Pharyngeal- Nectar Teaspoon -- Pharyngeal -- Pharyngeal- Nectar Cup -- Pharyngeal -- Pharyngeal- Nectar Straw -- Pharyngeal -- Pharyngeal- Thin Teaspoon WFL Pharyngeal Material does not enter airway Pharyngeal- Thin Cup Reduced airway/laryngeal closure;Penetration/Aspiration during swallow;Penetration/Aspiration before swallow;Pharyngeal residue - pyriform;Pharyngeal residue - valleculae Pharyngeal Material enters airway, remains ABOVE vocal cords then ejected out Pharyngeal- Thin Straw Reduced airway/laryngeal closure;Pharyngeal residue - valleculae;Pharyngeal residue - pyriform Pharyngeal Material enters airway, remains ABOVE vocal cords then ejected out Pharyngeal- Puree Reduced epiglottic inversion;Reduced tongue base retraction;Pharyngeal residue - valleculae;Pharyngeal residue - pyriform Pharyngeal Material does not enter airway Pharyngeal- Mechanical Soft Reduced tongue base retraction;Reduced epiglottic inversion;Pharyngeal residue - valleculae;Pharyngeal residue - pyriform Pharyngeal Material does not enter airway Pharyngeal- Regular -- Pharyngeal -- Pharyngeal- Multi-consistency -- Pharyngeal -- Pharyngeal- Pill -- Pharyngeal -- Pharyngeal Comment  pt with minimal neck ROM and thus chin tuck nor head turn right were nof performed adequately and they were not helpful in preventing laryngeal penetration or decreasing pharyngeal stasis; reflexive dry swallows with breath hold with liquids protective of her airway and liquid swallow post-swallow of food decreased pharyngeal residuals  CHL IP CERVICAL ESOPHAGEAL PHASE 03/23/2019 Cervical Esophageal Phase Impaired Pudding Teaspoon -- Pudding Cup -- Honey Teaspoon -- Honey Cup -- Nectar Teaspoon -- Nectar Cup -- Nectar Straw -- Thin Teaspoon -- Thin Cup -- Thin Straw -- Puree -- Mechanical Soft -- Regular -- Multi-consistency -- Pill -- Cervical Esophageal Comment With puree, pt appeared with halting below CP but this did clear with liquids.  Upon  esophageal sweep, pt appeared with minimal esophagus - suspect WFL.  Radiologist not present to confirm findings. Luanna Salk, MS [facility] SLP Acute Rehab Services Pager [phone] Office [phone] Macario Golds 03/23/2019, 11:08 AM               Procedures .Critical Care Performed by: Gareth Morgan, MD Authorized by: Gareth Morgan, MD   Critical care provider statement:    Critical care time (minutes):  45   Critical care was time spent personally by me on the following activities:  Evaluation of patient's response to treatment, examination of patient, ordering and performing treatments and interventions, ordering and review of laboratory studies, ordering and review of radiographic studies, pulse oximetry, re-evaluation of patient's condition, obtaining history from patient or surrogate and review of old charts   (including critical care time)  Medications Ordered in ED Medications  heparin ADULT infusion 100 units/mL (25000 units/238mL sodium chloride 0.45%) (1,000 Units/hr Intravenous New Bag/Given 03/23/19 0647)  sodium chloride flush (NS) 0.9 % injection 3 mL (3 mLs Intravenous Given 03/23/19 1004)  acetaminophen (TYLENOL) tablet 650 mg  (has no administration in time range)    Or  acetaminophen (TYLENOL) suppository 650 mg (has no administration in time range)  ondansetron (ZOFRAN) tablet 4 mg (has no administration in time range)    Or  ondansetron (ZOFRAN) injection 4 mg (has no administration in time range)  senna-docusate (Senokot-S) tablet 1 tablet (has no administration in time range)  0.9 % NaCl with KCl 20 mEq/ L  infusion ( Intravenous Stopped 03/23/19 1207)  albuterol (PROVENTIL) (2.5 MG/3ML) 0.083% nebulizer solution 2.5 mg (has no administration in time range)  hydrALAZINE (APRESOLINE) injection 5 mg (has no administration in time range)  diphenhydrAMINE (BENADRYL) capsule 25 mg (25 mg Oral Given 03/23/19 0136)  sodium chloride 0.9 % bolus 1,000 mL (0 mLs Intravenous Stopped 03/22/19 2104)  potassium chloride 10 mEq in 100 mL IVPB (0 mEq Intravenous Stopped 03/23/19 0131)  potassium chloride SA (K-DUR) CR tablet 40 mEq (40 mEq Oral Given 03/22/19 1941)  sodium chloride (PF) 0.9 % injection (  Given 03/23/19 0013)  iohexol (OMNIPAQUE) 350 MG/ML injection 75 mL (100 mLs Intravenous Contrast Given 03/22/19 2021)  heparin bolus via infusion 3,000 Units (3,000 Units Intravenous Bolus from Bag 03/22/19 2245)  phosphorus (K PHOS NEUTRAL) tablet 500 mg (500 mg Oral Given 03/23/19 0208)     Initial Impression / Assessment and Plan / ED Course  I have reviewed the triage vital signs and the nursing notes.  Pertinent labs & imaging results that were available during my care of the patient were reviewed by me and considered in my medical decision making (see chart for details).        78yo female with history above including history of metastatic breast cancer presents with shortness of breath and cough. XR shows bilateral pleural effusions, unclear if underlying pneumonia.  CT PE study shows PEs without right heart strain, worsening of right breast cancer, effusions.   Labs significant for hypokalemia, hypercalcermia.   Given K, fluid, heparin gtt. Will admit for further care.   Final Clinical Impressions(s) / ED Diagnoses   Final diagnoses:  Pleural effusion  Multiple subsegmental pulmonary emboli without acute cor pulmonale  Hypokalemia  Hypercalcemia    ED Discharge Orders    None       Gareth Morgan, MD 03/23/19 1313

## 2019-03-22 NOTE — ED Notes (Signed)
ED TO INPATIENT HANDOFF REPORT  ED Nurse Name and Phone #: [place] G, [phone]  S Name/Age/Gender Peggy Welch 78 y.o. female Room/Bed: WA10/WA10  Code Status   Code Status: Prior  Home/SNF/Other Home Patient oriented to: self, place, time and situation Is this baseline? Yes   Triage Complete: Triage complete  Chief Complaint low o2  Triage Note Patient sent over from PCP for difficulty swallowing, productive coughing, and low 02.   95% RA in triage.  Denies chest pain or shob   Hx. Asthma    A/Ox4 Ambulatory from wheelchair to chair.     Allergies Allergies  Allergen Reactions  . 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 Arthritis   . Asthma   . Asthma exacerbation 10/14/2011  . Blood transfusion without reported diagnosis   . Hepatitis C    from blood transfusion  . Hypertension   . Pneumonia 4/02016  . rt breast ca dx'd 2019   Past Surgical History:  Procedure Laterality Date  . CHOLECYSTECTOMY  02/21/2002  . JOINT REPLACEMENT     Bilateral total knee replacements  . knee replacements Bilateral   . Right Rotator Cuff Repair   08/12/2006  . TOTAL HIP ARTHROPLASTY Right 11/22/2014   dr Lorin Mercy  . TOTAL HIP ARTHROPLASTY Right 11/22/2014   Procedure: TOTAL HIP ARTHROPLASTY ANTERIOR APPROACH;  Surgeon: Marybelle Killings, MD;  Location: [facility];  Service: Orthopedics;  Laterality: Right;     A IV Location/Drains/Wounds Patient Lines/Drains/Airways Status   Active Line/Drains/Airways    Name:   Placement date:   Placement time:   Site:   Days:   Peripheral IV 03/22/19 Left Forearm   03/22/19    1833    Forearm   less than 1   Incision (Closed) 11/22/14 Hip Right   11/22/14    1438     1581          Intake/Output Last 24 hours  Intake/Output Summary (Last 24 hours) at 03/22/2019 2201 Last data filed at 03/22/2019 2104 Gross per 24 hour  Intake 1000 ml  Output -  Net 1000 ml    Labs/Imaging Results for orders placed or performed during the hospital encounter of 03/22/19 (from the past 48 hour(s))  CBC with Differential     Status: None   Collection Time: 03/22/19  6:31 PM  Result Value Ref Range   WBC 5.8 4.0 - 10.5 K/uL   RBC 4.47 3.87 - 5.11 MIL/uL   Hemoglobin 13.5 12.0 - 15.0 g/dL   HCT 42.3 36.0 - 46.0 %   MCV 94.6 80.0 - 100.0 fL   MCH 30.2 26.0 - 34.0 pg   MCHC 31.9 30.0 - 36.0 g/dL  RDW 14.0 11.5 - 15.5 %   Platelets 357 150 - 400 K/uL   nRBC 0.0 0.0 - 0.2 %   Neutrophils Relative % 57 %   Neutro Abs 3.3 1.7 - 7.7 K/uL   Lymphocytes Relative 29 %   Lymphs Abs 1.6 0.7 - 4.0 K/uL   Monocytes Relative 11 %   Monocytes Absolute 0.6 0.1 - 1.0 K/uL   Eosinophils Relative 2 %   Eosinophils Absolute 0.1 0.0 - 0.5 K/uL   Basophils Relative 1 %   Basophils Absolute 0.0 0.0 - 0.1 K/uL   Immature Granulocytes 0 %   Abs Immature Granulocytes 0.02 0.00 - 0.07 K/uL    Comment: Performed at [facility], [place] [address] [place], [place] [address]  Comprehensive metabolic panel     Status: Abnormal   Collection Time: 03/22/19  6:31 PM  Result Value Ref Range   Sodium 134 (L) 135 - 145  mmol/L   Potassium 2.5 (LL) 3.5 - 5.1 mmol/L    Comment: CRITICAL RESULT CALLED TO, READ BACK BY AND VERIFIED WITH: C.FRANKLIN AT 1920 ON 03/22/19 BY N.THOMPSON    Chloride 89 (L) 98 - 111 mmol/L   CO2 33 (H) 22 - 32 mmol/L   Glucose, Bld 101 (H) 70 - 99 mg/dL   BUN 5 (L) 8 - 23 mg/dL   Creatinine, Ser 0.64 0.44 - 1.00 mg/dL   Calcium 13.6 (HH) 8.9 - 10.3 mg/dL    Comment: CRITICAL RESULT CALLED TO, READ BACK BY AND VERIFIED WITH: C.FRANKLIN AT 1920 ON 03/22/19 BY N.THOMPSON    Total Protein 7.9 6.5 - 8.1 g/dL   Albumin 4.1 3.5 - 5.0 g/dL   AST 59 (H) 15 - 41 U/L   ALT 30 0 - 44 U/L   Alkaline Phosphatase 71 38 - 126 U/L   Total Bilirubin 1.3 (H) 0.3 - 1.2 mg/dL   GFR calc non Af Amer >60 >60 mL/min   GFR calc Af Amer >60 >60 mL/min   Anion gap 12 5 - 15    Comment: Performed at [facility], [place] [address], [place], [place] [address]  Magnesium     Status: None   Collection Time: 03/22/19  6:31 PM  Result Value Ref Range   Magnesium 2.0 1.7 - 2.4 mg/dL    Comment: Performed at [facility], [place] [address] [place], [place] [address]  Phosphorus     Status: Abnormal   Collection Time: 03/22/19  6:31 PM  Result Value Ref Range   Phosphorus 2.0 (L) 2.5 - 4.6 mg/dL    Comment: Performed at [facility], [place] [address], [place],  [address]   Dg Chest 2 View  Result Date: 03/22/2019 CLINICAL DATA:  78 y.o female sent over from PCP for difficulty swallowing, productive coughing, and low 02. 95% RA in triage. Denies chest pain or shob. Hx. Asthma, HTN, PNA, and rt breast cancer EXAM: CHEST - 2 VIEW COMPARISON:  Chest CT, 12/14/2017. Chest radiographs, 11/30/2014 and earlier. FINDINGS: Moderate bilateral pleural effusions obscure the hemidiaphragms and most of the heart borders. There is additional lung base opacity consistent with atelectasis. Remainder of the lungs is clear. No pneumothorax. Cardiac silhouette is top-normal  in size. No mediastinal or hilar masses. Skeletal structures are intact. IMPRESSION: 1. Moderate bilateral pleural effusions with associated basilar atelectasis. Cannot exclude underlying pneumonia. No evidence of pulmonary edema. Electronically Signed   By: Lajean Manes M.D.   On:  03/22/2019 17:42   Ct Angio Chest Pe W And/or Wo Contrast  Result Date: 03/22/2019 CLINICAL DATA:  Difficulty swallowing, productive cough, hypoxia. History of breast cancer. EXAM: CT ANGIOGRAPHY CHEST WITH CONTRAST TECHNIQUE: Multidetector CT imaging of the chest was performed using the standard protocol during bolus administration of intravenous contrast. Multiplanar CT image reconstructions and MIPs were obtained to evaluate the vascular anatomy. CONTRAST:  160mL OMNIPAQUE IOHEXOL 350 MG/ML SOLN COMPARISON:  Chest radiographs dated 03/22/2019. CT chest dated 12/14/2017. FINDINGS: Cardiovascular: Satisfactory opacification of the bilateral pulmonary arteries to the lobar level. Lobar/segmental pulmonary embolism within a branch of the right lower lobe pulmonary artery (series 8/image 117). Overall clot burden is small. No evidence of right heart strain. No evidence of thoracic aortic aneurysm or dissection. Mild atherosclerotic calcifications of the aortic arch. The heart is normal in size.  Trace pericardial fluid. Mediastinum/Nodes: No suspicious mediastinal lymphadenopathy. Visualized thyroid is unremarkable. Lungs/Pleura: Evaluation of the lung parenchyma is constrained by respiratory motion. Within that constraint, there are no suspicious pulmonary nodules. Suspected mild perihilar edema in the bilateral upper lobes. Moderate to large bilateral pleural effusions. Associated areas of scattered atelectasis in the bilateral upper lobes with dependent atelectasis in the bilateral lower lobes. No pneumothorax. Upper Abdomen: Visualized upper abdomen is notable for prior cholecystectomy. Musculoskeletal: Abnormal soft tissue  throughout the right breast and extending to the right axilla with involvement of the right pectoralis musculature (series 7/image 27), measuring approximately 4.5 x 14.5 cm in axial dimension. While some of this appearance may reflect radiation change, the overall appearance is considered concerning for infiltrating tumor which has progressed from the prior. Overlying skin thickening. Degenerative changes of the visualized thoracolumbar spine. Review of the MIP images confirms the above findings. IMPRESSION: Lobar/segmental pulmonary embolism within a branch of the right lower lobe pulmonary artery. Overall clot burden is small. No evidence of right heart strain. Mild interstitial edema. Moderate to large bilateral pleural effusions. Dependent atelectasis in the bilateral lower lobes. Abnormal soft tissue throughout the right breast, extending to the right axilla, and involving the right pectoralis musculature. This appearance is concerning for infiltrating tumor which has progressed from the prior. Overlying skin thickening. These results were called by telephone at the time of interpretation on 03/22/2019 at 8:48 pm to Dr. Gareth Morgan, who verbally acknowledged these results. Aortic Atherosclerosis (ICD10-I70.0). Electronically Signed   By: Julian Hy M.D.   On: 03/22/2019 20:49    Pending Labs Unresulted Labs (From admission, onward)    Start     Ordered   03/22/19 2114  Protime-INR  Once-Timed,   STAT     03/22/19 2113   03/22/19 2114  APTT  Once,   STAT     03/22/19 2113   03/22/19 1757  SARS Coronavirus 2 (CEPHEID - Performed in [facility] hospital lab), Hosp Order  (Asymptomatic Patients Labs)  Once,   STAT    Question:  Rule Out  Answer:  Yes   03/22/19 1757          Vitals/Pain Today's Vitals   03/22/19 1754 03/22/19 1830 03/22/19 2130 03/22/19 2141  BP: (!) 97/54 134/75 (!) 158/85   Pulse: 94 91 90   Resp: 16 (!) 21 19   Temp:      TempSrc:      SpO2: 94% 92% 95%    Weight:    68.5 kg  Height:    5' 4.5" (1.638 m)  PainSc:        Isolation  Precautions No active isolations  Medications Medications  potassium chloride 10 mEq in 100 mL IVPB (10 mEq Intravenous New Bag/Given 03/22/19 2104)  sodium chloride (PF) 0.9 % injection (has no administration in time range)  sodium chloride 0.9 % bolus 1,000 mL (0 mLs Intravenous Stopped 03/22/19 2104)  potassium chloride SA (K-DUR) CR tablet 40 mEq (40 mEq Oral Given 03/22/19 1941)  iohexol (OMNIPAQUE) 350 MG/ML injection 75 mL (100 mLs Intravenous Contrast Given 03/22/19 2021)    Mobility walks Moderate fall risk

## 2019-03-22 NOTE — ED Notes (Signed)
Report given to Tanya, RN.

## 2019-03-22 NOTE — ED Notes (Signed)
CRITICAL VALUE STICKER  CRITICAL VALUE: Potassium: 2.5    Calcium: 13.6  DATE & TIME NOTIFIED: 03/22/19 1919  MD NOTIFIED: Billy Fischer MD  TIME OF NOTIFICATION: [phone]

## 2019-03-22 NOTE — Progress Notes (Signed)
ANTICOAGULATION CONSULT NOTE - Follow Up Consult  Pharmacy Consult for Heparin Indication: pulmonary embolus  Allergies  Allergen Reactions  . 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    Swelling in throat    Patient Measurements: Height: 5' 4.5" (163.8 cm) Weight: 151 lb (68.5 kg) IBW/kg (Calculated) : 55.85 Heparin Dosing Weight:   Vital Signs: Temp: 99.2 F (37.3 C) (07/28 1622) Temp Source: Oral (07/28 1622) BP: 158/85 (07/28 2130) Pulse Rate: 90 (07/28 2130)  Labs: Recent Labs    03/22/19 1831  HGB 13.5  HCT 42.3  PLT 357  CREATININE 0.64    Estimated Creatinine Clearance: 54.8 mL/min (by C-G formula based on SCr of 0.64 mg/dL).   Medications:  Infusions:  . heparin    . potassium chloride 10 mEq (03/22/19 2104)    Assessment: Patient with new PE.  No oral anticoagulants noted on med rec. Baseline labs ordered.  Goal of Therapy:  Heparin level 0.3-0.7 units/ml Monitor platelets by anticoagulation protocol: Yes   Plan:  Heparin bolus 3000 units iv x1 Heparin drip at 1000 units/hr Daily CBC Next heparin level at 0800    Tyler Deis, Shea Stakes [place] 03/22/2019,10:15 PM

## 2019-03-22 NOTE — H&P (Signed)
History and Physical    Peggy Welch SEL:[id number] DOB: 06-08-1941 DOA: 03/22/2019  PCP: Lucianne Lei, MD  Patient coming from: Home  I have personally briefly reviewed patient's old medical records in [place]  Chief Complaint: Difficulty swallowing, shortness of breath, cough  HPI: Peggy Welch is a 78 y.o. 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  She has also had some shortness of breath.  Patient has a known history of right-sided breast cancer with involvement of the lung and bones.  Previously seen by oncology Dr. Burr Medico, but has not followed up in over a year.  Per daughter, patient expressed wishes to avoid further treatment regarding her cancer.  Patient otherwise denies any recent subjective fevers, diaphoresis, chest pain, abdominal pain, diarrhea, constipation, or dysuria.  ED Course:  Initial vitals showed BP 135/117, pulse 95, RR 16, temp 99.2 Fahrenheit, SPO2 97% on room air.  Labs are notable for potassium 2.5, magnesium 2.0, sodium 134, chloride 89, bicarb 33, BUN 5, creatinine 0.64, calcium 13.6, albumin 4.1, phosphorus 2.0.  WBC 5.8, hemoglobin 13.5, platelets 357,000.  SARS-CoV-2 test was obtained and pending.  Portable chest x-ray showed moderate bilateral pleural effusions with basilar atelectasis.  CTA chest PE study showed a lobar/segmental PE within a branch of the right lower lobe pulmonary artery with overall small clot burden or evidence of right heart strain.  Mild interstitial edema and moderate to large bilateral pleural effusions are noted with dependent atelectasis in bilateral lower lobes.  Abnormal soft tissue throughout the right breast extending to the right axilla and involving the right pectoralis musculature was noted with appearance  concerning for progression of infiltrating tumor when compared to prior.  Patient was given 1 L normal saline, 40 mEq of oral potassium, IV K 10 mEq x 4 runs, and started on IV heparin drip.  The hospitalist service was consulted to admit for further evaluation and management.   Review of Systems: All systems reviewed and are negative except as documented in history of present illness above.   Past Medical History:  Diagnosis Date   Arthritis    Asthma    Asthma exacerbation 10/14/2011   Blood transfusion without reported diagnosis    Hepatitis C    from blood transfusion   Hypertension    Pneumonia 4/02016   rt breast ca dx'd 2019    Past Surgical History:  Procedure Laterality Date   CHOLECYSTECTOMY  02/21/2002   JOINT REPLACEMENT     Bilateral total knee replacements   knee replacements Bilateral    Right Rotator Cuff Repair  08/12/2006   TOTAL HIP ARTHROPLASTY Right 11/22/2014   dr Lorin [facility]   TOTAL HIP ARTHROPLASTY Right 11/22/2014   Procedure: TOTAL HIP ARTHROPLASTY ANTERIOR APPROACH;  Surgeon: Marybelle Killings, MD;  Location: [facility];  Service: Orthopedics;  Laterality: Right;    Social History:  reports that she has never smoked. She has never used smokeless tobacco. She reports that she does not drink alcohol or use drugs.  Allergies  Allergen Reactions   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    Swelling in throat    Family History  Problem Relation Age of Onset   Hypertension Sister      Prior to Admission medications   Medication Sig Start Date End Date Taking? Authorizing Provider  acetaminophen (  TYLENOL) 650 MG CR tablet Take 650 mg by mouth every 8 (eight) hours as needed for pain (pain).    [provider]  albuterol (PROVENTIL) (2.5 MG/3ML) 0.083% nebulizer solution Take 2.5 mg by nebulization every 4 (four) hours as needed for wheezing or shortness of breath (wheezing).    [provider]  alendronate (FOSAMAX) 70 MG tablet  Take 70 mg by mouth every 7 (seven) days. On Tuesdays. Take with a full glass of water on an empty stomach.    [provider]  anastrozole (ARIMIDEX) 1 MG tablet TAKE 1 TABLET(1 MG) BY MOUTH DAILY 08/23/18   Truitt Merle, MD  aspirin 81 MG tablet Take 81 mg by mouth daily.    [provider]  Aspirin-Calcium Carbonate 81-777 MG TABS Take by mouth.    [provider]  B Complex-C (B-COMPLEX WITH VITAMIN C) tablet Take 1 tablet by mouth daily.    [provider]  hydrochlorothiazide (HYDRODIURIL) 25 MG tablet Take 25 mg by mouth daily.    [provider]  naproxen (NAPROSYN) 250 MG tablet Take 1 tablet (250 mg total) by mouth 2 (two) times daily. 07/09/16   [name], MD  Potassium Chloride ER 20 MEQ TBCR TK 1 T PO D 01/21/17   [provider]  potassium chloride SA (K-DUR,KLOR-CON) 20 MEQ tablet Take 20 mEq by mouth daily. 10/25/14   [provider]    Physical Exam: Vitals:   03/22/19 1754 03/22/19 1830 03/22/19 2130 03/22/19 2141  BP: (!) 97/54 134/75 (!) 158/85   Pulse: 94 91 90   Resp: 16 (!) 21 19   Temp:      TempSrc:      SpO2: 94% 92% 95%   Weight:    68.5 kg  Height:    5' 4.5" (1.638 m)    Constitutional: Elderly woman resting supine in bed, NAD, calm, comfortable Eyes: PERRL, lids and conjunctivae normal ENMT: Mucous membranes are moist. Posterior pharynx clear of any exudate or lesions.poor dentition.  Neck: normal, supple, no masses. Respiratory: Diminished breath sounds bilateral lung bases otherwise no wheezing or crackles.. Normal respiratory effort. No accessory muscle use.  Cardiovascular: Regular rate and rhythm, systolic murmur present. No extremity edema. 2+ pedal pulses. Abdomen: no tenderness, no masses palpated. No hepatosplenomegaly. Bowel sounds positive.  Breast: Large tumor right breast noted, hard to palpation without tenderness Musculoskeletal: no clubbing / cyanosis. No joint deformity upper and  lower extremities. Good ROM, no contractures. Normal muscle tone.  Skin: no rashes, lesions, ulcers. No induration Neurologic: CN 2-12 grossly intact. Sensation intact, Strength 5/5 in all 4.  Psychiatric:  Alert and oriented x 3. Normal mood.  Seems to have poor insight into her medical condition.    Labs on Admission: I have personally reviewed following labs and imaging studies  CBC: Recent Labs  Lab 03/22/19 1831  WBC 5.8  NEUTROABS 3.3  HGB 13.5  HCT 42.3  MCV 94.6  PLT 381   Basic Metabolic Panel: Recent Labs  Lab 03/22/19 1831  NA 134*  K 2.5*  CL 89*  CO2 33*  GLUCOSE 101*  BUN 5*  CREATININE 0.64  CALCIUM 13.6*  MG 2.0  PHOS 2.0*   GFR: Estimated Creatinine Clearance: 54.8 mL/min (by C-G formula based on SCr of 0.64 mg/dL). Liver Function Tests: Recent Labs  Lab 03/22/19 1831  AST 59*  ALT 30  ALKPHOS 71  BILITOT 1.3*  PROT 7.9  ALBUMIN 4.1   No results  for input(s): LIPASE, AMYLASE in the last 168 hours. No results for input(s): AMMONIA in the last 168 hours. Coagulation Profile: No results for input(s): INR, PROTIME in the last 168 hours. Cardiac Enzymes: No results for input(s): CKTOTAL, CKMB, CKMBINDEX, TROPONINI in the last 168 hours. BNP (last 3 results) No results for input(s): PROBNP in the last 8760 hours. HbA1C: No results for input(s): HGBA1C in the last 72 hours. CBG: No results for input(s): GLUCAP in the last 168 hours. Lipid Profile: No results for input(s): CHOL, HDL, LDLCALC, TRIG, CHOLHDL, LDLDIRECT in the last 72 hours. Thyroid Function Tests: No results for input(s): TSH, T4TOTAL, FREET4, T3FREE, THYROIDAB in the last 72 hours. Anemia Panel: No results for input(s): VITAMINB12, FOLATE, FERRITIN, TIBC, IRON, RETICCTPCT in the last 72 hours. Urine analysis:    Component Value Date/Time   COLORURINE YELLOW 12/01/2014 0914   APPEARANCEUR CLEAR 12/01/2014 0914   LABSPEC 1.010 12/01/2014 0914   PHURINE 5.5 12/01/2014 0914     GLUCOSEU NEGATIVE 12/01/2014 0914   HGBUR NEGATIVE 12/01/2014 0914   BILIRUBINUR NEGATIVE 12/01/2014 0914   KETONESUR NEGATIVE 12/01/2014 0914   PROTEINUR NEGATIVE 12/01/2014 0914   UROBILINOGEN 1.0 12/01/2014 0914   NITRITE NEGATIVE 12/01/2014 0914   LEUKOCYTESUR SMALL (A) 12/01/2014 0914    Radiological Exams on Admission: Dg Chest 2 View  Result Date: 03/22/2019 CLINICAL DATA:  79 y.o female sent over from PCP for difficulty swallowing, productive coughing, and low 02. 95% RA in triage. Denies chest pain or shob. Hx. Asthma, HTN, PNA, and rt breast cancer EXAM: CHEST - 2 VIEW COMPARISON:  Chest CT, 12/14/2017. Chest radiographs, 11/30/2014 and earlier. FINDINGS: Moderate bilateral pleural effusions obscure the hemidiaphragms and most of the heart borders. There is additional lung base opacity consistent with atelectasis. Remainder of the lungs is clear. No pneumothorax. Cardiac silhouette is top-normal in size. No mediastinal or hilar masses. Skeletal structures are intact. IMPRESSION: 1. Moderate bilateral pleural effusions with associated basilar atelectasis. Cannot exclude underlying pneumonia. No evidence of pulmonary edema. Electronically Signed   By: Lajean Manes M.D.   On: 03/22/2019 17:42   Ct Angio Chest Pe W And/or Wo Contrast  Result Date: 03/22/2019 CLINICAL DATA:  Difficulty swallowing, productive cough, hypoxia. History of breast cancer. EXAM: CT ANGIOGRAPHY CHEST WITH CONTRAST TECHNIQUE: Multidetector CT imaging of the chest was performed using the standard protocol during bolus administration of intravenous contrast. Multiplanar CT image reconstructions and MIPs were obtained to evaluate the vascular anatomy. CONTRAST:  136mL OMNIPAQUE IOHEXOL 350 MG/ML SOLN COMPARISON:  Chest radiographs dated 03/22/2019. CT chest dated 12/14/2017. FINDINGS: Cardiovascular: Satisfactory opacification of the bilateral pulmonary arteries to the lobar level. Lobar/segmental pulmonary embolism  within a branch of the right lower lobe pulmonary artery (series 8/image 117). Overall clot burden is small. No evidence of right heart strain. No evidence of thoracic aortic aneurysm or dissection. Mild atherosclerotic calcifications of the aortic arch. The heart is normal in size.  Trace pericardial fluid. Mediastinum/Nodes: No suspicious mediastinal lymphadenopathy. Visualized thyroid is unremarkable. Lungs/Pleura: Evaluation of the lung parenchyma is constrained by respiratory motion. Within that constraint, there are no suspicious pulmonary nodules. Suspected mild perihilar edema in the bilateral upper lobes. Moderate to large bilateral pleural effusions. Associated areas of scattered atelectasis in the bilateral upper lobes with dependent atelectasis in the bilateral lower lobes. No pneumothorax. Upper Abdomen: Visualized upper abdomen is notable for prior cholecystectomy. Musculoskeletal: Abnormal soft tissue throughout the right breast and extending to the right axilla with involvement of  the right pectoralis musculature (series 7/image 27), measuring approximately 4.5 x 14.5 cm in axial dimension. While some of this appearance may reflect radiation change, the overall appearance is considered concerning for infiltrating tumor which has progressed from the prior. Overlying skin thickening. Degenerative changes of the visualized thoracolumbar spine. Review of the MIP images confirms the above findings. IMPRESSION: Lobar/segmental pulmonary embolism within a branch of the right lower lobe pulmonary artery. Overall clot burden is small. No evidence of right heart strain. Mild interstitial edema. Moderate to large bilateral pleural effusions. Dependent atelectasis in the bilateral lower lobes. Abnormal soft tissue throughout the right breast, extending to the right axilla, and involving the right pectoralis musculature. This appearance is concerning for infiltrating tumor which has progressed from the prior.  Overlying skin thickening. These results were called by telephone at the time of interpretation on 03/22/2019 at 8:48 pm to Dr. Gareth Morgan, who verbally acknowledged these results. Aortic Atherosclerosis (ICD10-I70.0). Electronically Signed   By: Julian Hy M.D.   On: 03/22/2019 20:49    EKG: Independently reviewed. Sinus rhythm with IVCD, incomplete LBBB.  Assessment/Plan Principal Problem:   Hypercalcemia of malignancy Active Problems:   Hypokalemia   Breast cancer of lower-inner quadrant of right female breast ([place])   Pulmonary embolism ([place])   Dysphagia   Peggy Welch is a 78 y.o. 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   Hypercalcemia: Moderate hypercalcemia 13.6 on admission likely secondary to malignancy in addition to HCTZ use.  She is hard of hearing but does not appear to have altered sensorium.  Therefore will manage with IV fluids paying close attention to volume status in setting of bilateral pleural effusions.  Can consider further treatment with calcitonin and zoledronic acid as well as IV Lasix once adequately hydrated. -Continue maintenance fluids overnight, monitor strict I/O's -Hold home HCTZ  Hypokalemia/hypophosphatemia: K2.5, Foss 2.0 on admission.  Oral and IV repletion ordered.  Magnesium is 2.0.  Will recheck labs in a.m.  Lobar/segmental PE within branch of the right lower lobe pulmonary artery: Likely due to malignancy.  Has been started on IV heparin.  Clot burden does not appear to be significant therefore may may not need long-term anticoagulation.  Right breast cancer with metastases to lung and bones and RUE lymphedema: CT scan shows changes concerning for progression of infiltrating tumor.  Patient was previously followed by oncology, Dr. Burr Medico, but has not followed up in over a year.  Per discussion with  daughter, patient has expressed to her that she did not want continued therapy for cancer and that is why she stopped following with oncology. -Consider oncology consultation for further discussion regarding potential management  Bilateral pleural effusions: Suspect due to malignancy.  She is currently saturating well on room air does not appear to have respiratory distress.  She is on IV fluids as above for hypercalcemia, can consider addition of Lasix if needed.  Dysphagia: Reported recent difficulty with swallowing and coughing.  Possibly related to tumor progression.  Have ordered SLP/swallow evaluation.  Hypertension: Hypertensive on admission.  Holding home HCTZ as above.  Use IV hydralazine as needed.  Asthma: No active wheezing.  Do not think presenting symptoms are related to asthma exacerbation.  Can continue albuterol as needed.  DVT prophylaxis: Heparin gtt Code Status: DNR/DNI, this is confirmed per discussion with daughter Family Communication: Discussed with daughter Peggy Welch by phone [phone]  Disposition Plan: Pending clinical progress Consults called: None Admission status: Admit - It is my clinical opinion that admission to INPATIENT is reasonable and necessary because of the expectation that this patient will require hospital care that crosses at least 2 midnights to treat this condition based on the medical complexity of the problems presented.  Given the aforementioned information, the predictability of an adverse outcome is felt to be significant.    Zada Finders MD [facility]  If 7PM-7AM, please contact night-coverage [email or web address]  03/22/2019, 10:23 PM

## 2019-03-23 ENCOUNTER — Other Ambulatory Visit: Payer: Self-pay

## 2019-03-23 ENCOUNTER — Encounter (HOSPITAL_COMMUNITY): Payer: Self-pay

## 2019-03-23 ENCOUNTER — Inpatient Hospital Stay (HOSPITAL_COMMUNITY): Payer: Medicare Other

## 2019-03-23 DIAGNOSIS — I2699 Other pulmonary embolism without acute cor pulmonale: Secondary | ICD-10-CM

## 2019-03-23 DIAGNOSIS — E876 Hypokalemia: Secondary | ICD-10-CM

## 2019-03-23 DIAGNOSIS — C50911 Malignant neoplasm of unspecified site of right female breast: Secondary | ICD-10-CM

## 2019-03-23 DIAGNOSIS — I10 Essential (primary) hypertension: Secondary | ICD-10-CM

## 2019-03-23 DIAGNOSIS — J90 Pleural effusion, not elsewhere classified: Secondary | ICD-10-CM

## 2019-03-23 LAB — CBC
HCT: 38.4 % (ref 36.0–46.0)
Hemoglobin: 12.3 g/dL (ref 12.0–15.0)
MCH: 31 pg (ref 26.0–34.0)
MCHC: 32 g/dL (ref 30.0–36.0)
MCV: 96.7 fL (ref 80.0–100.0)
Platelets: 343 10*3/uL (ref 150–400)
RBC: 3.97 MIL/uL (ref 3.87–5.11)
RDW: 14.3 % (ref 11.5–15.5)
WBC: 6.2 10*3/uL (ref 4.0–10.5)
nRBC: 0 % (ref 0.0–0.2)

## 2019-03-23 LAB — RENAL FUNCTION PANEL
Albumin: 3.5 g/dL (ref 3.5–5.0)
Anion gap: 10 (ref 5–15)
BUN: 5 mg/dL — ABNORMAL LOW (ref 8–23)
CO2: 28 mmol/L (ref 22–32)
Calcium: 11.6 mg/dL — ABNORMAL HIGH (ref 8.9–10.3)
Chloride: 100 mmol/L (ref 98–111)
Creatinine, Ser: 0.55 mg/dL (ref 0.44–1.00)
GFR calc Af Amer: >60 mL/min (ref >60)
GFR calc non Af Amer: >60 mL/min (ref >60)
Glucose, Bld: 87 mg/dL (ref 70–99)
Phosphorus: 2.5 mg/dL (ref 2.5–4.6)
Potassium: 3 mmol/L — ABNORMAL LOW (ref 3.5–5.1)
Sodium: 138 mmol/L (ref 135–145)

## 2019-03-23 LAB — HEPARIN LEVEL (UNFRACTIONATED)
Heparin Unfractionated: 0.49 IU/mL (ref 0.30–0.70)
Heparin Unfractionated: 0.38 IU/mL (ref 0.30–0.70)

## 2019-03-23 MED ORDER — MIRTAZAPINE 15 MG PO TABS
7.5000 mg | ORAL_TABLET | Freq: Every day | ORAL | Status: DC
  Administered 2019-03-23 – 2019-03-25 (×3): 7.5 mg via ORAL
  Filled 2019-03-23 (×3): qty 1

## 2019-03-23 MED ORDER — POTASSIUM CHLORIDE IN NACL 20-0.9 MEQ/L-% IV SOLN
INTRAVENOUS | Status: DC
  Administered 2019-03-23: 18:00:00 via INTRAVENOUS
  Filled 2019-03-23 (×2): qty 1000

## 2019-03-23 MED ORDER — DIPHENHYDRAMINE HCL 25 MG PO CAPS
25.0000 mg | ORAL_CAPSULE | Freq: Every evening | ORAL | Status: DC | PRN
  Administered 2019-03-23 – 2019-03-25 (×3): 25 mg via ORAL
  Filled 2019-03-23 (×3): qty 1

## 2019-03-23 MED ORDER — FUROSEMIDE 10 MG/ML IJ SOLN
20.0000 mg | Freq: Once | INTRAMUSCULAR | Status: AC
  Administered 2019-03-23: 20 mg via INTRAVENOUS
  Filled 2019-03-23: qty 2

## 2019-03-23 MED ORDER — POTASSIUM CHLORIDE CRYS ER 20 MEQ PO TBCR
40.0000 meq | EXTENDED_RELEASE_TABLET | Freq: Once | ORAL | Status: AC
  Administered 2019-03-23: 21:00:00 40 meq via ORAL
  Filled 2019-03-23: qty 2

## 2019-03-23 MED ORDER — ZOLPIDEM TARTRATE 5 MG PO TABS: 5.0000 mg | ORAL_TABLET | Freq: Once | ORAL | Status: DC

## 2019-03-23 MED ORDER — ZOLEDRONIC ACID 4 MG/5ML IV CONC
4.0000 mg | Freq: Once | INTRAVENOUS | Status: AC
  Administered 2019-03-23: 4 mg via INTRAVENOUS
  Filled 2019-03-23: qty 5

## 2019-03-23 NOTE — Progress Notes (Addendum)
ANTICOAGULATION CONSULT NOTE - Follow Up Consult  Pharmacy Consult for Heparin Indication: pulmonary embolus  Allergies  Allergen Reactions  . 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    Swelling in throat    Patient Measurements: Height: 5\' 4"  (162.6 cm) Weight: 153 lb 14.1 oz (69.8 kg) IBW/kg (Calculated) : 54.7 Heparin Dosing Weight: 68.8 kg  Vital Signs: Temp: 98.6 F (37 C) (07/29 0621) Temp Source: Oral (07/29 0621) BP: 124/69 (07/29 0621) Pulse Rate: 92 (07/29 0621)  Labs: Recent Labs    03/22/19 1831 03/22/19 2114 03/23/19 0755  HGB 13.5  --  12.3  HCT 42.3  --  38.4  PLT 357  --  343  APTT  --  29  --   LABPROT  --  14.0  --   INR  --  1.1  --   HEPARINUNFRC  --   --  0.49  CREATININE 0.64  --  0.55    Estimated Creatinine Clearance: 54.6 mL/min (by C-G formula based on SCr of 0.55 mg/dL).   Assessment: 25 y/oF with new PE. Pharmacy consulted for IV heparin dosing. Patient not on any anticoagulants PTA. Baseline labs WNL.  Today, 03/23/19   Heparin level = 0.49 units/mL, therapeutic  CBC WNL/stable  No bleeding or infusion issues noted per nursing  Goal of Therapy:  Heparin level 0.3-0.7 units/ml Monitor platelets by anticoagulation protocol: Yes   Plan:  Continue heparin infusion at 1000 units/hr Heparin level at 1600 to ensure remains within therapeutic range Daily CBC, heparin level Monitor closely for s/sx of bleeding     Lindell Spar, PharmD, BCPS Clinical Pharmacist  03/23/2019,9:48 AM     Addendum: PM heparin level = 0.38 units/mL, remains therapeutic No bleeding or infusion issues noted per nursing   Plan: Continue heparin infusion at 1000 units/hr Daily CBC, heparin level   Lindell Spar, PharmD, BCPS Clinical Pharmacist  03/23/2019 5:26 PM

## 2019-03-23 NOTE — Progress Notes (Signed)
  Speech Language Pathology Treatment: Dysphagia  Patient Details Name: Peggy Welch MRN: [id number] DOB: 04/08/1941 Today's Date: 03/23/2019 Time: 1194-1740 SLP Time Calculation (min) (ACUTE ONLY): 16 min  Assessment / Plan / Recommendation Clinical Impression  SLP visit to educate pt to findings of MBS and clinical reasoning for swallow precautions.  Showed pt video, provided written compensation strategies as well as written information re: diet.  Using teach back, pt reiterated strategies.  Pt's meal tray arrived including broccolli and spaghetti - which pt stated she could not tolerated.  She consumes puree foods at home but SLP advised against only purees due to congealing factor when they cool.  SLP recommends modify to dys2/thin with strict precautions.  SLP will follow up for dysphagia management but recommend RD consult to help maximize liquid nutrition.    Pt questions source of dysphagia - defer to MD as she is sensory intact fortunately.      HPI HPI: pt is a 78 yo female with h/o breast cancer admitted to [facility] with dysphagia.  Pt found to have hypoxia also.  MBS ordered.  Pt reports dysphagia clinically with symptoms including coughing on water immediately after swallowing.  Pt CT chest showed "Abnormal soft tissue throughout the right breast and extending to the right axilla with involvement of the right pectoralis musculature (series 7/image 27), measuring approximately 4.5 x 14.5 cm in axial dimension. While some of this appearance may reflect radiation change, the overall appearance is considered concerning for infiltrating tumor which has progressed from prior".  Pt is also positive for pulmonary embolism.  She also has bilateral pleural effusions.  Per MD notes, pt has not followed oncology for one year and does not want further treatment.      SLP Plan  Continue with current plan of care       Recommendations  Diet recommendations: Dysphagia 2 (fine chop);Thin  liquid Liquids provided via: Cup;Straw Medication Administration: Crushed with puree Supervision: Patient able to self feed Compensations: Slow rate;Small sips/bites;Multiple dry swallows after each bite/sip;Follow solids with liquid Postural Changes and/or Swallow Maneuvers: Seated upright 90 degrees;Upright 30-60 min after meal                Oral Care Recommendations: Oral care QID Follow up Recommendations: (tbd) SLP Visit Diagnosis: Dysphagia, pharyngoesophageal phase (R13.14) Plan: Continue with current plan of care       GO                Peggy Welch 03/23/2019, 1:01 PM  Peggy Welch, [facility] SLP Acute Rehab Services Pager [phone] Office [phone]

## 2019-03-23 NOTE — Progress Notes (Signed)
Per MD on dayshift if RN on nightshift believes patient could be going into fluid overload it is OK for the RN to turn down fluids to [facility] until the next morning rounds to be reassessed. Will continue to monitor patient closely. thanks

## 2019-03-23 NOTE — Progress Notes (Signed)
Modified Barium Swallow Progress Note  Patient Details  Name: Peggy Welch MRN: [id number] Date of Birth: May 13, 1941  Today's Date: 03/23/2019  Modified Barium Swallow completed.  Full report located under Chart Review in the Imaging Section.  Brief recommendations include the following:  Clinical Impression   Patient presents with mild pharyngeal dysphagia with motor impairments but no aspiration.  Pt's dysphagia marked by decreased timing of laryngeal closure which allows consistent laryngeal penetration of thin liquids and pharyngeal retention clearance.  She is however protective of her airway and independently conducts dry swallows with breath hold to clear larynx/pharynx of liquid residual.  Indequate epiglottic deflection negatively impacts solids/puree pharyngeal clearance although pt is sensate.  Reflexive dry swallow decreased solid/puree residuals and liquid "wash" helpful to clear pharynx.  Postures attempted (head turn right and chin tuck) although neck ROM very limited thus not adequately performed nor were they helpful to prevent residuals or decrease amount of laryngeal penetration.  Pt with increased aspiration risk with liquid using chin tuck as it contributes to spillage of residuals from pyriform sinus into larynx.    Pt is compensating well for her dysphagia without airway infiltration.  Recommend pt maximize her liquid nutritional intake as liquids clear pharynx easier than solids.  Pt reports she has been drinking liquid supplements at home.  Recommend dys3/thin diet with strict aspiration precautions.  Given pt reports xerostomia, recommend start all po with liquids.  Question if pt's tumor progression may be impacting her pharyngeal and esophageal swallowing ability.  Will follow up for pt education to compensation strategies.    Note per review of chart, she did not want further treatment of her breast cancer thus compensation for dysphagia is likely optimal route for  her goals.Of note, pt did cough x1 during MBS but there was not barium visualized in her trachea.  Dysphagia, pharyngoesophageal phase (R13.14)  --     Swallow Evaluation Recommendations       SLP Diet Recommendations: Dysphagia 3 (Mech soft) solids;Thin liquid   Liquid Administration via: Cup;Straw   Medication Administration: Crushed with puree(crush if large and not contraindicated, consider suspension,etc)       Compensations: Slow rate;Small sips/bites;Multiple dry swallows after each bite/sip;Follow solids with liquid, Start all po with liquids   Postural Changes: Remain semi-upright after after feeds/meals (Comment);Seated upright at 90 degrees   Oral Care Recommendations: Oral care BID       Luanna Salk, MS [facility] SLP Acute Rehab Services Pager [phone] Office [phone]  Macario Golds 03/23/2019,11:09 AM

## 2019-03-23 NOTE — Progress Notes (Signed)
MBS completed, Full report to follow.  Pt presents with mild pharyngeal dysphagia resulting in pharyngeal residuals = No aspiration of any consistency tested.  Trace laryngeal penetration of thin noted during the swallow which cleared with further swallows.  Breath hold conducted with each swallow which protects airway.  Recommend pt maximize liquid intake as these are more efficient for her to swallow.  Medication crushed preferred if any significant size to aid clearance.  Various postures including chin tuck and head turn *though performed only minimally due to limited ROM* did not improve her pharyngeal clearance.  Following solids with liquids helpful also.      Luanna Salk, [facility] SLP Acute Rehab Services Pager [phone] Office [phone]

## 2019-03-23 NOTE — Progress Notes (Addendum)
Peggy Welch   DOB:[birth year]   PI#:[id number]   AYT#:[id number]  Oncology follow up   Subjective: Pt is known to me, last seen by me in 03/2018. She has metastatic breast cancer, denied treatment and lost f/u since last visit. She presented with worening cough, dyspnea and dysphagia.  She is slightly confused when I saw her, denies any pain.  She states her appetite is low, did not want to eat dinner.   Objective:  Vitals:   03/23/19 0621 03/23/19 1425  BP: 124/69 122/71  Pulse: 92 81  Resp: 20 18  Temp: 98.6 F (37 C) 98.3 F (36.8 C)  SpO2: 92% 92%    Body mass index is 26.41 kg/m.  Intake/Output Summary (Last 24 hours) at 03/23/2019 1758 Last data filed at 03/23/2019 1000 Gross per 24 hour  Intake 3134.8 ml  Output 1050 ml  Net 2084.8 ml     Sclerae unicteric  Oropharynx clear  No peripheral adenopathy  Lungs clear -- no rales or rhonchi, decreased breath sounds on bilateral lung bases  Heart regular rate and rhythm  Abdomen benign  MSK no focal spinal tenderness, (+) significant right arm lymphedema  Neuro nonfocal  Breast exam: Her entire right breast is occupied by tumor, with skin hyperpigmentation, and a small open wound in the inferior inner quadrant which is covered by bandage.  CBG (last 3)  No results for input(s): GLUCAP in the last 72 hours.   Labs:  Lab Results  Component Value Date   WBC 6.2 03/23/2019   HGB 12.3 03/23/2019   HCT 38.4 03/23/2019   MCV 96.7 03/23/2019   PLT 343 03/23/2019   NEUTROABS 3.3 03/22/2019    Urine Studies No results for input(s): UHGB, CRYS in the last 72 hours.  Invalid input(s): UACOL, UAPR, USPG, UPH, UTP, UGL, UKET, UBIL, UNIT, UROB, ULEU, UEPI, UWBC, URBC, UBAC, CAST, UCOM, BILUA  Basic Metabolic Panel: Recent Labs  Lab 03/22/19 1831 03/23/19 0755  NA 134* 138  K 2.5* 3.0*  CL 89* 100  CO2 33* 28  GLUCOSE 101* 87  BUN 5* 5*  CREATININE 0.64 0.55  CALCIUM 13.6* 11.6*  MG 2.0  --   PHOS 2.0* 2.5    GFR Estimated Creatinine Clearance: 54.6 mL/min (by C-G formula based on SCr of 0.55 mg/dL). Liver Function Tests: Recent Labs  Lab 03/22/19 1831 03/23/19 0755  AST 59*  --   ALT 30  --   ALKPHOS 71  --   BILITOT 1.3*  --   PROT 7.9  --   ALBUMIN 4.1 3.5   No results for input(s): LIPASE, AMYLASE in the last 168 hours. No results for input(s): AMMONIA in the last 168 hours. Coagulation profile Recent Labs  Lab 03/22/19 2114  INR 1.1    CBC: Recent Labs  Lab 03/22/19 1831 03/23/19 0755  WBC 5.8 6.2  NEUTROABS 3.3  --   HGB 13.5 12.3  HCT 42.3 38.4  MCV 94.6 96.7  PLT 357 343   Cardiac Enzymes: No results for input(s): CKTOTAL, CKMB, CKMBINDEX, TROPONINI in the last 168 hours. BNP: Invalid input(s): POCBNP CBG: No results for input(s): GLUCAP in the last 168 hours. D-Dimer No results for input(s): DDIMER in the last 72 hours. Hgb A1c No results for input(s): HGBA1C in the last 72 hours. Lipid Profile No results for input(s): CHOL, HDL, LDLCALC, TRIG, CHOLHDL, LDLDIRECT in the last 72 hours. Thyroid function studies No results for input(s): TSH, T4TOTAL, T3FREE, THYROIDAB in the last  72 hours.  Invalid input(s): FREET3 Anemia work up No results for input(s): VITAMINB12, FOLATE, FERRITIN, TIBC, IRON, RETICCTPCT in the last 72 hours. Microbiology Recent Results (from the past 240 hour(s))  SARS Coronavirus 2 (CEPHEID - Performed in [place] hospital lab), Hosp Order     Status: None   Collection Time: 03/22/19  5:57 PM   Specimen: Nasopharyngeal Swab  Result Value Ref Range Status   SARS Coronavirus 2 NEGATIVE NEGATIVE Final    Comment: (NOTE) If result is NEGATIVE SARS-CoV-2 target nucleic acids are NOT DETECTED. The SARS-CoV-2 RNA is generally detectable in upper and lower  respiratory specimens during the acute phase of infection. The lowest  concentration of SARS-CoV-2 viral copies this assay can detect is 250  copies / mL. A negative result  does not preclude SARS-CoV-2 infection  and should not be used as the sole basis for treatment or other  patient management decisions.  A negative result may occur with  improper specimen collection / handling, submission of specimen other  than nasopharyngeal swab, presence of viral mutation(s) within the  areas targeted by this assay, and inadequate number of viral copies  (<250 copies / mL). A negative result must be combined with clinical  observations, patient history, and epidemiological information. If result is POSITIVE SARS-CoV-2 target nucleic acids are DETECTED. The SARS-CoV-2 RNA is generally detectable in upper and lower  respiratory specimens dur ing the acute phase of infection.  Positive  results are indicative of active infection with SARS-CoV-2.  Clinical  correlation with patient history and other diagnostic information is  necessary to determine patient infection status.  Positive results do  not rule out bacterial infection or co-infection with other viruses. If result is PRESUMPTIVE POSTIVE SARS-CoV-2 nucleic acids MAY BE PRESENT.   A presumptive positive result was obtained on the submitted specimen  and confirmed on repeat testing.  While 2019 novel coronavirus  (SARS-CoV-2) nucleic acids may be present in the submitted sample  additional confirmatory testing may be necessary for epidemiological  and / or clinical management purposes  to differentiate between  SARS-CoV-2 and other Sarbecovirus currently known to infect humans.  If clinically indicated additional testing with an alternate test  methodology [phone]) is advised. The SARS-CoV-2 RNA is generally  detectable in upper and lower respiratory sp ecimens during the acute  phase of infection. The expected result is Negative. Fact Sheet for Patients:  StrictlyIdeas.no Fact Sheet for Healthcare Providers: BankingDealers.co.za This test is not yet approved or  cleared by the [place] FDA and has been authorized for detection and/or diagnosis of SARS-CoV-2 by FDA under an Emergency Use Authorization (EUA).  This EUA will remain in effect (meaning this test can be used) for the duration of the COVID-19 declaration under Section 564(b)(1) of the Act, 21 U.S.C. section 360bbb-3(b)(1), unless the authorization is terminated or revoked sooner. Performed at [facility], [place] [address] [place], [place] [address]       Studies:  Dg Chest 2 View  Result Date: 03/22/2019 CLINICAL DATA:  78 y.o female sent over from PCP for difficulty swallowing, productive coughing, and low 02. 95% RA in triage. Denies chest pain or shob. Hx. Asthma, HTN, PNA, and rt breast cancer EXAM: CHEST - 2 VIEW COMPARISON:  Chest CT, 12/14/2017. Chest radiographs, 11/30/2014 and earlier. FINDINGS: Moderate bilateral pleural effusions obscure the hemidiaphragms and most of the heart borders. There is additional lung base opacity consistent with atelectasis. Remainder of the lungs is clear. No  pneumothorax. Cardiac silhouette is top-normal in size. No mediastinal or hilar masses. Skeletal structures are intact. IMPRESSION: 1. Moderate bilateral pleural effusions with associated basilar atelectasis. Cannot exclude underlying pneumonia. No evidence of pulmonary edema. Electronically Signed   By: Lajean Manes M.D.   On: 03/22/2019 17:42   Ct Angio Chest Pe W And/or Wo Contrast  Result Date: 03/22/2019 CLINICAL DATA:  Difficulty swallowing, productive cough, hypoxia. History of breast cancer. EXAM: CT ANGIOGRAPHY CHEST WITH CONTRAST TECHNIQUE: Multidetector CT imaging of the chest was performed using the standard protocol during bolus administration of intravenous contrast. Multiplanar CT image reconstructions and MIPs were obtained to evaluate the vascular anatomy. CONTRAST:  120mL OMNIPAQUE IOHEXOL 350 MG/ML SOLN COMPARISON:  Chest radiographs dated 03/22/2019. CT  chest dated 12/14/2017. FINDINGS: Cardiovascular: Satisfactory opacification of the bilateral pulmonary arteries to the lobar level. Lobar/segmental pulmonary embolism within a branch of the right lower lobe pulmonary artery (series 8/image 117). Overall clot burden is small. No evidence of right heart strain. No evidence of thoracic aortic aneurysm or dissection. Mild atherosclerotic calcifications of the aortic arch. The heart is normal in size.  Trace pericardial fluid. Mediastinum/Nodes: No suspicious mediastinal lymphadenopathy. Visualized thyroid is unremarkable. Lungs/Pleura: Evaluation of the lung parenchyma is constrained by respiratory motion. Within that constraint, there are no suspicious pulmonary nodules. Suspected mild perihilar edema in the bilateral upper lobes. Moderate to large bilateral pleural effusions. Associated areas of scattered atelectasis in the bilateral upper lobes with dependent atelectasis in the bilateral lower lobes. No pneumothorax. Upper Abdomen: Visualized upper abdomen is notable for prior cholecystectomy. Musculoskeletal: Abnormal soft tissue throughout the right breast and extending to the right axilla with involvement of the right pectoralis musculature (series 7/image 27), measuring approximately 4.5 x 14.5 cm in axial dimension. While some of this appearance may reflect radiation change, the overall appearance is considered concerning for infiltrating tumor which has progressed from the prior. Overlying skin thickening. Degenerative changes of the visualized thoracolumbar spine. Review of the MIP images confirms the above findings. IMPRESSION: Lobar/segmental pulmonary embolism within a branch of the right lower lobe pulmonary artery. Overall clot burden is small. No evidence of right heart strain. Mild interstitial edema. Moderate to large bilateral pleural effusions. Dependent atelectasis in the bilateral lower lobes. Abnormal soft tissue throughout the right breast,  extending to the right axilla, and involving the right pectoralis musculature. This appearance is concerning for infiltrating tumor which has progressed from the prior. Overlying skin thickening. These results were called by telephone at the time of interpretation on 03/22/2019 at 8:48 pm to Dr. Gareth Morgan, who verbally acknowledged these results. Aortic Atherosclerosis (ICD10-I70.0). Electronically Signed   By: Julian Hy M.D.   On: 03/22/2019 20:49   Dg Swallowing Func-speech Pathology  Result Date: 03/23/2019 Objective Swallowing Evaluation: Type of Study: MBS-Modified Barium Swallow Study  Patient Details Name: LEANER MORICI MRN: [id number] Date of Birth: [birth year] Today's Date: 03/23/2019 Time: SLP Start Time (ACUTE ONLY): 0810 -SLP Stop Time (ACUTE ONLY): 0840 SLP Time Calculation (min) (ACUTE ONLY): 30 min Past Medical History: Past Medical History: Diagnosis Date . Arthritis  . Asthma  . Asthma exacerbation 10/14/2011 . Blood transfusion without reported diagnosis  . Hepatitis C   from blood transfusion . Hypertension  . Pneumonia 4/02016 . rt breast ca dx'd 2019 Past Surgical History: Past Surgical History: Procedure Laterality Date . CHOLECYSTECTOMY  02/21/2002 . JOINT REPLACEMENT    Bilateral total knee replacements . knee replacements Bilateral  . Right Rotator Cuff Repair  08/12/2006 . TOTAL HIP ARTHROPLASTY Right 11/22/2014  dr Lorin Mercy . TOTAL HIP ARTHROPLASTY Right 11/22/2014  Procedure: TOTAL HIP ARTHROPLASTY ANTERIOR APPROACH;  Surgeon: Marybelle Killings, MD;  Location: [facility];  Service: Orthopedics;  Laterality: Right; HPI: pt is a 78 yo female with h/o breast cancer admitted to [facility] with dysphagia.  Pt found to have hypoxia also.  MBS ordered.  Pt reports dysphagia clinically with symptoms including coughing on water immediately after swallowing.  Pt CT chest showed "Abnormal soft tissue throughout the right breast and extending to the right axilla with involvement of the right pectoralis  musculature (series 7/image 27), measuring approximately 4.5 x 14.5 cm in axial dimension. While some of this appearance may reflect radiation change, the overall appearance is considered concerning for infiltrating tumor which has progressed from prior".  Pt is also positive for pulmonary embolism.  She also has bilateral pleural effusions.  Per MD notes, pt has not followed oncology for one year and does not want further treatment.  Subjective: pt awake in chair, very HOH Assessment / Plan / Recommendation CHL IP CLINICAL IMPRESSIONS 03/23/2019 Clinical Impression Patient presents with mild pharyngeal dysphagia with motor impairments but no aspiration.  Pt's dysphagia marked by decreased timing of laryngeal closure which allows consistent laryngeal penetration of thin liquids and pharyngeal retention clearance.  She is however protective of her airway and independently conducts dry swallows with breath hold to clear larynx/pharynx of liquid residual.  Indequate epiglottic deflection negatively impacts solids/puree pharyngeal clearance although pt is sensate.  Reflexive dry swallow decreased solid/puree residuals and liquid "wash" helpful to clear pharynx.  Postures attempted (head turn right and chin tuck) although neck ROM very limited thus not adequately performed nor were they helpful to prevent residuals or decrease amount of laryngeal penetration.  Pt with increased aspiration risk with liquid using chin tuck as it contributes to spillage of residuals from pyriform sinus into larynx.  Pt is compensating well for her dysphagia without airway infiltration.  Recommend pt maximize her liquid nutritional intake as liquids clear pharynx easier than solids.  Pt reports she has been drinking liquid supplements at home.  Recommend dys3/thin diet with strict aspiration precautions.  Given pt reports xerostomia, recommend start all po with liquids.  Question if pt's tumor progression may be impacting her pharyngeal and  esophageal swallowing ability.  Will follow up for pt education to compensation strategies.  Note per review of chart, she did not want further treatment of her breast cancer thus compensation for dysphagia is likely optimal route for her goals.Of note, pt did cough x1 during MBS but there was not barium visualized in her trachea. SLP Visit Diagnosis Dysphagia, pharyngoesophageal phase (R13.14) Attention and concentration deficit following -- Frontal lobe and executive function deficit following -- Impact on safety and function Mild aspiration risk   CHL IP TREATMENT RECOMMENDATION 12/04/2014 Treatment Recommendations No treatment recommended at this time   No flowsheet data found. CHL IP DIET RECOMMENDATION 03/23/2019 SLP Diet Recommendations Dysphagia 3 (Mech soft) solids;Thin liquid Liquid Administration via Cup;Straw Medication Administration Crushed with puree Compensations Slow rate;Small sips/bites;Multiple dry swallows after each bite/sip;Follow solids with liquid Postural Changes Remain semi-upright after after feeds/meals (Comment);Seated upright at 90 degrees   CHL IP OTHER RECOMMENDATIONS 03/23/2019 Recommended Consults -- Oral Care Recommendations Oral care BID Other Recommendations --   CHL IP FOLLOW UP RECOMMENDATIONS 12/04/2014 Follow up Recommendations None   CHL IP FREQUENCY AND DURATION 03/23/2019 Speech Therapy Frequency (ACUTE ONLY) min 1 x/week Treatment  Duration 1 week      CHL IP ORAL PHASE 03/23/2019 Oral Phase Impaired Oral - Pudding Teaspoon -- Oral - Pudding Cup -- Oral - Honey Teaspoon -- Oral - Honey Cup -- Oral - Nectar Teaspoon -- Oral - Nectar Cup WFL;Piecemeal swallowing Oral - Nectar Straw -- Oral - Thin Teaspoon WFL;Piecemeal swallowing Oral - Thin Cup [facility];Piecemeal swallowing Oral - Thin Straw Piecemeal swallowing;WFL Oral - Puree Lingual pumping Oral - Mech Soft WFL Oral - Regular -- Oral - Multi-Consistency -- Oral - Pill -- Oral Phase - Comment --  CHL IP PHARYNGEAL PHASE 03/23/2019  Pharyngeal Phase -- Pharyngeal- Pudding Teaspoon -- Pharyngeal -- Pharyngeal- Pudding Cup -- Pharyngeal -- Pharyngeal- Honey Teaspoon -- Pharyngeal -- Pharyngeal- Honey Cup -- Pharyngeal -- Pharyngeal- Nectar Teaspoon -- Pharyngeal -- Pharyngeal- Nectar Cup -- Pharyngeal -- Pharyngeal- Nectar Straw -- Pharyngeal -- Pharyngeal- Thin Teaspoon WFL Pharyngeal Material does not enter airway Pharyngeal- Thin Cup Reduced airway/laryngeal closure;Penetration/Aspiration during swallow;Penetration/Aspiration before swallow;Pharyngeal residue - pyriform;Pharyngeal residue - valleculae Pharyngeal Material enters airway, remains ABOVE vocal cords then ejected out Pharyngeal- Thin Straw Reduced airway/laryngeal closure;Pharyngeal residue - valleculae;Pharyngeal residue - pyriform Pharyngeal Material enters airway, remains ABOVE vocal cords then ejected out Pharyngeal- Puree Reduced epiglottic inversion;Reduced tongue base retraction;Pharyngeal residue - valleculae;Pharyngeal residue - pyriform Pharyngeal Material does not enter airway Pharyngeal- Mechanical Soft Reduced tongue base retraction;Reduced epiglottic inversion;Pharyngeal residue - valleculae;Pharyngeal residue - pyriform Pharyngeal Material does not enter airway Pharyngeal- Regular -- Pharyngeal -- Pharyngeal- Multi-consistency -- Pharyngeal -- Pharyngeal- Pill -- Pharyngeal -- Pharyngeal Comment pt with minimal neck ROM and thus chin tuck nor head turn right were nof performed adequately and they were not helpful in preventing laryngeal penetration or decreasing pharyngeal stasis; reflexive dry swallows with breath hold with liquids protective of her airway and liquid swallow post-swallow of food decreased pharyngeal residuals  CHL IP CERVICAL ESOPHAGEAL PHASE 03/23/2019 Cervical Esophageal Phase Impaired Pudding Teaspoon -- Pudding Cup -- Honey Teaspoon -- Honey Cup -- Nectar Teaspoon -- Nectar Cup -- Nectar Straw -- Thin Teaspoon -- Thin Cup -- Thin Straw -- Puree --  Mechanical Soft -- Regular -- Multi-consistency -- Pill -- Cervical Esophageal Comment With puree, pt appeared with halting below CP but this did clear with liquids.  Upon esophageal sweep, pt appeared with minimal esophagus - suspect WFL.  Radiologist not present to confirm findings. Luanna Salk, MS [facility] SLP Acute Rehab Services Pager [phone] Office [phone] Macario Golds 03/23/2019, 11:08 AM               Assessment: 78 y.o.   1. RLL PE 2.  Bilateral pleural effusion 3.  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  Hypercalcemia, likely related to her malignancy 5.  Hypokalemia, replaced 6. HTN 7. Dysphagia, unclear etiology    Plan:  -Agree with anticoagulation with IV heparin drip, will consider switching to Lovenox or Xarelto on discharge, coumadin will be difficult to manage due to her noncompliance  -I recommend Zometa infusion for her malignant hypercalcemia, I ordered for her.  -Given is a moderate to large sized pleural effusion, and her dyspnea, I recommend thoracentesis for symptom relieve. It would be more difficult to do the thoracentesis after discharge when she is on Xarelto  -Patient has previously declined treatment, she did try anastrozole and Ibrance, with side effects of fatigue. I do not think she will take these treatment.  -I recommend palliative or hospice service on discharge. I called  her daughter and left her a VM for her to call me back tomorrow. I did speak with her sister, her sister she was doing okay at home until she came to hospital, she was still able to do her ADLs. She does have adequate support at home per her sister. I do not think she will f/u with [place] as we suggest, and palliative care or hospice could certainly help her management her symptoms at home.  -will f/u.    Truitt Merle, MD 03/23/2019  5:58 PM

## 2019-03-23 NOTE — Progress Notes (Signed)
PROGRESS NOTE    Peggy Welch  VUY:[id number] DOB: June 11, 1941 DOA: 03/22/2019 PCP: Lucianne Lei, MD   Brief Narrative:  Peggy Welch is Peggy Welch 78 y.o. 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  She has also had some shortness of breath.  Patient has Mende Biswell known history of right-sided breast cancer with involvement of the lung and bones.  Previously seen by oncology Dr. Burr Medico, but has not followed up in over Deondra Labrador year.  Per daughter, patient expressed wishes to avoid further treatment regarding her cancer.  Patient otherwise denies any recent subjective fevers, diaphoresis, chest pain, abdominal pain, diarrhea, constipation, or dysuria.  ED Course:  Initial vitals showed BP 135/117, pulse 95, RR 16, temp 99.2 Fahrenheit, SPO2 97% on room air.  Labs are notable for potassium 2.5, magnesium 2.0, sodium 134, chloride 89, bicarb 33, BUN 5, creatinine 0.64, calcium 13.6, albumin 4.1, phosphorus 2.0.  WBC 5.8, hemoglobin 13.5, platelets 357,000.  SARS-CoV-2 test was obtained and pending.  Portable chest x-ray showed moderate bilateral pleural effusions with basilar atelectasis.  CTA chest PE study showed Noriel Guthrie lobar/segmental PE within Othella Slappey branch of the right lower lobe pulmonary artery with overall small clot burden or evidence of right heart strain.  Mild interstitial edema and moderate to large bilateral pleural effusions are noted with dependent atelectasis in bilateral lower lobes.  Abnormal soft tissue throughout the right breast extending to the right axilla and involving the right pectoralis musculature was noted with appearance concerning for progression of infiltrating tumor when compared to prior.  Patient was given 1 L normal saline, 40 mEq of oral potassium, IV K 10 mEq x 4 runs, and started on IV  heparin drip.  The hospitalist service was consulted to admit for further evaluation and management.  Assessment & Plan:   Principal Problem:   Hypercalcemia of malignancy Active Problems:   Hypokalemia   Breast cancer of lower-inner quadrant of right female breast ([place])   Pulmonary embolism ([place])   Dysphagia   Peggy Welch is Jojuan Champney 78 y.o. 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  Hypercalcemia: Moderate hypercalcemia 13.6 on admission likely secondary to malignancy in addition to HCTZ use   Continue IVF  Will give dose of lasix x1 imaging findings concerning for interstitial edema Appreciate oncology who has recommended adding zometa Continue maintenance fluids overnight, monitor strict I/O's Hold home HCTZ  Hypokalemia/hypophosphatemia: Improved today Will continue to replace and ofllow  Lobar/segmental PE within branch of the right lower lobe pulmonary artery: Likely due to malignancy.  Has been started on IV heparin.   Follow LE [place] bilaterally Oncology recommending lovenox vs xarelto at d/c  Right breast cancer with metastases to lung and bones and RUE lymphedema: CT scan shows changes concerning for progression of infiltrating tumor.  Patient was previously followed by oncology, Dr. Burr Medico, but has not followed up in over Alantis Bethune year.  Per discussion with daughter, patient has expressed to her that she did not want continued therapy for cancer and that is why she stopped following with oncology. - appreciate oncology recommendations, will consult palliative care   Bilateral pleural effusions: Suspect due to malignancy.  She is currently saturating well on room air does not appear to  have respiratory distress.   Will order thoracentesis by IR  Dysphagia   Poor appetite: Reported recent difficulty with swallowing and coughing.  Possibly related to  tumor progression.  Have ordered SLP/swallow evaluation. Recommending dysphagia 2, thin liquid Pt c/o poor appetite and asking for something for appetite - start remeron  Hypertension: Hypertensive on admission.  Holding home HCTZ as above.  Use IV hydralazine as needed.  Asthma: No active wheezing.  Do not think presenting symptoms are related to asthma exacerbation.  Can continue albuterol as needed.  DVT prophylaxis: heparin Code Status: DNR Family Communication: daughter, Marcie Bal (requests I call sister tomorrow) Disposition Plan: pt requires further workup and treatment including thoracentesis by IR, continue treatment of hypercalcemia with IVF, zometa, IV heparin for PE with need for thoracentesis, and awaiting palliative care c/s as well as oncology s/o.  Continues to require inpatient management.   Consultants:   Oncology  Palliative care   Procedures:   none  Antimicrobials:  Anti-infectives (From admission, onward)   None     Subjective: Hard of hearing Describes frustration with oncology in past and sounds like this is why she may not have returned? (I wonder if some misunderstanding?). C/o poor appetite, asking for med for this  Objective: Vitals:   03/23/19 0621 03/23/19 1425 03/23/19 1800 03/23/19 1844  BP: 124/69 122/71    Pulse: 92 81    Resp: 20 18    Temp: 98.6 F (37 C) 98.3 F (36.8 C)    TempSrc: Oral Oral    SpO2: 92% 92% 92% 95%  Weight: 69.8 kg     Height:        Intake/Output Summary (Last 24 hours) at 03/23/2019 1953 Last data filed at 03/23/2019 1821 Gross per 24 hour  Intake 3494.8 ml  Output 1050 ml  Net 2444.8 ml   Filed Weights   03/22/19 2141 03/22/19 2344 03/23/19 0621  Weight: 68.5 kg 68.9 kg 69.8 kg    Examination:  General exam: Appears calm and comfortable  Respiratory system: Clear to auscultation. Respiratory effort normal. Cardiovascular system: S1 & S2 heard, RRR. Gastrointestinal system: Abdomen is  nondistended, soft and nontender Central nervous system: Alert and oriented. No focal neurological deficits. Extremities: no LEE Skin: No rashes, lesions or ulcers Psychiatry: Judgement and insight appear normal. Mood & affect appropriate.     Data Reviewed: I have personally reviewed following labs and imaging studies  CBC: Recent Labs  Lab 03/22/19 1831 03/23/19 0755  WBC 5.8 6.2  NEUTROABS 3.3  --   HGB 13.5 12.3  HCT 42.3 38.4  MCV 94.6 96.7  PLT 357 865   Basic Metabolic Panel: Recent Labs  Lab 03/22/19 1831 03/23/19 0755  NA 134* 138  K 2.5* 3.0*  CL 89* 100  CO2 33* 28  GLUCOSE 101* 87  BUN 5* 5*  CREATININE 0.64 0.55  CALCIUM 13.6* 11.6*  MG 2.0  --   PHOS 2.0* 2.5   GFR: Estimated Creatinine Clearance: 54.6 mL/min (by C-G formula based on SCr of 0.55 mg/dL). Liver Function Tests: Recent Labs  Lab 03/22/19 1831 03/23/19 0755  AST 59*  --   ALT 30  --   ALKPHOS 71  --   BILITOT 1.3*  --   PROT 7.9  --   ALBUMIN 4.1 3.5   No results for input(s): LIPASE, AMYLASE in the last 168 hours. No results for input(s): AMMONIA in the last 168 hours. Coagulation Profile: Recent Labs  Lab 03/22/19 2114  INR 1.1   Cardiac Enzymes: No results for input(s): CKTOTAL, CKMB, CKMBINDEX, TROPONINI in the last 168 hours. BNP (last 3 results) No results for input(s): PROBNP in the last 8760 hours. HbA1C: No results for input(s): HGBA1C in the last 72 hours. CBG: No results for input(s): GLUCAP in the last 168 hours. Lipid Profile: No results for input(s): CHOL, HDL, LDLCALC, TRIG, CHOLHDL, LDLDIRECT in the last 72 hours. Thyroid Function Tests: No results for input(s): TSH, T4TOTAL, FREET4, T3FREE, THYROIDAB in the last 72 hours. Anemia Panel: No results for input(s): VITAMINB12, FOLATE, FERRITIN, TIBC, IRON, RETICCTPCT in the last 72 hours. Sepsis Labs: No results for input(s): PROCALCITON, LATICACIDVEN in the last 168 hours.  Recent Results (from the  past 240 hour(s))  SARS Coronavirus 2 (CEPHEID - Performed in [place] hospital lab), Hosp Order     Status: None   Collection Time: 03/22/19  5:57 PM   Specimen: Nasopharyngeal Swab  Result Value Ref Range Status   SARS Coronavirus 2 NEGATIVE NEGATIVE Final    Comment: (NOTE) If result is NEGATIVE SARS-CoV-2 target nucleic acids are NOT DETECTED. The SARS-CoV-2 RNA is generally detectable in upper and lower  respiratory specimens during the acute phase of infection. The lowest  concentration of SARS-CoV-2 viral copies this assay can detect is 250  copies / mL. Maleeah Crossman negative result does not preclude SARS-CoV-2 infection  and should not be used as the sole basis for treatment or other  patient management decisions.  Keiji Melland negative result may occur with  improper specimen collection / handling, submission of specimen other  than nasopharyngeal swab, presence of viral mutation(s) within the  areas targeted by this assay, and inadequate number of viral copies  (<250 copies / mL). Mccauley Diehl negative result must be combined with clinical  observations, patient history, and epidemiological information. If result is POSITIVE SARS-CoV-2 target nucleic acids are DETECTED. The SARS-CoV-2 RNA is generally detectable in upper and lower  respiratory specimens dur ing the acute phase of infection.  Positive  results are indicative of active infection with SARS-CoV-2.  Clinical  correlation with patient history and other diagnostic information is  necessary to determine patient infection status.  Positive results do  not rule out bacterial infection or co-infection with other viruses. If result is PRESUMPTIVE POSTIVE SARS-CoV-2 nucleic acids MAY BE PRESENT.   Quintina Hakeem presumptive positive result was obtained on the submitted specimen  and confirmed on repeat testing.  While 2019 novel coronavirus  (SARS-CoV-2) nucleic acids may be present in the submitted sample  additional confirmatory testing may be necessary for  epidemiological  and / or clinical management purposes  to differentiate between  SARS-CoV-2 and other Sarbecovirus currently known to infect humans.  If clinically indicated additional testing with an alternate test  methodology [phone]) is advised. The SARS-CoV-2 RNA is generally  detectable in upper and lower respiratory sp ecimens during the acute  phase of infection. The expected result is Negative. Fact Sheet for Patients:  StrictlyIdeas.no Fact Sheet for Healthcare Providers: BankingDealers.co.za This test is not yet approved or cleared by the [place] FDA and has been authorized for detection and/or diagnosis of SARS-CoV-2 by FDA under an Emergency Use Authorization (EUA).  This EUA will remain in effect (meaning this test can be used) for the duration of the COVID-19 declaration under Section 564(b)(1) of the Act, 21 U.S.C. section 360bbb-3(b)(1), unless the authorization is terminated or revoked sooner. Performed at [facility], [place] [address], [place], [place] [address]  Radiology Studies: Dg Chest 2 View  Result Date: 03/22/2019 CLINICAL DATA:  78 y.o female sent over from PCP for difficulty swallowing, productive coughing, and low 02. 95% RA in triage. Denies chest pain or shob. Hx. Asthma, HTN, PNA, and rt breast cancer EXAM: CHEST - 2 VIEW COMPARISON:  Chest CT, 12/14/2017. Chest radiographs, 11/30/2014 and earlier. FINDINGS: Moderate bilateral pleural effusions obscure the hemidiaphragms and most of the heart borders. There is additional lung base opacity consistent with atelectasis. Remainder of the lungs is clear. No pneumothorax. Cardiac silhouette is top-normal in size. No mediastinal or hilar masses. Skeletal structures are intact. IMPRESSION: 1. Moderate bilateral pleural effusions with associated basilar atelectasis. Cannot exclude underlying pneumonia. No evidence of pulmonary edema.  Electronically Signed   By: Lajean Manes M.D.   On: 03/22/2019 17:42   Ct Angio Chest Pe W And/or Wo Contrast  Result Date: 03/22/2019 CLINICAL DATA:  Difficulty swallowing, productive cough, hypoxia. History of breast cancer. EXAM: CT ANGIOGRAPHY CHEST WITH CONTRAST TECHNIQUE: Multidetector CT imaging of the chest was performed using the standard protocol during bolus administration of intravenous contrast. Multiplanar CT image reconstructions and MIPs were obtained to evaluate the vascular anatomy. CONTRAST:  132mL OMNIPAQUE IOHEXOL 350 MG/ML SOLN COMPARISON:  Chest radiographs dated 03/22/2019. CT chest dated 12/14/2017. FINDINGS: Cardiovascular: Satisfactory opacification of the bilateral pulmonary arteries to the lobar level. Lobar/segmental pulmonary embolism within Jrake Rodriquez branch of the right lower lobe pulmonary artery (series 8/image 117). Overall clot burden is small. No evidence of right heart strain. No evidence of thoracic aortic aneurysm or dissection. Mild atherosclerotic calcifications of the aortic arch. The heart is normal in size.  Trace pericardial fluid. Mediastinum/Nodes: No suspicious mediastinal lymphadenopathy. Visualized thyroid is unremarkable. Lungs/Pleura: Evaluation of the lung parenchyma is constrained by respiratory motion. Within that constraint, there are no suspicious pulmonary nodules. Suspected mild perihilar edema in the bilateral upper lobes. Moderate to large bilateral pleural effusions. Associated areas of scattered atelectasis in the bilateral upper lobes with dependent atelectasis in the bilateral lower lobes. No pneumothorax. Upper Abdomen: Visualized upper abdomen is notable for prior cholecystectomy. Musculoskeletal: Abnormal soft tissue throughout the right breast and extending to the right axilla with involvement of the right pectoralis musculature (series 7/image 27), measuring approximately 4.5 x 14.5 cm in axial dimension. While some of this appearance may reflect  radiation change, the overall appearance is considered concerning for infiltrating tumor which has progressed from the prior. Overlying skin thickening. Degenerative changes of the visualized thoracolumbar spine. Review of the MIP images confirms the above findings. IMPRESSION: Lobar/segmental pulmonary embolism within Evett Kassa branch of the right lower lobe pulmonary artery. Overall clot burden is small. No evidence of right heart strain. Mild interstitial edema. Moderate to large bilateral pleural effusions. Dependent atelectasis in the bilateral lower lobes. Abnormal soft tissue throughout the right breast, extending to the right axilla, and involving the right pectoralis musculature. This appearance is concerning for infiltrating tumor which has progressed from the prior. Overlying skin thickening. These results were called by telephone at the time of interpretation on 03/22/2019 at 8:48 pm to Dr. Gareth Morgan, who verbally acknowledged these results. Aortic Atherosclerosis (ICD10-I70.0). Electronically Signed   By: Julian Hy M.D.   On: 03/22/2019 20:49   Dg Swallowing Func-speech Pathology  Result Date: 03/23/2019 Objective Swallowing Evaluation: Type of Study: MBS-Modified Barium Swallow Study  Patient Details Name: ANNELISE MCCOY MRN: [id number] Date of Birth: 1941-08-13 Today's Date: 03/23/2019 Time: SLP Start Time (ACUTE ONLY): 0630 -SLP Stop  Time (ACUTE ONLY): 0840 SLP Time Calculation (min) (ACUTE ONLY): 30 min Past Medical History: Past Medical History: Diagnosis Date  Arthritis   Asthma   Asthma exacerbation 10/14/2011  Blood transfusion without reported diagnosis   Hepatitis C   from blood transfusion  Hypertension   Pneumonia 4/02016  rt breast ca dx'd 2019 Past Surgical History: Past Surgical History: Procedure Laterality Date  CHOLECYSTECTOMY  02/21/2002  JOINT REPLACEMENT    Bilateral total knee replacements  knee replacements Bilateral   Right Rotator Cuff Repair  08/12/2006   TOTAL HIP ARTHROPLASTY Right 11/22/2014  dr Lorin Mercy  TOTAL HIP ARTHROPLASTY Right 11/22/2014  Procedure: TOTAL HIP ARTHROPLASTY ANTERIOR APPROACH;  Surgeon: Marybelle Killings, MD;  Location: [facility];  Service: Orthopedics;  Laterality: Right; HPI: pt is Kaedon Fanelli 78 yo female with h/o breast cancer admitted [facility] with dysphagia.  Pt found to have hypoxia also.  MBS ordered.  Pt reports dysphagia clinically with symptoms including coughing on water immediately after swallowing.  Pt CT chest showed "Abnormal soft tissue throughout the right breast and extending to the right axilla with involvement of the right pectoralis musculature (series 7/image 27), measuring approximately 4.5 x 14.5 cm in axial dimension. While some of this appearance may reflect radiation change, the overall appearance is considered concerning for infiltrating tumor which has progressed from prior".  Pt is also positive for pulmonary embolism.  She also has bilateral pleural effusions.  Per MD notes, pt has not followed oncology for one year and does not want further treatment.  Subjective: pt awake in chair, very HOH Assessment / Plan / Recommendation CHL IP CLINICAL IMPRESSIONS 03/23/2019 Clinical Impression Patient presents with mild pharyngeal dysphagia with motor impairments but no aspiration.  Pt's dysphagia marked by decreased timing of laryngeal closure which allows consistent laryngeal penetration of thin liquids and pharyngeal retention clearance.  She is however protective of her airway and independently conducts dry swallows with breath hold to clear larynx/pharynx of liquid residual.  Indequate epiglottic deflection negatively impacts solids/puree pharyngeal clearance although pt is sensate.  Reflexive dry swallow decreased solid/puree residuals and liquid "wash" helpful to clear pharynx.  Postures attempted (head turn right and chin tuck) although neck ROM very limited thus not adequately performed nor were they helpful to prevent residuals or  decrease amount of laryngeal penetration.  Pt with increased aspiration risk with liquid using chin tuck as it contributes to spillage of residuals from pyriform sinus into larynx.  Pt is compensating well for her dysphagia without airway infiltration.  Recommend pt maximize her liquid nutritional intake as liquids clear pharynx easier than solids.  Pt reports she has been drinking liquid supplements at home.  Recommend dys3/thin diet with strict aspiration precautions.  Given pt reports xerostomia, recommend start all po with liquids.  Question if pt's tumor progression may be impacting her pharyngeal and esophageal swallowing ability.  Will follow up for pt education to compensation strategies.  Note per review of chart, she did not want further treatment of her breast cancer thus compensation for dysphagia is likely optimal route for her goals.Of note, pt did cough x1 during MBS but there was not barium visualized in her trachea. SLP Visit Diagnosis Dysphagia, pharyngoesophageal phase (R13.14) Attention and concentration deficit following -- Frontal lobe and executive function deficit following -- Impact on safety and function Mild aspiration risk   CHL IP TREATMENT RECOMMENDATION 12/04/2014 Treatment Recommendations No treatment recommended at this time   No flowsheet data found. CHL IP DIET RECOMMENDATION  03/23/2019 SLP Diet Recommendations Dysphagia 3 (Mech soft) solids;Thin liquid Liquid Administration via Cup;Straw Medication Administration Crushed with puree Compensations Slow rate;Small sips/bites;Multiple dry swallows after each bite/sip;Follow solids with liquid Postural Changes Remain semi-upright after after feeds/meals (Comment);Seated upright at 90 degrees   CHL IP OTHER RECOMMENDATIONS 03/23/2019 Recommended Consults -- Oral Care Recommendations Oral care BID Other Recommendations --   CHL IP FOLLOW UP RECOMMENDATIONS 12/04/2014 Follow up Recommendations None   CHL IP FREQUENCY AND DURATION 03/23/2019  Speech Therapy Frequency (ACUTE ONLY) min 1 x/week Treatment Duration 1 week      CHL IP ORAL PHASE 03/23/2019 Oral Phase Impaired Oral - Pudding Teaspoon -- Oral - Pudding Cup -- Oral - Honey Teaspoon -- Oral - Honey Cup -- Oral - Nectar Teaspoon -- Oral - Nectar Cup WFL;Piecemeal swallowing Oral - Nectar Straw -- Oral - Thin Teaspoon WFL;Piecemeal swallowing Oral - Thin Cup [facility];Piecemeal swallowing Oral - Thin Straw Piecemeal swallowing;WFL Oral - Puree Lingual pumping Oral - Mech Soft WFL Oral - Regular -- Oral - Multi-Consistency -- Oral - Pill -- Oral Phase - Comment --  CHL IP PHARYNGEAL PHASE 03/23/2019 Pharyngeal Phase -- Pharyngeal- Pudding Teaspoon -- Pharyngeal -- Pharyngeal- Pudding Cup -- Pharyngeal -- Pharyngeal- Honey Teaspoon -- Pharyngeal -- Pharyngeal- Honey Cup -- Pharyngeal -- Pharyngeal- Nectar Teaspoon -- Pharyngeal -- Pharyngeal- Nectar Cup -- Pharyngeal -- Pharyngeal- Nectar Straw -- Pharyngeal -- Pharyngeal- Thin Teaspoon WFL Pharyngeal Material does not enter airway Pharyngeal- Thin Cup Reduced airway/laryngeal closure;Penetration/Aspiration during swallow;Penetration/Aspiration before swallow;Pharyngeal residue - pyriform;Pharyngeal residue - valleculae Pharyngeal Material enters airway, remains ABOVE vocal cords then ejected out Pharyngeal- Thin Straw Reduced airway/laryngeal closure;Pharyngeal residue - valleculae;Pharyngeal residue - pyriform Pharyngeal Material enters airway, remains ABOVE vocal cords then ejected out Pharyngeal- Puree Reduced epiglottic inversion;Reduced tongue base retraction;Pharyngeal residue - valleculae;Pharyngeal residue - pyriform Pharyngeal Material does not enter airway Pharyngeal- Mechanical Soft Reduced tongue base retraction;Reduced epiglottic inversion;Pharyngeal residue - valleculae;Pharyngeal residue - pyriform Pharyngeal Material does not enter airway Pharyngeal- Regular -- Pharyngeal -- Pharyngeal- Multi-consistency -- Pharyngeal -- Pharyngeal- Pill --  Pharyngeal -- Pharyngeal Comment pt with minimal neck ROM and thus chin tuck nor head turn right were nof performed adequately and they were not helpful in preventing laryngeal penetration or decreasing pharyngeal stasis; reflexive dry swallows with breath hold with liquids protective of her airway and liquid swallow post-swallow of food decreased pharyngeal residuals  CHL IP CERVICAL ESOPHAGEAL PHASE 03/23/2019 Cervical Esophageal Phase Impaired Pudding Teaspoon -- Pudding Cup -- Honey Teaspoon -- Honey Cup -- Nectar Teaspoon -- Nectar Cup -- Nectar Straw -- Thin Teaspoon -- Thin Cup -- Thin Straw -- Puree -- Mechanical Soft -- Regular -- Multi-consistency -- Pill -- Cervical Esophageal Comment With puree, pt appeared with halting below CP but this did clear with liquids.  Upon esophageal sweep, pt appeared with minimal esophagus - suspect WFL.  Radiologist not present to confirm findings. Luanna Salk, MS [facility] SLP Acute Rehab Services Pager [phone] Office [phone] Macario Golds 03/23/2019, 11:08 AM                   Scheduled Meds:  sodium chloride flush  3 mL Intravenous Q12H   Continuous Infusions:  0.9 % NaCl with KCl 20 mEq / L 100 mL/hr at 03/23/19 1821   heparin 1,000 Units/hr (03/23/19 0647)   zoledronic acid (ZOMETA) IV       LOS: 1 day    Time spent: over 79 min    Fayrene Helper, MD  [facility] Pager AMION  If 7PM-7AM, please contact night-coverage [email or web address] Password [facility] 03/23/2019, 7:53 PM

## 2019-03-24 ENCOUNTER — Inpatient Hospital Stay (HOSPITAL_COMMUNITY): Payer: Medicare Other

## 2019-03-24 DIAGNOSIS — Z515 Encounter for palliative care: Secondary | ICD-10-CM

## 2019-03-24 DIAGNOSIS — I2694 Multiple subsegmental pulmonary emboli without acute cor pulmonale: Secondary | ICD-10-CM

## 2019-03-24 DIAGNOSIS — Z170 Estrogen receptor positive status [ER+]: Secondary | ICD-10-CM

## 2019-03-24 DIAGNOSIS — Z7189 Other specified counseling: Secondary | ICD-10-CM

## 2019-03-24 DIAGNOSIS — C50311 Malignant neoplasm of lower-inner quadrant of right female breast: Secondary | ICD-10-CM

## 2019-03-24 DIAGNOSIS — R1310 Dysphagia, unspecified: Secondary | ICD-10-CM

## 2019-03-24 DIAGNOSIS — Z66 Do not resuscitate: Secondary | ICD-10-CM

## 2019-03-24 LAB — LACTATE DEHYDROGENASE: LDH: 214 U/L — ABNORMAL HIGH (ref 98–192)

## 2019-03-24 LAB — CBC
HCT: 36.7 % (ref 36.0–46.0)
Hemoglobin: 11.3 g/dL — ABNORMAL LOW (ref 12.0–15.0)
MCH: 30.1 pg (ref 26.0–34.0)
MCHC: 30.8 g/dL (ref 30.0–36.0)
MCV: 97.9 fL (ref 80.0–100.0)
Platelets: 337 10*3/uL (ref 150–400)
RBC: 3.75 MIL/uL — ABNORMAL LOW (ref 3.87–5.11)
RDW: 14.6 % (ref 11.5–15.5)
WBC: 5.6 10*3/uL (ref 4.0–10.5)
nRBC: 0 % (ref 0.0–0.2)

## 2019-03-24 LAB — COMPREHENSIVE METABOLIC PANEL
ALT: 21 U/L (ref 0–44)
AST: 39 U/L (ref 15–41)
Albumin: 3.2 g/dL — ABNORMAL LOW (ref 3.5–5.0)
Alkaline Phosphatase: 48 U/L (ref 38–126)
Anion gap: 8 (ref 5–15)
BUN: 7 mg/dL — ABNORMAL LOW (ref 8–23)
CO2: 29 mmol/L (ref 22–32)
Calcium: 11.8 mg/dL — ABNORMAL HIGH (ref 8.9–10.3)
Chloride: 101 mmol/L (ref 98–111)
Creatinine, Ser: 0.6 mg/dL (ref 0.44–1.00)
GFR calc Af Amer: >60 mL/min (ref >60)
GFR calc non Af Amer: >60 mL/min (ref >60)
Glucose, Bld: 90 mg/dL (ref 70–99)
Potassium: 3 mmol/L — ABNORMAL LOW (ref 3.5–5.1)
Sodium: 138 mmol/L (ref 135–145)
Total Bilirubin: 1.1 mg/dL (ref 0.3–1.2)
Total Protein: 6.4 g/dL — ABNORMAL LOW (ref 6.5–8.1)

## 2019-03-24 LAB — BODY FLUID CELL COUNT WITH DIFFERENTIAL
Eos, Fluid: 1 %
Lymphs, Fluid: 72 %
Monocyte-Macrophage-Serous Fluid: 26 % — ABNORMAL LOW (ref 50–90)
Neutrophil Count, Fluid: 1 % (ref 0–25)
Total Nucleated Cell Count, Fluid: 674 cu mm (ref 0–1000)

## 2019-03-24 LAB — LACTATE DEHYDROGENASE, PLEURAL OR PERITONEAL FLUID: LD, Fluid: 193 U/L — ABNORMAL HIGH (ref 3–23)

## 2019-03-24 LAB — PHOSPHORUS: Phosphorus: 2.2 mg/dL — ABNORMAL LOW (ref 2.5–4.6)

## 2019-03-24 LAB — GLUCOSE, PLEURAL OR PERITONEAL FLUID: Glucose, Fluid: 102 mg/dL

## 2019-03-24 LAB — MAGNESIUM: Magnesium: 1.7 mg/dL (ref 1.7–2.4)

## 2019-03-24 LAB — HEPARIN LEVEL (UNFRACTIONATED): Heparin Unfractionated: 0.55 IU/mL (ref 0.30–0.70)

## 2019-03-24 MED ORDER — ENSURE ENLIVE PO LIQD
237.0000 mL | Freq: Two times a day (BID) | ORAL | Status: DC
  Administered 2019-03-24 – 2019-03-25 (×2): 237 mL via ORAL

## 2019-03-24 MED ORDER — ADULT MULTIVITAMIN W/MINERALS CH
1.0000 | ORAL_TABLET | Freq: Every day | ORAL | Status: DC
  Administered 2019-03-24 – 2019-03-26 (×3): 1 via ORAL
  Filled 2019-03-24 (×3): qty 1

## 2019-03-24 MED ORDER — K PHOS MONO-SOD PHOS DI & MONO 155-852-130 MG PO TABS
500.0000 mg | ORAL_TABLET | Freq: Three times a day (TID) | ORAL | Status: AC
  Administered 2019-03-24 – 2019-03-25 (×3): 500 mg via ORAL
  Filled 2019-03-24 (×3): qty 2

## 2019-03-24 MED ORDER — BOOST / RESOURCE BREEZE PO LIQD CUSTOM
1.0000 | ORAL | Status: DC
  Administered 2019-03-25 – 2019-03-26 (×2): 1 via ORAL

## 2019-03-24 MED ORDER — POTASSIUM CHLORIDE CRYS ER 20 MEQ PO TBCR
40.0000 meq | EXTENDED_RELEASE_TABLET | ORAL | Status: AC
  Administered 2019-03-24: 40 meq via ORAL
  Filled 2019-03-24 (×2): qty 2

## 2019-03-24 MED ORDER — LIDOCAINE HCL 1 % IJ SOLN
INTRAMUSCULAR | Status: AC
  Filled 2019-03-24: qty 20

## 2019-03-24 MED ORDER — GUAIFENESIN ER 600 MG PO TB12
600.0000 mg | ORAL_TABLET | Freq: Two times a day (BID) | ORAL | Status: DC
  Administered 2019-03-24 – 2019-03-26 (×5): 600 mg via ORAL
  Filled 2019-03-24 (×5): qty 1

## 2019-03-24 MED ORDER — SODIUM CHLORIDE 0.9 % IV SOLN
INTRAVENOUS | Status: AC
  Administered 2019-03-24 – 2019-03-25 (×3): via INTRAVENOUS

## 2019-03-24 NOTE — Progress Notes (Signed)
PROGRESS NOTE    [name]  FIE:[id number] DOB: 10/14/1940 DOA: 03/22/2019 PCP: Lucianne Lei, MD   Brief Narrative:  Peggy Welch is Peggy Welch 78 y.o. 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  She has also had some shortness of breath.  Patient has Peggy Welch known history of right-sided breast cancer with involvement of the lung and bones.  Previously seen by oncology Dr. Burr Medico, but has not followed up in over Peggy Welch year.  Per daughter, patient expressed wishes to avoid further treatment regarding her cancer.  Patient otherwise denies any recent subjective fevers, diaphoresis, chest pain, abdominal pain, diarrhea, constipation, or dysuria.  ED Course:  Initial vitals showed BP 135/117, pulse 95, RR 16, temp 99.2 Fahrenheit, SPO2 97% on room air.  Labs are notable for potassium 2.5, magnesium 2.0, sodium 134, chloride 89, bicarb 33, BUN 5, creatinine 0.64, calcium 13.6, albumin 4.1, phosphorus 2.0.  WBC 5.8, hemoglobin 13.5, platelets 357,000.  SARS-CoV-2 test was obtained and pending.  Portable chest x-ray showed moderate bilateral pleural effusions with basilar atelectasis.  CTA chest PE study showed Galya Dunnigan lobar/segmental PE within Torunn Chancellor branch of the right lower lobe pulmonary artery with overall small clot burden or evidence of right heart strain.  Mild interstitial edema and moderate to large bilateral pleural effusions are noted with dependent atelectasis in bilateral lower lobes.  Abnormal soft tissue throughout the right breast extending to the right axilla and involving the right pectoralis musculature was noted with appearance concerning for progression of infiltrating tumor when compared to prior.  Patient was given 1 L normal saline, 40 mEq of oral potassium, IV K 10 mEq x 4 runs, and started on IV  heparin drip.  The hospitalist service was consulted to admit for further evaluation and management.  Assessment & Plan:   Principal Problem:   Hypercalcemia of malignancy Active Problems:   Hypokalemia   Breast cancer of lower-inner quadrant of right female breast ([place])   Pulmonary embolism ([place])   Dysphagia   Peggy Welch is Peggy Welch 78 y.o. 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  Hypercalcemia: Moderate hypercalcemia 13.6 on admission likely secondary to malignancy in addition to HCTZ use   Improved since admission Will give dose of lasix x1 imaging findings concerning for interstitial edema Appreciate oncology who has recommended adding zometa Continue maintenance fluids overnight, monitor strict I/O's Hold home HCTZ  Hypokalemia/hypophosphatemia: Replace and follow  Lobar/segmental PE within branch of the right lower lobe pulmonary artery: Likely due to malignancy.  Has been started on IV heparin.   Follow LE [place] bilaterally Oncology recommending lovenox vs xarelto at d/c - will ask CM to help look into cost  Right breast cancer with metastases to lung and bones and RUE lymphedema: CT scan shows changes concerning for progression of infiltrating tumor.  Patient was previously followed by oncology, Dr. Burr Medico, but has not followed up in over [name] year.  Per discussion with daughter, patient has expressed to her that she did not want continued therapy for cancer and that is why she stopped following with oncology. - appreciate oncology recommendations, will consult palliative care - appreciate recs  Bilateral pleural effusions: Suspect due to malignancy.  She is currently saturating well on  room air does not appear to have respiratory distress.   S/p R thoracentesis on 7/30 - follow pending labs Consider L thora on 7/31  Dysphagia   Poor  appetite: Reported recent difficulty with swallowing and coughing.  Possibly related to tumor progression.  Have ordered SLP/swallow evaluation. Recommending dysphagia 2, thin liquid Pt c/o poor appetite and asking for something for appetite - start remeron  Hypertension: Hypertensive on admission.  Holding home HCTZ as above.  Use IV hydralazine as needed.  Asthma: No active wheezing.  Do not think presenting symptoms are related to asthma exacerbation.  Can continue albuterol as needed.  DVT prophylaxis: heparin Code Status: DNR Family Communication: daughter, Marcie Bal (requests I call sister tomorrow) Disposition Plan: pt requires further workup and treatment including thoracentesis by IR, continue treatment of hypercalcemia with IVF, zometa, IV heparin for PE with need for thoracentesis, and awaiting palliative care c/s as well as oncology s/o.  Continues to require inpatient management.   Consultants:   Oncology  Palliative care   Procedures:   none  Antimicrobials:  Anti-infectives (From admission, onward)   None     Subjective: Seems Peggy Welch little down today Frustrated she can't see family  Objective: Vitals:   03/24/19 0423 03/24/19 1350 03/24/19 1439 03/24/19 1459  BP: 117/78 125/79 124/69 127/69  Pulse: 94 89    Resp: 18 18    Temp: 99 F (37.2 C) 98.9 F (37.2 C)    TempSrc: Oral Oral    SpO2: 93% 94%    Weight: 69.4 kg     Height:        Intake/Output Summary (Last 24 hours) at 03/24/2019 1612 Last data filed at 03/24/2019 0600 Gross per 24 hour  Intake 1186 ml  Output 700 ml  Net 486 ml   Filed Weights   03/22/19 2344 03/23/19 0621 03/24/19 0423  Weight: 68.9 kg 69.8 kg 69.4 kg    Examination:  General: No acute distress. Cardiovascular: Heart sounds show Annai Heick regular rate, and rhythm Lungs: Clear to auscultation bilaterally  Abdomen: Soft, nontender, nondistended Neurological: Alert and oriented 3. Moves all extremities 4 . Cranial nerves II  through XII grossly intact. Skin: Warm and dry. No rashes or lesions. Extremities: No clubbing or cyanosis. No edema.   Data Reviewed: I have personally reviewed following labs and imaging studies  CBC: Recent Labs  Lab 03/22/19 1831 03/23/19 0755 03/24/19 0432  WBC 5.8 6.2 5.6  NEUTROABS 3.3  --   --   HGB 13.5 12.3 11.3*  HCT 42.3 38.4 36.7  MCV 94.6 96.7 97.9  PLT 357 343 097   Basic Metabolic Panel: Recent Labs  Lab 03/22/19 1831 03/23/19 0755 03/24/19 0432  NA 134* 138 138  K 2.5* 3.0* 3.0*  CL 89* 100 101  CO2 33* 28 29  GLUCOSE 101* 87 90  BUN 5* 5* 7*  CREATININE 0.64 0.55 0.60  CALCIUM 13.6* 11.6* 11.8*  MG 2.0  --  1.7  PHOS 2.0* 2.5 2.2*   GFR: Estimated Creatinine Clearance: 54.6 mL/min (by C-G formula based on SCr of 0.6 mg/dL). Liver Function Tests: Recent Labs  Lab 03/22/19 1831 03/23/19 0755 03/24/19 0432  AST 59*  --  39  ALT 30  --  21  ALKPHOS 71  --  48  BILITOT 1.3*  --  1.1  PROT 7.9  --  6.4*  ALBUMIN 4.1 3.5 3.2*   No results for input(s): LIPASE, AMYLASE in the last 168 hours. No results for  input(s): AMMONIA in the last 168 hours. Coagulation Profile: Recent Labs  Lab 03/22/19 2114  INR 1.1   Cardiac Enzymes: No results for input(s): CKTOTAL, CKMB, CKMBINDEX, TROPONINI in the last 168 hours. BNP (last 3 results) No results for input(s): PROBNP in the last 8760 hours. HbA1C: No results for input(s): HGBA1C in the last 72 hours. CBG: No results for input(s): GLUCAP in the last 168 hours. Lipid Profile: No results for input(s): CHOL, HDL, LDLCALC, TRIG, CHOLHDL, LDLDIRECT in the last 72 hours. Thyroid Function Tests: No results for input(s): TSH, T4TOTAL, FREET4, T3FREE, THYROIDAB in the last 72 hours. Anemia Panel: No results for input(s): VITAMINB12, FOLATE, FERRITIN, TIBC, IRON, RETICCTPCT in the last 72 hours. Sepsis Labs: No results for input(s): PROCALCITON, LATICACIDVEN in the last 168 hours.  Recent Results  (from the past 240 hour(s))  SARS Coronavirus 2 (CEPHEID - Performed in [place] hospital lab), Hosp Order     Status: None   Collection Time: 03/22/19  5:57 PM   Specimen: Nasopharyngeal Swab  Result Value Ref Range Status   SARS Coronavirus 2 NEGATIVE NEGATIVE Final    Comment: (NOTE) If result is NEGATIVE SARS-CoV-2 target nucleic acids are NOT DETECTED. The SARS-CoV-2 RNA is generally detectable in upper and lower  respiratory specimens during the acute phase of infection. The lowest  concentration of SARS-CoV-2 viral copies this assay can detect is 250  copies / mL. Stepehn Eckard negative result does not preclude SARS-CoV-2 infection  and should not be used as the sole basis for treatment or other  patient management decisions.  Perez Dirico negative result may occur with  improper specimen collection / handling, submission of specimen other  than nasopharyngeal swab, presence of viral mutation(s) within the  areas targeted by this assay, and inadequate number of viral copies  (<250 copies / mL). Wm Sahagun negative result must be combined with clinical  observations, patient history, and epidemiological information. If result is POSITIVE SARS-CoV-2 target nucleic acids are DETECTED. The SARS-CoV-2 RNA is generally detectable in upper and lower  respiratory specimens dur ing the acute phase of infection.  Positive  results are indicative of active infection with SARS-CoV-2.  Clinical  correlation with patient history and other diagnostic information is  necessary to determine patient infection status.  Positive results do  not rule out bacterial infection or co-infection with other viruses. If result is PRESUMPTIVE POSTIVE SARS-CoV-2 nucleic acids MAY BE PRESENT.   Romeka Scifres presumptive positive result was obtained on the submitted specimen  and confirmed on repeat testing.  While 2019 novel coronavirus  (SARS-CoV-2) nucleic acids may be present in the submitted sample  additional confirmatory testing may be  necessary for epidemiological  and / or clinical management purposes  to differentiate between  SARS-CoV-2 and other Sarbecovirus currently known to infect humans.  If clinically indicated additional testing with an alternate test  methodology [id number]) is advised. The SARS-CoV-2 RNA is generally  detectable in upper and lower respiratory sp ecimens during the acute  phase of infection. The expected result is Negative. Fact Sheet for Patients:  StrictlyIdeas.no Fact Sheet for Healthcare Providers: BankingDealers.co.za This test is not yet approved or cleared by the [place] FDA and has been authorized for detection and/or diagnosis of SARS-CoV-2 by FDA under an Emergency Use Authorization (EUA).  This EUA will remain in effect (meaning this test can be used) for the duration of the COVID-19 declaration under Section 564(b)(1) of the Act, 21 U.S.C. section 360bbb-3(b)(1), unless the authorization is terminated or  revoked sooner. Performed at [facility], [place] [address] [place], [place] [address]          Radiology Studies: Dg Chest 1 View  Result Date: 03/24/2019 CLINICAL DATA:  Status post right thoracentesis EXAM: CHEST  1 VIEW COMPARISON:  March 22, 2019 FINDINGS: There is interval resolution of right-sided pleural effusion. No pneumothorax. Streaky atelectasis seen at the right lung base. Again noted is Rushie Brazel moderate left pleural effusion. The cardiomediastinal silhouette is unchanged with aortic knob calcifications. Degenerative changes seen at both shoulders. IMPRESSION: 1. No right-sided pneumothorax 2. Moderate left pleural effusion. Electronically Signed   By: Prudencio Pair M.D.   On: 03/24/2019 15:29   Dg Chest 2 View  Result Date: 03/22/2019 CLINICAL DATA:  78 y.o female sent over from PCP for difficulty swallowing, productive coughing, and low 02. 95% RA in triage. Denies chest pain or shob. Hx. Asthma,  HTN, PNA, and rt breast cancer EXAM: CHEST - 2 VIEW COMPARISON:  Chest CT, 12/14/2017. Chest radiographs, 11/30/2014 and earlier. FINDINGS: Moderate bilateral pleural effusions obscure the hemidiaphragms and most of the heart borders. There is additional lung base opacity consistent with atelectasis. Remainder of the lungs is clear. No pneumothorax. Cardiac silhouette is top-normal in size. No mediastinal or hilar masses. Skeletal structures are intact. IMPRESSION: 1. Moderate bilateral pleural effusions with associated basilar atelectasis. Cannot exclude underlying pneumonia. No evidence of pulmonary edema. Electronically Signed   By: Lajean Manes M.D.   On: 03/22/2019 17:42   Ct Angio Chest Pe W And/or Wo Contrast  Result Date: 03/22/2019 CLINICAL DATA:  Difficulty swallowing, productive cough, hypoxia. History of breast cancer. EXAM: CT ANGIOGRAPHY CHEST WITH CONTRAST TECHNIQUE: Multidetector CT imaging of the chest was performed using the standard protocol during bolus administration of intravenous contrast. Multiplanar CT image reconstructions and MIPs were obtained to evaluate the vascular anatomy. CONTRAST:  119mL OMNIPAQUE IOHEXOL 350 MG/ML SOLN COMPARISON:  Chest radiographs dated 03/22/2019. CT chest dated 12/14/2017. FINDINGS: Cardiovascular: Satisfactory opacification of the bilateral pulmonary arteries to the lobar level. Lobar/segmental pulmonary embolism within Nolin Grell branch of the right lower lobe pulmonary artery (series 8/image 117). Overall clot burden is small. No evidence of right heart strain. No evidence of thoracic aortic aneurysm or dissection. Mild atherosclerotic calcifications of the aortic arch. The heart is normal in size.  Trace pericardial fluid. Mediastinum/Nodes: No suspicious mediastinal lymphadenopathy. Visualized thyroid is unremarkable. Lungs/Pleura: Evaluation of the lung parenchyma is constrained by respiratory motion. Within that constraint, there are no suspicious pulmonary  nodules. Suspected mild perihilar edema in the bilateral upper lobes. Moderate to large bilateral pleural effusions. Associated areas of scattered atelectasis in the bilateral upper lobes with dependent atelectasis in the bilateral lower lobes. No pneumothorax. Upper Abdomen: Visualized upper abdomen is notable for prior cholecystectomy. Musculoskeletal: Abnormal soft tissue throughout the right breast and extending to the right axilla with involvement of the right pectoralis musculature (series 7/image 27), measuring approximately 4.5 x 14.5 cm in axial dimension. While some of this appearance may reflect radiation change, the overall appearance is considered concerning for infiltrating tumor which has progressed from the prior. Overlying skin thickening. Degenerative changes of the visualized thoracolumbar spine. Review of the MIP images confirms the above findings. IMPRESSION: Lobar/segmental pulmonary embolism within Kennita Pavlovich branch of the right lower lobe pulmonary artery. Overall clot burden is small. No evidence of right heart strain. Mild interstitial edema. Moderate to large bilateral pleural effusions. Dependent atelectasis in the bilateral lower lobes. Abnormal soft tissue throughout  the right breast, extending to the right axilla, and involving the right pectoralis musculature. This appearance is concerning for infiltrating tumor which has progressed from the prior. Overlying skin thickening. These results were called by telephone at the time of interpretation on 03/22/2019 at 8:48 pm to Dr. Gareth Morgan, who verbally acknowledged these results. Aortic Atherosclerosis (ICD10-I70.0). Electronically Signed   By: Julian Hy M.D.   On: 03/22/2019 20:49   Dg Swallowing Func-speech Pathology  Result Date: 03/23/2019 Objective Swallowing Evaluation: Type of Study: MBS-Modified Barium Swallow Study  Patient Details Name: JAZMEN LINDENBAUM MRN: [id number] Date of Birth: [birth year] Today's Date: 03/23/2019 Time:  SLP Start Time (ACUTE ONLY): 0810 -SLP Stop Time (ACUTE ONLY): 0840 SLP Time Calculation (min) (ACUTE ONLY): 30 min Past Medical History: Past Medical History: Diagnosis Date  Arthritis   Asthma   Asthma exacerbation 10/14/2011  Blood transfusion without reported diagnosis   Hepatitis C   from blood transfusion  Hypertension   Pneumonia 4/02016  rt breast ca dx'd 2019 Past Surgical History: Past Surgical History: Procedure Laterality Date  CHOLECYSTECTOMY  02/21/2002  JOINT REPLACEMENT    Bilateral total knee replacements  knee replacements Bilateral   Right Rotator Cuff Repair  08/12/2006  TOTAL HIP ARTHROPLASTY Right 11/22/2014  dr Lorin Mercy  TOTAL HIP ARTHROPLASTY Right 11/22/2014  Procedure: TOTAL HIP ARTHROPLASTY ANTERIOR APPROACH;  Surgeon: Marybelle Killings, MD;  Location: [facility];  Service: Orthopedics;  Laterality: Right; HPI: pt is Peggy Welch 78 yo female with h/o breast cancer admitted to [facility] with dysphagia.  Pt found to have hypoxia also.  MBS ordered.  Pt reports dysphagia clinically with symptoms including coughing on water immediately after swallowing.  Pt CT chest showed "Abnormal soft tissue throughout the right breast and extending to the right axilla with involvement of the right pectoralis musculature (series 7/image 27), measuring approximately 4.5 x 14.5 cm in axial dimension. While some of this appearance may reflect radiation change, the overall appearance is considered concerning for infiltrating tumor which has progressed from prior".  Pt is also positive for pulmonary embolism.  She also has bilateral pleural effusions.  Per MD notes, pt has not followed oncology for one year and does not want further treatment.  Subjective: pt awake in chair, very HOH Assessment / Plan / Recommendation CHL IP CLINICAL IMPRESSIONS 03/23/2019 Clinical Impression Patient presents with mild pharyngeal dysphagia with motor impairments but no aspiration.  Pt's dysphagia marked by decreased timing of laryngeal closure  which allows consistent laryngeal penetration of thin liquids and pharyngeal retention clearance.  She is however protective of her airway and independently conducts dry swallows with breath hold to clear larynx/pharynx of liquid residual.  Indequate epiglottic deflection negatively impacts solids/puree pharyngeal clearance although pt is sensate.  Reflexive dry swallow decreased solid/puree residuals and liquid "wash" helpful to clear pharynx.  Postures attempted (head turn right and chin tuck) although neck ROM very limited thus not adequately performed nor were they helpful to prevent residuals or decrease amount of laryngeal penetration.  Pt with increased aspiration risk with liquid using chin tuck as it contributes to spillage of residuals from pyriform sinus into larynx.  Pt is compensating well for her dysphagia without airway infiltration.  Recommend pt maximize her liquid nutritional intake as liquids clear pharynx easier than solids.  Pt reports she has been drinking liquid supplements at home.  Recommend dys3/thin diet with strict aspiration precautions.  Given pt reports xerostomia, recommend start all po with liquids.  Question if pt's tumor  progression may be impacting her pharyngeal and esophageal swallowing ability.  Will follow up for pt education to compensation strategies.  Note per review of chart, she did not want further treatment of her breast cancer thus compensation for dysphagia is likely optimal route for her goals.Of note, pt did cough x1 during MBS but there was not barium visualized in her trachea. SLP Visit Diagnosis Dysphagia, pharyngoesophageal phase (R13.14) Attention and concentration deficit following -- Frontal lobe and executive function deficit following -- Impact on safety and function Mild aspiration risk   CHL IP TREATMENT RECOMMENDATION 12/04/2014 Treatment Recommendations No treatment recommended at this time   No flowsheet data found. CHL IP DIET RECOMMENDATION 03/23/2019  SLP Diet Recommendations Dysphagia 3 (Mech soft) solids;Thin liquid Liquid Administration via Cup;Straw Medication Administration Crushed with puree Compensations Slow rate;Small sips/bites;Multiple dry swallows after each bite/sip;Follow solids with liquid Postural Changes Remain semi-upright after after feeds/meals (Comment);Seated upright at 90 degrees   CHL IP OTHER RECOMMENDATIONS 03/23/2019 Recommended Consults -- Oral Care Recommendations Oral care BID Other Recommendations --   CHL IP FOLLOW UP RECOMMENDATIONS 12/04/2014 Follow up Recommendations None   CHL IP FREQUENCY AND DURATION 03/23/2019 Speech Therapy Frequency (ACUTE ONLY) min 1 x/week Treatment Duration 1 week      CHL IP ORAL PHASE 03/23/2019 Oral Phase Impaired Oral - Pudding Teaspoon -- Oral - Pudding Cup -- Oral - Honey Teaspoon -- Oral - Honey Cup -- Oral - Nectar Teaspoon -- Oral - Nectar Cup WFL;Piecemeal swallowing Oral - Nectar Straw -- Oral - Thin Teaspoon WFL;Piecemeal swallowing Oral - Thin Cup [facility];Piecemeal swallowing Oral - Thin Straw Piecemeal swallowing;WFL Oral - Puree Lingual pumping Oral - Mech Soft WFL Oral - Regular -- Oral - Multi-Consistency -- Oral - Pill -- Oral Phase - Comment --  CHL IP PHARYNGEAL PHASE 03/23/2019 Pharyngeal Phase -- Pharyngeal- Pudding Teaspoon -- Pharyngeal -- Pharyngeal- Pudding Cup -- Pharyngeal -- Pharyngeal- Honey Teaspoon -- Pharyngeal -- Pharyngeal- Honey Cup -- Pharyngeal -- Pharyngeal- Nectar Teaspoon -- Pharyngeal -- Pharyngeal- Nectar Cup -- Pharyngeal -- Pharyngeal- Nectar Straw -- Pharyngeal -- Pharyngeal- Thin Teaspoon WFL Pharyngeal Material does not enter airway Pharyngeal- Thin Cup Reduced airway/laryngeal closure;Penetration/Aspiration during swallow;Penetration/Aspiration before swallow;Pharyngeal residue - pyriform;Pharyngeal residue - valleculae Pharyngeal Material enters airway, remains ABOVE vocal cords then ejected out Pharyngeal- Thin Straw Reduced airway/laryngeal closure;Pharyngeal  residue - valleculae;Pharyngeal residue - pyriform Pharyngeal Material enters airway, remains ABOVE vocal cords then ejected out Pharyngeal- Puree Reduced epiglottic inversion;Reduced tongue base retraction;Pharyngeal residue - valleculae;Pharyngeal residue - pyriform Pharyngeal Material does not enter airway Pharyngeal- Mechanical Soft Reduced tongue base retraction;Reduced epiglottic inversion;Pharyngeal residue - valleculae;Pharyngeal residue - pyriform Pharyngeal Material does not enter airway Pharyngeal- Regular -- Pharyngeal -- Pharyngeal- Multi-consistency -- Pharyngeal -- Pharyngeal- Pill -- Pharyngeal -- Pharyngeal Comment pt with minimal neck ROM and thus chin tuck nor head turn right were nof performed adequately and they were not helpful in preventing laryngeal penetration or decreasing pharyngeal stasis; reflexive dry swallows with breath hold with liquids protective of her airway and liquid swallow post-swallow of food decreased pharyngeal residuals  CHL IP CERVICAL ESOPHAGEAL PHASE 03/23/2019 Cervical Esophageal Phase Impaired Pudding Teaspoon -- Pudding Cup -- Honey Teaspoon -- Honey Cup -- Nectar Teaspoon -- Nectar Cup -- Nectar Straw -- Thin Teaspoon -- Thin Cup -- Thin Straw -- Puree -- Mechanical Soft -- Regular -- Multi-consistency -- Pill -- Cervical Esophageal Comment With puree, pt appeared with halting below CP but this did clear with liquids.  Upon esophageal sweep,  pt appeared with minimal esophagus - suspect WFL.  Radiologist not present to confirm findings. Luanna Salk, MS [facility] SLP Acute Rehab Services Pager [phone] Office [phone] Macario Golds 03/23/2019, 11:08 AM              US Thoracentesis Asp Pleural Space W/img Guide  Result Date: 03/24/2019 INDICATION: Patient with history of metastatic breast cancer, dyspnea, and bilateral pleural effusions. Request is made for diagnostic and therapeutic right thoracentesis. EXAM: ULTRASOUND GUIDED DIAGNOSTIC AND THERAPEUTIC  RIGHT THORACENTESIS MEDICATIONS: 10 mL 1% lidocaine COMPLICATIONS: None immediate. PROCEDURE: An ultrasound guided thoracentesis was thoroughly discussed with the patient and questions answered. The benefits, risks, alternatives and complications were also discussed. The patient understands and wishes to proceed with the procedure. Written consent was obtained. Ultrasound was performed to localize and mark an adequate pocket of fluid in the right chest. The area was then prepped and draped in the normal sterile fashion. 1% Lidocaine was used for local anesthesia. Under ultrasound guidance Imelda Dandridge 6 Fr Safe-T-Centesis catheter was introduced. Thoracentesis was performed. The catheter was removed and Geriann Lafont dressing applied. FINDINGS: Chanti Golubski total of approximately 850 mL of hazy gold fluid was removed. Samples were sent to the laboratory as requested by the clinical team. IMPRESSION: Successful ultrasound guided right thoracentesis yielding 850 mL of pleural fluid. Read by: Earley Abide, PA-C No pneumothorax on follow-up radiograph. Electronically Signed   By: Lucrezia Europe M.D.   On: 03/24/2019 15:34        Scheduled Meds:  [START ON 03/25/2019] feeding supplement  1 Container Oral Q24H   feeding supplement (ENSURE ENLIVE)  237 mL Oral BID BM   guaiFENesin  600 mg Oral BID   lidocaine       mirtazapine  7.5 mg Oral QHS   multivitamin with minerals  1 tablet Oral Daily   sodium chloride flush  3 mL Intravenous Q12H   Continuous Infusions:  0.9 % NaCl with KCl 20 mEq / L Stopped (03/24/19 1302)   heparin 1,000 Units/hr (03/24/19 0600)     LOS: 2 days    Time spent: over 30 min    Fayrene Helper, MD [facility] Pager AMION  If 7PM-7AM, please contact night-coverage [email or web address] Password [email or web address] 03/24/2019, 4:12 PM

## 2019-03-24 NOTE — Consult Note (Signed)
Consultation Note Date: 03/24/2019   Patient Name: Peggy Welch  DOB: Jun 14, 1941  MRN: [id number]  Age / Sex: 78 y.o., female   PCP: Lucianne Lei, MD Referring Physician: Elodia Florence., *   REASON FOR CONSULTATION:Establishing goals of care  Palliative Care consult requested for this 78 y.o. 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 She presented to ED from home with shortness of breath, cough, and swallowing. Patient was being treated by Dr. Burr Medico (Oncology) previously, however she has not followed up in over a year. chest x-ray showed moderate bilateral pleural effusions with basilar atelectasis. CT chest showed a lobar segmental PE within a branch of the right lower lobe pulmonary artery with overall small clot burden or evidence of right heart strain. Abnormal soft tissue throughout the right breast extending to the right axilla and involving the right pectoralis musculature was noted with appearance concerning for progression of infiltrating tumor. Calcium 13.6. Patient given Zometa per Oncology recommendations. Also on IV heparin. Palliative Medicine consulted for goals of care.   Clinical Assessment and Goals of Care: I have reviewed medical records including lab results, imaging, Epic notes, and MAR, received report from the bedside RN, and assessed the patient. I met at the bedside with patient and spoke with daughter Marcie Bal) and sister Joycelyn Schmid) via phone to discuss diagnosis prognosis, [place], EOL wishes, disposition and options. Patient is awake, alert, and oriented x3. She denies pain. She is s/p thoracentesis today (850 ml) and reports decreased shortness of breath and feeling better.   I introduced Palliative Medicine as specialized medical care for people living with serious illness. It focuses on providing  relief from the symptoms and stress of a serious illness. The goal is to improve quality of life for both the patient and the family.  We discussed a brief life review of the patient, along with functional and nutritional status.  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 She had 7 children (4 surviving). She lives with her son Elenore Rota and her granddaughter. She is close to her sister Joycelyn Schmid. She states they are the best of friends and sisters. She is a woman of [place].   Prior to admission patient reports being able to perform all ADLs independently. She denied use of assistive devices. She states she has noticed increased fatigue and weakness over the past month. Her appetite has not been the best over the past 3 months. She endorses some weight loss but unsure how much and does not feel it is significant maybe 5 lb. She states she does not have much of an appetite on most days but eats because she knows she has to. Also states throat soreness, feeling of food getting stuck, and coughing when eating certain foods.   We discussed Her current illness and what it means in the larger context of Her on-going co-morbidities. With specific discussions regarding her PE, metastatic breast cancer, bilateral pleural effusions, and her overall nutritional decline/dysphagia. Natural disease trajectory and expectations at EOL were discussed. Patient verbalized understanding of her current illness and co-morbidities. She was able to summarize her current care plan and findings   When discussing her metastatic cancer, patient states "I knew I had cancer. I was not doing chemo. 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 I went to the doctor  like I was supposed to and had my exams. Every year I was fine, and then one day I feel a lump and I have cancer." Support given. Patient expressed her frustration with her diagnosis initially often questioning why her and how knowing she  great care of herself, having all annual follow-ups. She shares she began praying and asking God to shrink the tumor area and heal her or have his way with her life. She states "I knew how I wanted my quality of life to be and it wasn't going to be going back and forth to no chemo to treat something to be told it wasn't working or to feel horrible!" She states that is how she felt and why she chose not to continue follow-ups. She is thankful for life and shares how God continues to spare her.   She states she is not interested in any work-up or aggressive care in relation to her cancer. She verbalized awareness that it has metastasize and states "when it is my time, it's my time. I'm ready and my family knows how I feel and support me!" Speaking with daughter and sister they also confirm awareness of patient's wishes not to follow up on her cancer and to live life how she wanted. They support her decisions and express hope that she will get back home with family and friends and continue to enjoy what time she has left.   I attempted to elicit values and goals of care important to the patient.    The difference between aggressive medical intervention and comfort care was considered in light of the patient's goals of care. Patient and family are not interested in comfort care. Patient states she does not want to suffer and does not feel that she is at this point. She is thankful for medical intervention and beginning to feel somewhat better. She knows her health will continue to decline.   We discussed trajectory of cancer progression and possibility of increased symptom burden. Patient and family verbalized understanding.   Patient states she does not have a documented advance directive, however her family is aware of her wishes. Her daughter Marcie Bal and grand daughter Jane Canary and sister, Joycelyn Schmid are her medical decision makers. She states her sons are sensitive and would not be able to make decisions.    She confirms DNR/DNI. No artificial feeding (Peg).   Patient shares that her granddaughter lives in [place] and is coming into town next week. She is a Therapist, sports.   Hospice and Palliative Care services outpatient were explained and offered. Patient and family verbalized their understanding and awareness of both palliative and hospice's goals and philosophy of care. Recommended outpatient hospice given metastatic disease, symptoms, and expressed wishes not to suffer, to be comfortable amongst family and friends, with no cancer work up or interventions.   Patient states she did not want hospice support at home as she feels her family will all care for her and provide support. Again discussed with patient and her family how hospice can be an additional resource and support to patient and family, and most importantly assist with symptom management and minimize hospitalizations. Patient's daughter and sister express their wishes for hospice involvement. They would like to continue discussion with family and gain her acceptance. Daughter and sister states awareness if patient does go home without services initiated they know they can initiate at anytime by contacting hospice and gaining support of her outpatient medical team. Advised family at minimum if patient is not in  agreement with hospice discharging with palliative and allowing them to assist with transitioning to hospice.   Questions and concerns were addressed. The family was encouraged to call with questions or concerns.  PMT will continue to support holistically.   SOCIAL HISTORY:     reports that she has never smoked. She has never used smokeless tobacco. She reports that she does not drink alcohol or use drugs.  CODE STATUS: DNR  ADVANCE DIRECTIVES: Next of Kin    SYMPTOM MANAGEMENT: per attending   Palliative Prophylaxis:   Aspiration, Bowel Regimen, Delirium Protocol and Frequent Pain Assessment  PSYCHO-SOCIAL/SPIRITUAL:  Support  System: Family   Desire for further Chaplaincy support: NO  Additional Recommendations (Limitations, Scope, Preferences):  Avoid Hospitalization, No Artificial Feeding, No Chemotherapy, No Radiation, No Surgical Procedures and continue with current treatment, no escalation of care.    PAST MEDICAL HISTORY: Past Medical History:  Diagnosis Date  . Arthritis   . Asthma   . Asthma exacerbation 10/14/2011  . Blood transfusion without reported diagnosis   . Hepatitis C    from blood transfusion  . Hypertension   . Pneumonia 4/02016  . rt breast ca dx'd 2019    PAST SURGICAL HISTORY:  Past Surgical History:  Procedure Laterality Date  . CHOLECYSTECTOMY  02/21/2002  . JOINT REPLACEMENT     Bilateral total knee replacements  . knee replacements Bilateral   . Right Rotator Cuff Repair  08/12/2006  . TOTAL HIP ARTHROPLASTY Right 11/22/2014   dr Lorin Mercy  . TOTAL HIP ARTHROPLASTY Right 11/22/2014   Procedure: TOTAL HIP ARTHROPLASTY ANTERIOR APPROACH;  Surgeon: Marybelle Killings, MD;  Location: [facility];  Service: Orthopedics;  Laterality: Right;    ALLERGIES:  is allergic to shrimp [shellfish allergy].   MEDICATIONS:  Current Facility-Administered Medications  Medication Dose Route Frequency Provider Last Rate Last Dose  . 0.9 %  sodium chloride infusion   Intravenous Continuous Elodia Florence., MD 100 mL/hr at 03/24/19 1638    . acetaminophen (TYLENOL) tablet 650 mg  650 mg Oral Q6H PRN Lenore Cordia, MD       Or  . acetaminophen (TYLENOL) suppository 650 mg  650 mg Rectal Q6H PRN Zada Finders R, MD      . albuterol (PROVENTIL) (2.5 MG/3ML) 0.083% nebulizer solution 2.5 mg  2.5 mg Nebulization Q4H PRN Zada Finders R, MD   2.5 mg at 03/24/19 1302  . diphenhydrAMINE (BENADRYL) capsule 25 mg  25 mg Oral QHS PRN Schorr, Rhetta Mura, NP   25 mg at 03/23/19 2108  . [START ON 03/25/2019] feeding supplement (BOOST / RESOURCE BREEZE) liquid 1 Container  1 Container Oral Q24H Elodia Florence., MD      . feeding supplement (ENSURE ENLIVE) (ENSURE ENLIVE) liquid 237 mL  237 mL Oral BID BM Elodia Florence., MD      . guaiFENesin [facility]) 12 hr tablet 600 mg  600 mg Oral BID Elodia Florence., MD   600 mg at 03/24/19 1657  . heparin ADULT infusion 100 units/mL (25000 units/272m sodium chloride 0.45%)  1,000 Units/hr Intravenous Continuous PZada FindersR, MD 10 mL/hr at 03/24/19 0600 1,000 Units/hr at 03/24/19 0600  . lidocaine (XYLOCAINE) 1 % (with pres) injection           . mirtazapine (REMERON) tablet 7.5 mg  7.5 mg Oral QHS PElodia Florence, MD   7.5 mg at 03/23/19 2104  . multivitamin with minerals tablet 1  tablet  1 tablet Oral Daily Elodia Florence., MD   1 tablet at 03/24/19 1655  . ondansetron (ZOFRAN) tablet 4 mg  4 mg Oral Q6H PRN Lenore Cordia, MD       Or  . ondansetron (ZOFRAN) injection 4 mg  4 mg Intravenous Q6H PRN Zada Finders R, MD      . phosphorus (K PHOS NEUTRAL) tablet 500 mg  500 mg Oral TID Elodia Florence., MD   500 mg at 03/24/19 1655  . senna-docusate (Senokot-S) tablet 1 tablet  1 tablet Oral QHS PRN Zada Finders R, MD      . sodium chloride flush (NS) 0.9 % injection 3 mL  3 mL Intravenous Q12H Zada Finders R, MD   3 mL at 03/23/19 1004    VITAL SIGNS: BP 127/69 (BP Location: Left Arm)   Pulse 89   Temp 98.9 F (37.2 C) (Oral)   Resp 18   Ht '5\' 4"'$  (1.626 m)   Wt 69.4 kg   SpO2 94%   BMI 26.28 kg/m  Filed Weights   03/22/19 2344 03/23/19 0621 03/24/19 0423  Weight: 68.9 kg 69.8 kg 69.4 kg    Estimated body mass index is 26.28 kg/m as calculated from the following:   Height as of this encounter: '5\' 4"'$  (1.626 m).   Weight as of this encounter: 69.4 kg.  LABS: CBC:    Component Value Date/Time   WBC 5.6 03/24/2019 0432   HGB 11.3 (L) 03/24/2019 0432   HGB 11.3 (L) 04/13/2018 1415   HGB 12.2 03/27/2015 1144   HCT 36.7 03/24/2019 0432   HCT 37.1 03/27/2015 1144   PLT 337 03/24/2019 0432    PLT 250 04/13/2018 1415   PLT 305 03/27/2015 1144   Comprehensive Metabolic Panel:    Component Value Date/Time   NA 138 03/24/2019 0432   NA 141 03/27/2015 1145   K 3.0 (L) 03/24/2019 0432   K 4.0 03/27/2015 1145   CO2 29 03/24/2019 0432   CO2 30 (H) 03/27/2015 1145   BUN 7 (L) 03/24/2019 0432   BUN 14.0 03/27/2015 1145   CREATININE 0.60 03/24/2019 0432   CREATININE 0.76 04/13/2018 1415   CREATININE 0.7 03/27/2015 1145   ALBUMIN 3.2 (L) 03/24/2019 0432   ALBUMIN 3.7 03/27/2015 1145     Review of Systems  Constitutional: Positive for fatigue.  Respiratory: Positive for cough.   Neurological: Positive for weakness.   Physical Exam General: NAD, frail chronically-ill appearing, thin Cardiovascular: regular rate and rhythm Pulmonary: clear ant fields, diminished bases Abdomen: soft, nontender, + bowel sounds Extremities: right arm lyphedema, no joint deformities Skin: no rashes, right breast tumor noted with indention, firmness  Neurological: Weakness, awake, A&O x3   Prognosis: < 6 months in the setting of metastatic breast cancer to bone and lung, bilateral pleural effusion s/p thoracentesis, RUE lymphedema, PE, hypercalcemia, dysphagia, poor po intake.   Discharge Planning:  Home with Hospice  Recommendations:  DNR/DNI-as confirmed by patient  Continue to current plan of care per medical team  Patient declines work-up or treatment for metastatic cancer. Goals is to stabilize and return home with family and enjoy what time she has left.   Patient would benefit for outpatient hospice. Daughter and sister agrees and request to continue discussion with patient. Recommend outpatient palliative at minimum for support and transition assistance to hospice. Discussed with Dr. Florene Glen.   PMT will continue to support and follow. I will be off service  after today. Please contact available provider via AMION or at [phone] for urgent needs.    Palliative Performance Scale:  PPS 30%               Patient, daughter, and sister expressed understanding and was in agreement with this plan.   Thank you for allowing the Palliative Medicine Team to assist in the care of this patient.  Time In: 1600 Time Out: 1720 Time Total: 80 min.   Visit consisted of counseling and education dealing with the complex and emotionally intense issues of symptom management and palliative care in the setting of serious and potentially life-threatening illness.Greater than 50%  of this time was spent counseling and coordinating care related to the above assessment and plan.  Signed by:  Alda Lea, AGPCNP-BC Palliative Medicine Team  Phone: [phone] Fax: [phone] Pager: [phone] Amion: Bjorn Pippin

## 2019-03-24 NOTE — Procedures (Signed)
PROCEDURE SUMMARY:  Successful image-guided right thoracentesis. Yielded 850 milliliters of hazy gold fluid. Patient tolerated procedure well. No immediate complications. EBL = 0 mL.  Specimen was sent for labs. CXR ordered.  Kaleiah Kutzer PA-C 03/24/2019 3:01 PM

## 2019-03-24 NOTE — Progress Notes (Signed)
Initial Nutrition Assessment  RD working remotely.   DOCUMENTATION CODES:   Not applicable  INTERVENTION:  - will order Boost Breeze once/day, each supplement provides 250 kcal and 9 grams of protein. - will order Ensure Enlive BID, each supplement provides 350 kcal and 20 grams of protein. - will order Magic Cup with dinner meals, each supplement provides 290 kcal and 9 grams of protein. - will order daily multivitamin with minerals. - continue to encourage PO intakes.    NUTRITION DIAGNOSIS:   Increased nutrient needs related to acute illness, chronic illness, cancer and cancer related treatments as evidenced by estimated needs.  GOAL:   Patient will meet greater than or equal to 90% of their needs  MONITOR:   PO intake, Supplement acceptance, Labs, Weight trends  REASON FOR ASSESSMENT:   Consult Assessment of nutrition requirement/status  ASSESSMENT:   78 y.o. 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 She presented to the ED for evaluation of several days of difficulty swallowing and a cough. She has also had some shortness of breath. She has not followed up with Oncology in over a year. Per daughter, patient expressed wishes to avoid further treatment regarding her cancer.  No intakes documented since admission. Per chart review, current weight is 153 lb and weight on 04/13/18 was 180 lb. This indicates 27 lb weight loss (15% body weight) in the past 11 months; not significant for time frame. Weight at [facility] on 09/24/18 was 170 lb. This indicates 17 lb weight loss (10% body weight) in the past 6 months which is borderline significant for time frame.   Patient has had a decreased appetite for unknown amount of time, but difficulty with swallowing has been a recent issue. She denies issues with chewing. No abdominal pain/pressure or nausea PTA.  Patient was seen by  SLP for MBS yesterday AM. Reviewed SLP notes from yesterday. Recommendation was originally made for Dysphagia 3, thin liquids but two hours later recommendation was changed to Dysphagia 2, thin liquids (current diet order).    Per notes: - hypercalcemia--13.6 on admission and now down to 11.8 s/p dose of zometa - concern for interstitial edema - hypokalemia with repletion ordered - RLL PE thought to be 2/2 malignancy--IV heparin - R breast cancer with mets to lung and bone and RUE lymphedema - bilateral pleural effusions - dysphagia and poor appetite - consult placed to Palliative Care    Labs reviewed; K: 3 mmol/l, BUN: 7 mg/dl, Ca: 11.8 mg/dl, Phos: 2.2 mg/dl. Medications reviewed; 20 mg IV lasix x1 dose 7/29, 40 mEq K-Dur x1 dose 7/29, 4 mg IV zometa x1 dose 7/29. IVF; NS-20 mEq IV KCl @ 100 ml/hr.      NUTRITION - FOCUSED PHYSICAL EXAM:  unable to complete at this time.   Diet Order:   Diet Order            DIET DYS 2 Room service appropriate? Yes with Assist; Fluid consistency: Thin  Diet effective now              EDUCATION NEEDS:   No education needs have been identified at this time  Skin:  Skin Assessment: Reviewed RN Assessment  Last BM:  7/28  Height:   Ht Readings from Last 1 Encounters:  03/22/19 5\' 4"  (1.626 m)    Weight:   Wt Readings from Last 1 Encounters:  03/24/19 69.4 kg  Ideal Body Weight:  54.5 kg  BMI:  Body mass index is 26.28 kg/m.  Estimated Nutritional Needs:   Kcal:  2100-2300 kcal  Protein:  105-120 grams  Fluid:  >/= 2.1 L/day      Jarome Matin, MS, RD, LDN, [facility] Inpatient Clinical Dietitian Pager # [phone] After hours/weekend pager # [phone]

## 2019-03-24 NOTE — Progress Notes (Signed)
ANTICOAGULATION CONSULT NOTE - Follow Up Consult  Pharmacy Consult for Heparin Indication: pulmonary embolus  Allergies  Allergen Reactions  . 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    Swelling in throat    Patient Measurements: Height: 5\' 4"  (162.6 cm) Weight: 153 lb 1.6 oz (69.4 kg) IBW/kg (Calculated) : 54.7 Heparin Dosing Weight: 68.8 kg  Vital Signs: Temp: 99 F (37.2 C) (07/30 0423) Temp Source: Oral (07/30 0423) BP: 117/78 (07/30 0423) Pulse Rate: 94 (07/30 0423)  Labs: Recent Labs    03/22/19 1831 03/22/19 2114 03/23/19 0755 03/23/19 1626 03/24/19 0432  HGB 13.5  --  12.3  --  11.3*  HCT 42.3  --  38.4  --  36.7  PLT 357  --  343  --  337  APTT  --  29  --   --   --   LABPROT  --  14.0  --   --   --   INR  --  1.1  --   --   --   HEPARINUNFRC  --   --  0.49 0.38 0.55  CREATININE 0.64  --  0.55  --  0.60    Estimated Creatinine Clearance: 54.6 mL/min (by C-G formula based on SCr of 0.6 mg/dL).   Assessment: 3 y/oF with new PE. Pharmacy consulted for IV heparin dosing. Patient not on any anticoagulants PTA. Baseline labs WNL.  Today, 03/24/19   Heparin level = 0.55 units/mL, remains therapeutic  Hgb decreased to 11.3, Pltc WNL/stable  No bleeding or infusion issues noted per nursing  Goal of Therapy:  Heparin level 0.3-0.7 units/ml Monitor platelets by anticoagulation protocol: Yes   Plan:  Continue heparin infusion at 1000 units/hr Daily CBC, heparin level Monitor closely for s/sx of bleeding F/u when heparin needs to be held for thoracentesis in IR   Lindell Spar, PharmD, BCPS Clinical Pharmacist  03/24/2019,7:40 AM

## 2019-03-24 NOTE — Progress Notes (Signed)
Peggy Welch   DOB:1941/05/08   CZ#:[id number]   FUX#:[id number]  Oncology follow up   Subjective: Pt is stable today, breathing seems to be better, no new complains   Objective:  Vitals:   03/24/19 1459 03/24/19 2121  BP: 127/69 119/79  Pulse:  81  Resp:  (!) 21  Temp:  98.6 F (37 C)  SpO2:  100%    Body mass index is 26.28 kg/m.  Intake/Output Summary (Last 24 hours) at 03/24/2019 2311 Last data filed at 03/24/2019 1800 Gross per 24 hour  Intake 959.67 ml  Output 600 ml  Net 359.67 ml     Sclerae unicteric  Oropharynx clear  No peripheral adenopathy  Lungs clear -- no rales or rhonchi, decreased breath sounds on bilateral lung bases  Heart regular rate and rhythm  Abdomen benign  MSK no focal spinal tenderness, (+) significant right arm lymphedema  Neuro nonfocal  Breast exam: Her entire right breast is occupied by tumor, with skin hyperpigmentation, and a small open wound in the inferior inner quadrant which is covered by bandage.  CBG (last 3)  No results for input(s): GLUCAP in the last 72 hours.   Labs:  Lab Results  Component Value Date   WBC 5.6 03/24/2019   HGB 11.3 (L) 03/24/2019   HCT 36.7 03/24/2019   MCV 97.9 03/24/2019   PLT 337 03/24/2019   NEUTROABS 3.3 03/22/2019    Urine Studies No results for input(s): UHGB, CRYS in the last 72 hours.  Invalid input(s): UACOL, UAPR, USPG, UPH, UTP, UGL, UKET, UBIL, UNIT, UROB, ULEU, UEPI, UWBC, URBC, [place], CAST, [place], [place]  Basic Metabolic Panel: Recent Labs  Lab 03/22/19 1831 03/23/19 0755 03/24/19 0432  NA 134* 138 138  K 2.5* 3.0* 3.0*  CL 89* 100 101  CO2 33* 28 29  GLUCOSE 101* 87 90  BUN 5* 5* 7*  CREATININE 0.64 0.55 0.60  CALCIUM 13.6* 11.6* 11.8*  MG 2.0  --  1.7  PHOS 2.0* 2.5 2.2*   GFR Estimated Creatinine Clearance: 54.6 mL/min (by C-G formula based on SCr of 0.6 mg/dL). Liver Function Tests: Recent Labs  Lab 03/22/19 1831 03/23/19 0755 03/24/19 0432  AST 59*  --  39   ALT 30  --  21  ALKPHOS 71  --  48  BILITOT 1.3*  --  1.1  PROT 7.9  --  6.4*  ALBUMIN 4.1 3.5 3.2*   No results for input(s): LIPASE, AMYLASE in the last 168 hours. No results for input(s): AMMONIA in the last 168 hours. Coagulation profile Recent Labs  Lab 03/22/19 2114  INR 1.1    CBC: Recent Labs  Lab 03/22/19 1831 03/23/19 0755 03/24/19 0432  WBC 5.8 6.2 5.6  NEUTROABS 3.3  --   --   HGB 13.5 12.3 11.3*  HCT 42.3 38.4 36.7  MCV 94.6 96.7 97.9  PLT 357 343 337   Cardiac Enzymes: No results for input(s): CKTOTAL, CKMB, CKMBINDEX, TROPONINI in the last 168 hours. BNP: Invalid input(s): POCBNP CBG: No results for input(s): GLUCAP in the last 168 hours. D-Dimer No results for input(s): DDIMER in the last 72 hours. Hgb A1c No results for input(s): HGBA1C in the last 72 hours. Lipid Profile No results for input(s): CHOL, HDL, LDLCALC, TRIG, CHOLHDL, LDLDIRECT in the last 72 hours. Thyroid function studies No results for input(s): TSH, T4TOTAL, T3FREE, THYROIDAB in the last 72 hours.  Invalid input(s): FREET3 Anemia work up No results for input(s): VITAMINB12, FOLATE, FERRITIN,  TIBC, IRON, RETICCTPCT in the last 72 hours. Microbiology Recent Results (from the past 240 hour(s))  SARS Coronavirus 2 (CEPHEID - Performed in [facility] hospital lab), Hosp Order     Status: None   Collection Time: 03/22/19  5:57 PM   Specimen: Nasopharyngeal Swab  Result Value Ref Range Status   SARS Coronavirus 2 NEGATIVE NEGATIVE Final    Comment: (NOTE) If result is NEGATIVE SARS-CoV-2 target nucleic acids are NOT DETECTED. The SARS-CoV-2 RNA is generally detectable in upper and lower  respiratory specimens during the acute phase of infection. The lowest  concentration of SARS-CoV-2 viral copies this assay can detect is 250  copies / mL. A negative result does not preclude SARS-CoV-2 infection  and should not be used as the sole basis for treatment or other  patient  management decisions.  A negative result may occur with  improper specimen collection / handling, submission of specimen other  than nasopharyngeal swab, presence of viral mutation(s) within the  areas targeted by this assay, and inadequate number of viral copies  (<250 copies / mL). A negative result must be combined with clinical  observations, patient history, and epidemiological information. If result is POSITIVE SARS-CoV-2 target nucleic acids are DETECTED. The SARS-CoV-2 RNA is generally detectable in upper and lower  respiratory specimens dur ing the acute phase of infection.  Positive  results are indicative of active infection with SARS-CoV-2.  Clinical  correlation with patient history and other diagnostic information is  necessary to determine patient infection status.  Positive results do  not rule out bacterial infection or co-infection with other viruses. If result is PRESUMPTIVE POSTIVE SARS-CoV-2 nucleic acids MAY BE PRESENT.   A presumptive positive result was obtained on the submitted specimen  and confirmed on repeat testing.  While 2019 novel coronavirus  (SARS-CoV-2) nucleic acids may be present in the submitted sample  additional confirmatory testing may be necessary for epidemiological  and / or clinical management purposes  to differentiate between  SARS-CoV-2 and other Sarbecovirus currently known to infect humans.  If clinically indicated additional testing with an alternate test  methodology [phone]) is advised. The SARS-CoV-2 RNA is generally  detectable in upper and lower respiratory sp ecimens during the acute  phase of infection. The expected result is Negative. Fact Sheet for Patients:  StrictlyIdeas.no Fact Sheet for Healthcare Providers: BankingDealers.co.za This test is not yet approved or cleared by the [place] FDA and has been authorized for detection and/or diagnosis of SARS-CoV-2 by FDA under  an Emergency Use Authorization (EUA).  This EUA will remain in effect (meaning this test can be used) for the duration of the COVID-19 declaration under Section 564(b)(1) of the Act, 21 U.S.C. section 360bbb-3(b)(1), unless the authorization is terminated or revoked sooner. Performed at [facility], [place] [address], [place], [place] [address]       Studies:  Dg Chest 1 View  Result Date: 03/24/2019 CLINICAL DATA:  Status post right thoracentesis EXAM: CHEST  1 VIEW COMPARISON:  March 22, 2019 FINDINGS: There is interval resolution of right-sided pleural effusion. No pneumothorax. Streaky atelectasis seen at the right lung base. Again noted is a moderate left pleural effusion. The cardiomediastinal silhouette is unchanged with aortic knob calcifications. Degenerative changes seen at both shoulders. IMPRESSION: 1. No right-sided pneumothorax 2. Moderate left pleural effusion. Electronically Signed   By: Prudencio Pair M.D.   On: 03/24/2019 15:29   Dg Swallowing Func-speech Pathology  Result Date: 03/23/2019 Objective Swallowing Evaluation:  Type of Study: MBS-Modified Barium Swallow Study  Patient Details Name: TAYTE CHILDERS MRN: [id number] Date of Birth: October 15, 1940 Today's Date: 03/23/2019 Time: SLP Start Time (ACUTE ONLY): 0810 -SLP Stop Time (ACUTE ONLY): 0840 SLP Time Calculation (min) (ACUTE ONLY): 30 min Past Medical History: Past Medical History: Diagnosis Date . Arthritis  . Asthma  . Asthma exacerbation 10/14/2011 . Blood transfusion without reported diagnosis  . Hepatitis C   from blood transfusion . Hypertension  . Pneumonia 4/02016 . rt breast ca dx'd 2019 Past Surgical History: Past Surgical History: Procedure Laterality Date . CHOLECYSTECTOMY  02/21/2002 . JOINT REPLACEMENT    Bilateral total knee replacements . knee replacements Bilateral  . Right Rotator Cuff Repair  08/12/2006 . TOTAL HIP ARTHROPLASTY Right 11/22/2014  dr Lorin Mercy . TOTAL HIP ARTHROPLASTY Right 11/22/2014   Procedure: TOTAL HIP ARTHROPLASTY ANTERIOR APPROACH;  Surgeon: Marybelle Killings, MD;  Location: [facility];  Service: Orthopedics;  Laterality: Right; HPI: pt is a 78 yo female with h/o breast cancer admitted to [facility] with dysphagia.  Pt found to have hypoxia also.  MBS ordered.  Pt reports dysphagia clinically with symptoms including coughing on water immediately after swallowing.  Pt CT chest showed "Abnormal soft tissue throughout the right breast and extending to the right axilla with involvement of the right pectoralis musculature (series 7/image 27), measuring approximately 4.5 x 14.5 cm in axial dimension. While some of this appearance may reflect radiation change, the overall appearance is considered concerning for infiltrating tumor which has progressed from prior".  Pt is also positive for pulmonary embolism.  She also has bilateral pleural effusions.  Per MD notes, pt has not followed oncology for one year and does not want further treatment.  Subjective: pt awake in chair, very HOH Assessment / Plan / Recommendation CHL IP CLINICAL IMPRESSIONS 03/23/2019 Clinical Impression Patient presents with mild pharyngeal dysphagia with motor impairments but no aspiration.  Pt's dysphagia marked by decreased timing of laryngeal closure which allows consistent laryngeal penetration of thin liquids and pharyngeal retention clearance.  She is however protective of her airway and independently conducts dry swallows with breath hold to clear larynx/pharynx of liquid residual.  Indequate epiglottic deflection negatively impacts solids/puree pharyngeal clearance although pt is sensate.  Reflexive dry swallow decreased solid/puree residuals and liquid "wash" helpful to clear pharynx.  Postures attempted (head turn right and chin tuck) although neck ROM very limited thus not adequately performed nor were they helpful to prevent residuals or decrease amount of laryngeal penetration.  Pt with increased aspiration risk with liquid using  chin tuck as it contributes to spillage of residuals from pyriform sinus into larynx.  Pt is compensating well for her dysphagia without airway infiltration.  Recommend pt maximize her liquid nutritional intake as liquids clear pharynx easier than solids.  Pt reports she has been drinking liquid supplements at home.  Recommend dys3/thin diet with strict aspiration precautions.  Given pt reports xerostomia, recommend start all po with liquids.  Question if pt's tumor progression may be impacting her pharyngeal and esophageal swallowing ability.  Will follow up for pt education to compensation strategies.  Note per review of chart, she did not want further treatment of her breast cancer thus compensation for dysphagia is likely optimal route for her goals.Of note, pt did cough x1 during MBS but there was not barium visualized in her trachea. SLP Visit Diagnosis Dysphagia, pharyngoesophageal phase (R13.14) Attention and concentration deficit following -- Frontal lobe and executive function deficit following -- Impact  on safety and function Mild aspiration risk   CHL IP TREATMENT RECOMMENDATION 12/04/2014 Treatment Recommendations No treatment recommended at this time   No flowsheet data found. CHL IP DIET RECOMMENDATION 03/23/2019 SLP Diet Recommendations Dysphagia 3 (Mech soft) solids;Thin liquid Liquid Administration via Cup;Straw Medication Administration Crushed with puree Compensations Slow rate;Small sips/bites;Multiple dry swallows after each bite/sip;Follow solids with liquid Postural Changes Remain semi-upright after after feeds/meals (Comment);Seated upright at 90 degrees   CHL IP OTHER RECOMMENDATIONS 03/23/2019 Recommended Consults -- Oral Care Recommendations Oral care BID Other Recommendations --   CHL IP FOLLOW UP RECOMMENDATIONS 12/04/2014 Follow up Recommendations None   CHL IP FREQUENCY AND DURATION 03/23/2019 Speech Therapy Frequency (ACUTE ONLY) min 1 x/week Treatment Duration 1 week      CHL IP ORAL  PHASE 03/23/2019 Oral Phase Impaired Oral - Pudding Teaspoon -- Oral - Pudding Cup -- Oral - Honey Teaspoon -- Oral - Honey Cup -- Oral - Nectar Teaspoon -- Oral - Nectar Cup WFL;Piecemeal swallowing Oral - Nectar Straw -- Oral - Thin Teaspoon WFL;Piecemeal swallowing Oral - Thin Cup [facility];Piecemeal swallowing Oral - Thin Straw Piecemeal swallowing;WFL Oral - Puree Lingual pumping Oral - Mech Soft WFL Oral - Regular -- Oral - Multi-Consistency -- Oral - Pill -- Oral Phase - Comment --  CHL IP PHARYNGEAL PHASE 03/23/2019 Pharyngeal Phase -- Pharyngeal- Pudding Teaspoon -- Pharyngeal -- Pharyngeal- Pudding Cup -- Pharyngeal -- Pharyngeal- Honey Teaspoon -- Pharyngeal -- Pharyngeal- Honey Cup -- Pharyngeal -- Pharyngeal- Nectar Teaspoon -- Pharyngeal -- Pharyngeal- Nectar Cup -- Pharyngeal -- Pharyngeal- Nectar Straw -- Pharyngeal -- Pharyngeal- Thin Teaspoon WFL Pharyngeal Material does not enter airway Pharyngeal- Thin Cup Reduced airway/laryngeal closure;Penetration/Aspiration during swallow;Penetration/Aspiration before swallow;Pharyngeal residue - pyriform;Pharyngeal residue - valleculae Pharyngeal Material enters airway, remains ABOVE vocal cords then ejected out Pharyngeal- Thin Straw Reduced airway/laryngeal closure;Pharyngeal residue - valleculae;Pharyngeal residue - pyriform Pharyngeal Material enters airway, remains ABOVE vocal cords then ejected out Pharyngeal- Puree Reduced epiglottic inversion;Reduced tongue base retraction;Pharyngeal residue - valleculae;Pharyngeal residue - pyriform Pharyngeal Material does not enter airway Pharyngeal- Mechanical Soft Reduced tongue base retraction;Reduced epiglottic inversion;Pharyngeal residue - valleculae;Pharyngeal residue - pyriform Pharyngeal Material does not enter airway Pharyngeal- Regular -- Pharyngeal -- Pharyngeal- Multi-consistency -- Pharyngeal -- Pharyngeal- Pill -- Pharyngeal -- Pharyngeal Comment pt with minimal neck ROM and thus chin tuck nor head turn  right were nof performed adequately and they were not helpful in preventing laryngeal penetration or decreasing pharyngeal stasis; reflexive dry swallows with breath hold with liquids protective of her airway and liquid swallow post-swallow of food decreased pharyngeal residuals  CHL IP CERVICAL ESOPHAGEAL PHASE 03/23/2019 Cervical Esophageal Phase Impaired Pudding Teaspoon -- Pudding Cup -- Honey Teaspoon -- Honey Cup -- Nectar Teaspoon -- Nectar Cup -- Nectar Straw -- Thin Teaspoon -- Thin Cup -- Thin Straw -- Puree -- Mechanical Soft -- Regular -- Multi-consistency -- Pill -- Cervical Esophageal Comment With puree, pt appeared with halting below CP but this did clear with liquids.  Upon esophageal sweep, pt appeared with minimal esophagus - suspect WFL.  Radiologist not present to confirm findings. Luanna Salk, MS [facility] SLP Acute Rehab Services Pager [phone] Office [phone] Macario Golds 03/23/2019, 11:08 AM              US Thoracentesis Asp Pleural Space W/img Guide  Result Date: 03/24/2019 INDICATION: Patient with history of metastatic breast cancer, dyspnea, and bilateral pleural effusions. Request is made for diagnostic and therapeutic right thoracentesis. EXAM: ULTRASOUND GUIDED DIAGNOSTIC AND THERAPEUTIC  RIGHT THORACENTESIS MEDICATIONS: 10 mL 1% lidocaine COMPLICATIONS: None immediate. PROCEDURE: An ultrasound guided thoracentesis was thoroughly discussed with the patient and questions answered. The benefits, risks, alternatives and complications were also discussed. The patient understands and wishes to proceed with the procedure. Written consent was obtained. Ultrasound was performed to localize and mark an adequate pocket of fluid in the right chest. The area was then prepped and draped in the normal sterile fashion. 1% Lidocaine was used for local anesthesia. Under ultrasound guidance a 6 Fr Safe-T-Centesis catheter was introduced. Thoracentesis was performed. The catheter was removed  and a dressing applied. FINDINGS: A total of approximately 850 mL of hazy gold fluid was removed. Samples were sent to the laboratory as requested by the clinical team. IMPRESSION: Successful ultrasound guided right thoracentesis yielding 850 mL of pleural fluid. Read by: Earley Abide, PA-C No pneumothorax on follow-up radiograph. Electronically Signed   By: Lucrezia Europe M.D.   On: 03/24/2019 15:34    Assessment: 78 y.o.   1. RLL PE 2.  Bilateral pleural effusion, s/p thoracentesis today  3.  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  Hypercalcemia, likely related to her malignancy 5.  Hypokalemia, replaced 6. HTN 7. Dysphagia, unclear etiology    Plan:  -continue IV heparin drip, will consider switching to Lovenox or Xarelto on discharge, coumadin will be difficult to manage due to her noncompliance  -she received hydration, lasix and zometa, Calcium 11.8 today, improved  -I spoke with her daughter, she is aware that her mother does not want any cancer treatment and she is very supportive. She declined hospice and states pt has strong family support  -I discussed f/u or call me as needed, she appreciated the offer and states she will call me if needed. I discussed that she likely will have more symptoms especially recurrent pleural effusion and dyspnea etc  -I do not recommend further image or other work up for her cancer -will sign off for now, I will be happy to see her back in my clinic, if she calls me.      Truitt Merle, MD 03/24/2019

## 2019-03-25 ENCOUNTER — Inpatient Hospital Stay (HOSPITAL_COMMUNITY): Payer: Medicare Other

## 2019-03-25 DIAGNOSIS — I2699 Other pulmonary embolism without acute cor pulmonale: Secondary | ICD-10-CM

## 2019-03-25 LAB — COMPREHENSIVE METABOLIC PANEL
ALT: 19 U/L (ref 0–44)
AST: 35 U/L (ref 15–41)
Albumin: 2.9 g/dL — ABNORMAL LOW (ref 3.5–5.0)
Alkaline Phosphatase: 44 U/L (ref 38–126)
Anion gap: 9 (ref 5–15)
BUN: 8 mg/dL (ref 8–23)
CO2: 26 mmol/L (ref 22–32)
Calcium: 11.1 mg/dL — ABNORMAL HIGH (ref 8.9–10.3)
Chloride: 104 mmol/L (ref 98–111)
Creatinine, Ser: 0.62 mg/dL (ref 0.44–1.00)
GFR calc Af Amer: >60 mL/min (ref >60)
GFR calc non Af Amer: >60 mL/min (ref >60)
Glucose, Bld: 94 mg/dL (ref 70–99)
Potassium: 3.3 mmol/L — ABNORMAL LOW (ref 3.5–5.1)
Sodium: 139 mmol/L (ref 135–145)
Total Bilirubin: 1 mg/dL (ref 0.3–1.2)
Total Protein: 5.8 g/dL — ABNORMAL LOW (ref 6.5–8.1)

## 2019-03-25 LAB — PHOSPHORUS: Phosphorus: 2.8 mg/dL (ref 2.5–4.6)

## 2019-03-25 LAB — BODY FLUID CELL COUNT WITH DIFFERENTIAL
Eos, Fluid: 0 %
Lymphs, Fluid: 66 %
Monocyte-Macrophage-Serous Fluid: 32 % — ABNORMAL LOW (ref 50–90)
Neutrophil Count, Fluid: 2 % (ref 0–25)
Total Nucleated Cell Count, Fluid: 251 cu mm (ref 0–1000)

## 2019-03-25 LAB — GLUCOSE, PLEURAL OR PERITONEAL FLUID: Glucose, Fluid: 121 mg/dL

## 2019-03-25 LAB — CBC
HCT: 38.2 % (ref 36.0–46.0)
Hemoglobin: 11.4 g/dL — ABNORMAL LOW (ref 12.0–15.0)
MCH: 30.1 pg (ref 26.0–34.0)
MCHC: 29.8 g/dL — ABNORMAL LOW (ref 30.0–36.0)
MCV: 100.8 fL — ABNORMAL HIGH (ref 80.0–100.0)
Platelets: 301 10*3/uL (ref 150–400)
RBC: 3.79 MIL/uL — ABNORMAL LOW (ref 3.87–5.11)
RDW: 14.7 % (ref 11.5–15.5)
WBC: 5.9 10*3/uL (ref 4.0–10.5)
nRBC: 0 % (ref 0.0–0.2)

## 2019-03-25 LAB — PROTEIN, PLEURAL OR PERITONEAL FLUID: Total protein, fluid: 3.5 g/dL

## 2019-03-25 LAB — MAGNESIUM: Magnesium: 1.8 mg/dL (ref 1.7–2.4)

## 2019-03-25 LAB — HEPARIN LEVEL (UNFRACTIONATED): Heparin Unfractionated: 0.56 IU/mL (ref 0.30–0.70)

## 2019-03-25 LAB — LACTATE DEHYDROGENASE, PLEURAL OR PERITONEAL FLUID: LD, Fluid: 214 U/L — ABNORMAL HIGH (ref 3–23)

## 2019-03-25 MED ORDER — POTASSIUM CHLORIDE CRYS ER 20 MEQ PO TBCR
40.0000 meq | EXTENDED_RELEASE_TABLET | Freq: Once | ORAL | Status: AC
  Administered 2019-03-25: 18:00:00 40 meq via ORAL
  Filled 2019-03-25: qty 2

## 2019-03-25 MED ORDER — LIDOCAINE HCL 1 % IJ SOLN
INTRAMUSCULAR | Status: AC
  Filled 2019-03-25: qty 10

## 2019-03-25 NOTE — Procedures (Signed)
Ultrasound-guided diagnostic and therapeutic left thoracentesis performed yielding 1.4 liters of yellow fluid. No immediate complications. Follow-up chest x-ray pending. A portion of the fluid was sent to the lab for preordered studies. EBL < 1 cc.

## 2019-03-25 NOTE — Progress Notes (Signed)
  Speech Language Pathology Treatment: Dysphagia  Patient Details Name: Peggy Welch MRN: [id number] DOB: 1940-09-08 Today's Date: 03/25/2019 Time: 0950-1016 SLP Time Calculation (min) (ACUTE ONLY): 26 min  Assessment / Plan / Recommendation Clinical Impression  Educated pt to importance of oral care for pulmonary health and provided her with supplies, brushed her dentures for her in the sink; Reviewed aspiration/dysphagia mitigation strategies with Mod cues to implement but min cues to verbalize.  Observed pt consuming intake - today much improved swallow function compared to Wednesday's MBS - No indication of aspiration with all intake observed nor complaint of dysphagia.   She does state she does not eat meat and thus recommend maximize protein via other foods - RD following.  Pt self admits improved tolerance of po intake!     HPI HPI: pt is a 78 yo female with h/o breast cancer admitted to [facility] with dysphagia.  Pt found to have hypoxia also.  MBS ordered.  Pt reports dysphagia clinically with symptoms including coughing on water immediately after swallowing.  Pt CT chest showed "Abnormal soft tissue throughout the right breast and extending to the right axilla with involvement of the right pectoralis musculature (series 7/image 27), measuring approximately 4.5 x 14.5 cm in axial dimension. While some of this appearance may reflect radiation change, the overall appearance is considered concerning for infiltrating tumor which has progressed from prior".  Pt is also positive for pulmonary embolism.  She also has bilateral pleural effusions.  Per MD notes, pt has not followed oncology for one year and does not want further treatment.      SLP Plan  Continue with current plan of care       Recommendations  Diet recommendations: Dysphagia 2 (fine chop);Thin liquid Liquids provided via: Cup;Straw Medication Administration: Crushed with puree Supervision: Patient able to self  feed Compensations: Slow rate;Small sips/bites;Multiple dry swallows after each bite/sip;Follow solids with liquid(start intake with liquids) Postural Changes and/or Swallow Maneuvers: Seated upright 90 degrees;Upright 30-60 min after meal                Oral Care Recommendations: Oral care QID SLP Visit Diagnosis: Dysphagia, pharyngoesophageal phase (R13.14) Plan: Continue with current plan of care       GO                Macario Golds 03/25/2019, 10:27 AM  Luanna Salk, MS [facility] SLP Acute Rehab Services Pager [phone] Office [phone]

## 2019-03-25 NOTE — Progress Notes (Signed)
ANTICOAGULATION CONSULT NOTE - Follow Up Consult  Pharmacy Consult for Heparin Indication: pulmonary embolus  Allergies  Allergen Reactions  . 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    Swelling in throat    Patient Measurements: Height: 5\' 4"  (162.6 cm) Weight: 155 lb 10.3 oz (70.6 kg) IBW/kg (Calculated) : 54.7 Heparin Dosing Weight: 68.8 kg  Vital Signs: Temp: 98.4 F (36.9 C) (07/31 0453) Temp Source: Oral (07/31 0453) BP: 116/78 (07/31 0453) Pulse Rate: 79 (07/31 0453)  Labs: Recent Labs    03/22/19 2114  03/23/19 0755 03/23/19 1626 03/24/19 0432 03/25/19 0407  HGB  --   --  12.3  --  11.3* 11.4*  HCT  --   --  38.4  --  36.7 38.2  PLT  --   --  343  --  337 301  APTT 29  --   --   --   --   --   LABPROT 14.0  --   --   --   --   --   INR 1.1  --   --   --   --   --   HEPARINUNFRC  --    < > 0.49 0.38 0.55 0.56  CREATININE  --   --  0.55  --  0.60 0.62   < > = values in this interval not displayed.    Estimated Creatinine Clearance: 55 mL/min (by C-G formula based on SCr of 0.62 mg/dL).   Assessment: 40 y/oF with new PE. Pharmacy consulted for IV heparin dosing. Patient not on any anticoagulants PTA. Baseline labs WNL. S/p R thoracentesis yesterday.   Today, 03/25/19   Heparin level = 0.56 units/mL, remains therapeutic  Hgb stable at 11.4, Pltc WNL/stable  No bleeding or infusion issues noted per nursing  Noted consideration of L thoracentesis today per MD notes  Goal of Therapy:  Heparin level 0.3-0.7 units/ml Monitor platelets by anticoagulation protocol: Yes   Plan:  Continue heparin infusion at 1000 units/hr Daily CBC, heparin level Monitor closely for s/sx of bleeding   Lindell Spar, PharmD, BCPS Clinical Pharmacist  03/25/2019,7:15 AM

## 2019-03-25 NOTE — Progress Notes (Signed)
PROGRESS NOTE    [name]  BHA:[id number] DOB: 07/04/1941 DOA: 03/22/2019 PCP: Lucianne Lei, MD   Brief Narrative:  Peggy Welch is Peggy Welch 78 y.o. 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  She has also had some shortness of breath.  Patient has Peggy Welch known history of right-sided breast cancer with involvement of the lung and bones.  Previously seen by oncology Dr. Burr Medico, but has not followed up in over Jayra Choyce year.  Per daughter, patient expressed wishes to avoid further treatment regarding her cancer.  Patient otherwise denies any recent subjective fevers, diaphoresis, chest pain, abdominal pain, diarrhea, constipation, or dysuria.  ED Course:  Initial vitals showed BP 135/117, pulse 95, RR 16, temp 99.2 Fahrenheit, SPO2 97% on room air.  Labs are notable for potassium 2.5, magnesium 2.0, sodium 134, chloride 89, bicarb 33, BUN 5, creatinine 0.64, calcium 13.6, albumin 4.1, phosphorus 2.0.  WBC 5.8, hemoglobin 13.5, platelets 357,000.  SARS-CoV-2 test was obtained and pending.  Portable chest x-ray showed moderate bilateral pleural effusions with basilar atelectasis.  CTA chest PE study showed Rodel Glaspy lobar/segmental PE within Sherria Riemann branch of the right lower lobe pulmonary artery with overall small clot burden or evidence of right heart strain.  Mild interstitial edema and moderate to large bilateral pleural effusions are noted with dependent atelectasis in bilateral lower lobes.  Abnormal soft tissue throughout the right breast extending to the right axilla and involving the right pectoralis musculature was noted with appearance concerning for progression of infiltrating tumor when compared to prior.  Patient was given 1 L normal saline, 40 mEq of oral potassium, IV K 10 mEq x 4 runs, and started on IV  heparin drip.  The hospitalist service was consulted to admit for further evaluation and management.  Assessment & Plan:   Principal Problem:   Hypercalcemia of malignancy Active Problems:   Hypokalemia   Breast cancer of lower-inner quadrant of right female breast ([place])   Pulmonary embolism ([place])   Dysphagia   Peggy Welch is Peggy Welch 78 y.o. 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  Hypercalcemia: Moderate hypercalcemia 13.6 on admission likely secondary to malignancy in addition to HCTZ use   Improved since admission Will give dose of lasix x1 imaging findings concerning for interstitial edema Appreciate oncology who has recommended adding zometa Continue maintenance fluids overnight, monitor strict I/O's Hold home HCTZ  Hypokalemia/hypophosphatemia: Improved, replace and follow  Lobar/segmental PE within branch of the right lower lobe pulmonary artery: Likely due to malignancy.  Has been started on IV heparin.   Follow LE [place] bilaterally (negative for DVT) Oncology recommending lovenox vs xarelto at d/c - will ask CM to help look into cost (pending)  Right breast cancer with metastases to lung and bones and RUE lymphedema: CT scan shows changes concerning for progression of infiltrating tumor.  Patient was previously followed by oncology, Dr. Burr Medico, but has not followed up in over Peggy Welch year.  Per discussion with daughter, patient has expressed to her that she did not want continued therapy for cancer and that is why she stopped following with oncology. - appreciate oncology recommendations -> no further imaging or w/u for cancer recommended at this time in light of pt declining  any cancer treatment  - palliative care c/s -> pt has repeatedly declined hospice, though her daughter and sister seem open to it.  Will plan to have palliative follow outpatient.   Bilateral Exudative pleural effusions: Suspect due to malignancy.  She is currently saturating well on room air does not appear to have respiratory distress.   S/p R thoracentesis on 7/30 - follow pending labs - exudative, pending cytology, negative cx to date S/p L thoracentesis on 7/31 - exudative, pending cytology, pending cx  Dysphagia  Poor appetite: Reported recent difficulty with swallowing and coughing.  Possibly related to tumor progression.  Have ordered SLP/swallow evaluation. Recommending dysphagia 2, thin liquid Pt c/o poor appetite and asking for something for appetite - start remeron  Hypertension: Hypertensive on admission.  Holding home HCTZ as above.  Use IV hydralazine as needed.  Asthma: No active wheezing.  Do not think presenting symptoms are related to asthma exacerbation.  Can continue albuterol as needed.  DVT prophylaxis: heparin Code Status: DNR Family Communication: no answer from daughter, 7/31.  Called sister Disposition Plan: hopefully d/c home tomorrow  Consultants:   Oncology  Palliative care   Procedures:   none  Antimicrobials:  Anti-infectives (From admission, onward)   None     Subjective: Agreeable to procedure  Objective: Vitals:   03/25/19 0453 03/25/19 1330 03/25/19 1510 03/25/19 1557  BP: 116/78 121/79 (!) 158/106 130/77  Pulse: 79 77  90  Resp: 20 18  20   Temp: 98.4 F (36.9 C) 98.7 F (37.1 C)  98.7 F (37.1 C)  TempSrc: Oral Oral  Oral  SpO2: 100% 100%  100%  Weight: 70.6 kg     Height:        Intake/Output Summary (Last 24 hours) at 03/25/2019 1720 Last data filed at 03/25/2019 1300 Gross per 24 hour  Intake 1676.57 ml  Output 1100 ml  Net 576.57 ml   Filed Weights   03/23/19 0621 03/24/19 0423 03/25/19 0453  Weight: 69.8 kg 69.4 kg 70.6 kg    Examination:  General: No acute distress. Cardiovascular: Heart sounds show Peggy Welch regular rate, and rhythm Lungs: Clear to auscultation bilaterally Abdomen:  Soft, nontender, nondistended  Neurological: Alert. Moves all extremities 4. Cranial nerves II through XII grossly intact. Skin: Warm and dry. No rashes or lesions. Extremities: No clubbing or cyanosis. No edema.   Data Reviewed: I have personally reviewed following labs and imaging studies  CBC: Recent Labs  Lab 03/22/19 1831 03/23/19 0755 03/24/19 0432 03/25/19 0407  WBC 5.8 6.2 5.6 5.9  NEUTROABS 3.3  --   --   --   HGB 13.5 12.3 11.3* 11.4*  HCT 42.3 38.4 36.7 38.2  MCV 94.6 96.7 97.9 100.8*  PLT 357 343 337 858   Basic Metabolic Panel: Recent Labs  Lab 03/22/19 1831 03/23/19 0755 03/24/19 0432 03/25/19 0407  NA 134* 138 138 139  K 2.5* 3.0* 3.0* 3.3*  CL 89* 100 101 104  CO2 33* 28 29 26   GLUCOSE 101* 87 90 94  BUN 5* 5* 7* 8  CREATININE 0.64 0.55 0.60 0.62  CALCIUM 13.6* 11.6* 11.8* 11.1*  MG 2.0  --  1.7 1.8  PHOS 2.0* 2.5 2.2* 2.8   GFR: Estimated Creatinine Clearance: 55 mL/min (by C-G formula based on SCr of 0.62 mg/dL). Liver Function Tests: Recent Labs  Lab 03/22/19 1831 03/23/19 0755 03/24/19 0432 03/25/19 0407  AST 59*  --  39 35  ALT 30  --  21 19  ALKPHOS 71  --  48 44  BILITOT 1.3*  --  1.1 1.0  PROT 7.9  --  6.4* 5.8*  ALBUMIN 4.1 3.5 3.2* 2.9*   No results for input(s): LIPASE, AMYLASE in the last 168 hours. No results for input(s): AMMONIA in the last 168 hours. Coagulation Profile: Recent Labs  Lab 03/22/19 2114  INR 1.1   Cardiac Enzymes: No results for input(s): CKTOTAL, CKMB, CKMBINDEX, TROPONINI in the last 168 hours. BNP (last 3 results) No results for input(s): PROBNP in the last 8760 hours. HbA1C: No results for input(s): HGBA1C in the last 72 hours. CBG: No results for input(s): GLUCAP in the last 168 hours. Lipid Profile: No results for input(s): CHOL, HDL, LDLCALC, TRIG, CHOLHDL, LDLDIRECT in the last 72 hours. Thyroid Function Tests: No results for input(s): TSH, T4TOTAL, FREET4, T3FREE, THYROIDAB in the last  72 hours. Anemia Panel: No results for input(s): VITAMINB12, FOLATE, FERRITIN, TIBC, IRON, RETICCTPCT in the last 72 hours. Sepsis Labs: No results for input(s): PROCALCITON, LATICACIDVEN in the last 168 hours.  Recent Results (from the past 240 hour(s))  SARS Coronavirus 2 (CEPHEID - Performed in [facility] hospital lab), Hosp Order     Status: None   Collection Time: 03/22/19  5:57 PM   Specimen: Nasopharyngeal Swab  Result Value Ref Range Status   SARS Coronavirus 2 NEGATIVE NEGATIVE Final    Comment: (NOTE) If result is NEGATIVE SARS-CoV-2 target nucleic acids are NOT DETECTED. The SARS-CoV-2 RNA is generally detectable in upper and lower  respiratory specimens during the acute phase of infection. The lowest  concentration of SARS-CoV-2 viral copies this assay can detect is 250  copies / mL. Kayah Hecker negative result does not preclude SARS-CoV-2 infection  and should not be used as the sole basis for treatment or other  patient management decisions.  Jashan Cotten negative result may occur with  improper specimen collection / handling, submission of specimen other  than nasopharyngeal swab, presence of viral mutation(s) within the  areas targeted by this assay, and inadequate number of viral copies  (<250 copies / mL). Rickell Wiehe negative result must be combined with clinical  observations, patient history, and epidemiological information. If result is POSITIVE SARS-CoV-2 target nucleic acids are DETECTED. The SARS-CoV-2 RNA is generally detectable in upper and lower  respiratory specimens dur ing the acute phase of infection.  Positive  results are indicative of active infection with SARS-CoV-2.  Clinical  correlation with patient history and other diagnostic information is  necessary to determine patient infection status.  Positive results do  not rule out bacterial infection or co-infection with other viruses. If result is PRESUMPTIVE POSTIVE SARS-CoV-2 nucleic acids MAY BE PRESENT.   Celine Dishman presumptive  positive result was obtained on the submitted specimen  and confirmed on repeat testing.  While 2019 novel coronavirus  (SARS-CoV-2) nucleic acids may be present in the submitted sample  additional confirmatory testing may be necessary for epidemiological  and / or clinical management purposes  to differentiate between  SARS-CoV-2 and other Sarbecovirus currently known to infect humans.  If clinically indicated additional testing with an alternate test  methodology [phone]) is advised. The SARS-CoV-2 RNA is generally  detectable in upper and lower respiratory sp ecimens during the acute  phase of infection. The expected result is Negative. Fact Sheet for Patients:  StrictlyIdeas.no Fact Sheet for Healthcare Providers: BankingDealers.co.za This test is not yet approved or cleared by the [place] FDA and has been authorized for detection and/or diagnosis of  SARS-CoV-2 by FDA under an Emergency Use Authorization (EUA).  This EUA will remain in effect (meaning this test can be used) for the duration of the COVID-19 declaration under Section 564(b)(1) of the Act, 21 U.S.C. section 360bbb-3(b)(1), unless the authorization is terminated or revoked sooner. Performed at [facility], [place] [address], [place], [place] [address]   Body fluid culture     Status: None (Preliminary result)   Collection Time: 03/24/19  3:20 PM   Specimen: Pleural, Right; Pleural Fluid  Result Value Ref Range Status   Specimen Description   Final    PLEURAL RIGHT Performed at [facility] [address] [place], [place] [address]    Special Requests   Final    NONE Performed at [facility], [place] [address], [place], [place] [address]    Gram Stain   Final    MODERATE WBC PRESENT, PREDOMINANTLY MONONUCLEAR NO ORGANISMS SEEN    Culture   Final    NO GROWTH < 24 HOURS Performed at [facility] [address] [place], [place] [address]    Report Status PENDING  Incomplete         Radiology Studies: Dg Chest 1 View  Result Date: 03/25/2019 CLINICAL DATA:  78 year old female with Odyn Turko history of thoracentesis EXAM: CHEST  1 VIEW COMPARISON:  March 24, 2019, March 23, 2019 FINDINGS: Cardiomediastinal silhouette likely unchanged in size and contour. Decreased opacity at the left lung base with no visualized pneumothorax, and improved aeration. Blunting at the right costophrenic angle persists. Degenerative changes of the shoulders. Hazy opacity of the bilateral lower lungs. IMPRESSION: Decreased left-sided pleural fluid with no visualized pneumothorax. Persisting opacities at the bilateral lung base, including likely small trace right pleural effusion. Electronically Signed   By: Corrie Mckusick D.O.   On: 03/25/2019 15:48   Dg Chest 1 View  Result Date: 03/24/2019 CLINICAL DATA:  Status post right thoracentesis EXAM: CHEST  1 VIEW COMPARISON:  March 22, 2019 FINDINGS: There is interval resolution of right-sided pleural effusion. No pneumothorax. Streaky atelectasis seen at the right lung base. Again noted is Annison Birchard moderate left pleural effusion. The cardiomediastinal silhouette is unchanged with aortic knob calcifications. Degenerative changes seen at both shoulders. IMPRESSION: 1. No right-sided pneumothorax 2. Moderate left pleural effusion. Electronically Signed   By: Prudencio Pair M.D.   On: 03/24/2019 15:29   Vas [place] Lower Extremity Venous (dvt)  Result Date: 03/25/2019  Lower Venous Study Indications: Pulmonary embolism.  Comparison Study: no prior Performing Technologist: Abram Sander RVS  Examination Guidelines: Emanuela Runnion complete evaluation includes B-mode imaging, spectral Doppler, color Doppler, and power Doppler as needed of all accessible portions of each vessel. Bilateral testing is considered an integral part of Erez Mccallum complete examination. Limited examinations for reoccurring indications may be  performed as noted.  +---------+---------------+---------+-----------+----------+-------+ RIGHT    CompressibilityPhasicitySpontaneityPropertiesSummary +---------+---------------+---------+-----------+----------+-------+ CFV      Full           Yes      Yes                          +---------+---------------+---------+-----------+----------+-------+ SFJ      Full                                                 +---------+---------------+---------+-----------+----------+-------+  FV Prox  Full                                                 +---------+---------------+---------+-----------+----------+-------+ FV Mid   Full                                                 +---------+---------------+---------+-----------+----------+-------+ FV DistalFull                                                 +---------+---------------+---------+-----------+----------+-------+ PFV      Full                                                 +---------+---------------+---------+-----------+----------+-------+ POP      Full           Yes      Yes                          +---------+---------------+---------+-----------+----------+-------+ PTV      Full                                                 +---------+---------------+---------+-----------+----------+-------+ PERO     Full                                                 +---------+---------------+---------+-----------+----------+-------+   +---------+---------------+---------+-----------+----------+--------------+ LEFT     CompressibilityPhasicitySpontaneityPropertiesSummary        +---------+---------------+---------+-----------+----------+--------------+ CFV      Full           Yes      Yes                                 +---------+---------------+---------+-----------+----------+--------------+ SFJ      Full                                                         +---------+---------------+---------+-----------+----------+--------------+ FV Prox  Full                                                        +---------+---------------+---------+-----------+----------+--------------+ FV Mid   Full                                                        +---------+---------------+---------+-----------+----------+--------------+  FV DistalFull                                                        +---------+---------------+---------+-----------+----------+--------------+ PFV      Full                                                        +---------+---------------+---------+-----------+----------+--------------+ POP      Full           Yes      Yes                                 +---------+---------------+---------+-----------+----------+--------------+ PTV      Full                                                        +---------+---------------+---------+-----------+----------+--------------+ PERO                                                  Not visualized +---------+---------------+---------+-----------+----------+--------------+     Summary: Right: There is no evidence of deep vein thrombosis in the lower extremity. Left: No evidence of deep vein thrombosis in the lower extremity.  *See table(s) above for measurements and observations. Electronically signed by Curt Jews MD on 03/25/2019 at 1:51:23 PM.    Final    US Thoracentesis Asp Pleural Space W/img Guide  Result Date: 03/24/2019 INDICATION: Patient with history of metastatic breast cancer, dyspnea, and bilateral pleural effusions. Request is made for diagnostic and therapeutic right thoracentesis. EXAM: ULTRASOUND GUIDED DIAGNOSTIC AND THERAPEUTIC RIGHT THORACENTESIS MEDICATIONS: 10 mL 1% lidocaine COMPLICATIONS: None immediate. PROCEDURE: An ultrasound guided thoracentesis was thoroughly discussed with the patient and questions answered. The benefits, risks,  alternatives and complications were also discussed. The patient understands and wishes to proceed with the procedure. Written consent was obtained. Ultrasound was performed to localize and mark an adequate pocket of fluid in the right chest. The area was then prepped and draped in the normal sterile fashion. 1% Lidocaine was used for local anesthesia. Under ultrasound guidance Maybel Dambrosio 6 Fr Safe-T-Centesis catheter was introduced. Thoracentesis was performed. The catheter was removed and Addalyne Vandehei dressing applied. FINDINGS: Damaree Sargent total of approximately 850 mL of hazy gold fluid was removed. Samples were sent to the laboratory as requested by the clinical team. IMPRESSION: Successful ultrasound guided right thoracentesis yielding 850 mL of pleural fluid. Read by: Earley Abide, PA-C No pneumothorax on follow-up radiograph. Electronically Signed   By: Lucrezia Europe M.D.   On: 03/24/2019 15:34        Scheduled Meds: . feeding supplement  1 Container Oral Q24H  . feeding supplement (ENSURE ENLIVE)  237 mL Oral BID BM  . guaiFENesin  600 mg Oral BID  . lidocaine      . mirtazapine  7.5 mg Oral QHS  . multivitamin with minerals  1 tablet Oral Daily  . sodium chloride flush  3 mL Intravenous Q12H   Continuous Infusions: . heparin Stopped (03/25/19 1223)     LOS: 3 days    Time spent: over 30 min    Fayrene Helper, MD [facility] Pager AMION  If 7PM-7AM, please contact night-coverage [email or web address] Password [email or web address] 03/25/2019, 5:20 PM

## 2019-03-25 NOTE — Care Management Important Message (Signed)
Important Message  Patient Details IM Letter given to Cookie McGibboney RN to present to the Patient Name: Peggy Welch MRN: [id number] Date of Birth: Nov 07, 1940   Medicare Important Message Given:  Yes     Kerin Salen 03/25/2019, 11:13 AM

## 2019-03-25 NOTE — Progress Notes (Signed)
Lower extremity venous has been completed.   Preliminary results in CV Proc.   Abram Sander 03/25/2019 9:32 AM

## 2019-03-25 NOTE — Progress Notes (Signed)
Spoke with pt concerning home with Hospice, pt states that she do not want hospice. Offered HH, pt states that she has family memebers that are nurses and will not need anything at this time.

## 2019-03-26 LAB — COMPREHENSIVE METABOLIC PANEL
ALT: 14 U/L (ref 0–44)
AST: 26 U/L (ref 15–41)
Albumin: 2.5 g/dL — ABNORMAL LOW (ref 3.5–5.0)
Alkaline Phosphatase: 37 U/L — ABNORMAL LOW (ref 38–126)
Anion gap: 6 (ref 5–15)
BUN: 9 mg/dL (ref 8–23)
CO2: 28 mmol/L (ref 22–32)
Calcium: 9.7 mg/dL (ref 8.9–10.3)
Chloride: 105 mmol/L (ref 98–111)
Creatinine, Ser: 0.59 mg/dL (ref 0.44–1.00)
GFR calc Af Amer: >60 mL/min (ref >60)
GFR calc non Af Amer: >60 mL/min (ref >60)
Glucose, Bld: 92 mg/dL (ref 70–99)
Potassium: 3.5 mmol/L (ref 3.5–5.1)
Sodium: 139 mmol/L (ref 135–145)
Total Bilirubin: 1.3 mg/dL — ABNORMAL HIGH (ref 0.3–1.2)
Total Protein: 5.2 g/dL — ABNORMAL LOW (ref 6.5–8.1)

## 2019-03-26 LAB — CBC
HCT: 34.7 % — ABNORMAL LOW (ref 36.0–46.0)
Hemoglobin: 10.5 g/dL — ABNORMAL LOW (ref 12.0–15.0)
MCH: 30.2 pg (ref 26.0–34.0)
MCHC: 30.3 g/dL (ref 30.0–36.0)
MCV: 99.7 fL (ref 80.0–100.0)
Platelets: 284 10*3/uL (ref 150–400)
RBC: 3.48 MIL/uL — ABNORMAL LOW (ref 3.87–5.11)
RDW: 14.7 % (ref 11.5–15.5)
WBC: 9.2 10*3/uL (ref 4.0–10.5)
nRBC: 0 % (ref 0.0–0.2)

## 2019-03-26 LAB — MAGNESIUM: Magnesium: 1.5 mg/dL — ABNORMAL LOW (ref 1.7–2.4)

## 2019-03-26 LAB — HEPARIN LEVEL (UNFRACTIONATED): Heparin Unfractionated: 0.18 IU/mL — ABNORMAL LOW (ref 0.30–0.70)

## 2019-03-26 MED ORDER — HEPARIN (PORCINE) 25000 UT/250ML-% IV SOLN
1200.0000 [IU]/h | INTRAVENOUS | Status: AC
  Administered 2019-03-26: 1200 [IU]/h via INTRAVENOUS
  Filled 2019-03-26: qty 250

## 2019-03-26 MED ORDER — ADULT MULTIVITAMIN W/MINERALS CH: 1.0000 | ORAL_TABLET | Freq: Every day | ORAL | 0 refills | Status: AC

## 2019-03-26 MED ORDER — RIVAROXABAN (XARELTO) VTE STARTER PACK (15 & 20 MG): ORAL_TABLET | ORAL | 0 refills | Status: DC

## 2019-03-26 MED ORDER — RIVAROXABAN 15 MG PO TABS
15.0000 mg | ORAL_TABLET | Freq: Two times a day (BID) | ORAL | Status: DC
  Administered 2019-03-26 (×2): 15 mg via ORAL
  Filled 2019-03-26 (×2): qty 1

## 2019-03-26 MED ORDER — MIRTAZAPINE 7.5 MG PO TABS: 7.5000 mg | ORAL_TABLET | Freq: Every day | ORAL | 0 refills | Status: DC

## 2019-03-26 MED ORDER — HEPARIN BOLUS VIA INFUSION
2000.0000 [IU] | Freq: Once | INTRAVENOUS | Status: AC
  Administered 2019-03-26: 2000 [IU] via INTRAVENOUS
  Filled 2019-03-26: qty 2000

## 2019-03-26 MED ORDER — ALBUTEROL SULFATE (2.5 MG/3ML) 0.083% IN NEBU: 2.5000 mg | INHALATION_SOLUTION | RESPIRATORY_TRACT | 0 refills | Status: AC | PRN

## 2019-03-26 NOTE — TOC Progression Note (Signed)
Transition of Care [facility]) - Progression Note    Patient Details  Name: Peggy Welch MRN: [id number] Date of Birth: April 01, 1941  [facility] of [facility]) CM/SW Contact  Joaquin Courts, RN Phone Number: 03/26/2019, 4:23 PM  Clinical Narrative:    CM received call from bedside RN stating patient needed portable O2 to d/c home. Patient could not remember her [place], CM spoke with daughter who stated it was [employer]. CM spoke with [employer] rep and arranged for portable O2 to be delivered to hospital, bedside RN to call [facility] once O2 has arrived to pick up patient.        Expected Discharge Plan and Services           Expected Discharge Date: 03/26/19                                     Social Determinants of Health (SDOH) Interventions    Readmission Risk Interventions No flowsheet data found.

## 2019-03-26 NOTE — Discharge Instructions (Signed)
Information on my medicine - XARELTO (rivaroxaban)  This medication education was reviewed with me or my healthcare representative as part of my discharge preparation.  The pharmacist that spoke with me during my hospital stay was:  [place] PRESCRIBED FOR YOU? Xarelto was prescribed to treat blood clots that may have been found in the veins of your legs (deep vein thrombosis) or in your lungs (pulmonary embolism) and to reduce the risk of them occurring again.  What do you need to know about Xarelto? The starting dose is one 15 mg tablet taken TWICE daily with food for the FIRST 21 DAYS then on 04/16/19  the dose is changed to one 20 mg tablet taken ONCE A DAY with your evening meal.  DO NOT stop taking Xarelto without talking to the health care provider who prescribed the medication.  Refill your prescription for 20 mg tablets before you run out.  After discharge, you should have regular check-up appointments with your healthcare provider that is prescribing your Xarelto.  In the future your dose may need to be changed if your kidney function changes by a significant amount.  What do you do if you miss a dose? If you are taking Xarelto TWICE DAILY and you miss a dose, take it as soon as you remember. You may take two 15 mg tablets (total 30 mg) at the same time then resume your regularly scheduled 15 mg twice daily the next day.  If you are taking Xarelto ONCE DAILY and you miss a dose, take it as soon as you remember on the same day then continue your regularly scheduled once daily regimen the next day. Do not take two doses of Xarelto at the same time.   Important Safety Information Xarelto is a blood thinner medicine that can cause bleeding. You should call your healthcare provider right away if you experience any of the following: ? Bleeding from an injury or your nose that does not stop. ? Unusual colored urine (red or dark brown) or unusual colored stools (red or  black). ? Unusual bruising for unknown reasons. ? A serious fall or if you hit your head (even if there is no bleeding).  Some medicines may interact with Xarelto and might increase your risk of bleeding while on Xarelto. To help avoid this, consult your healthcare provider or pharmacist prior to using any new prescription or non-prescription medications, including herbals, vitamins, non-steroidal anti-inflammatory drugs (NSAIDs) and supplements.  This website has more information on Xarelto: [email or web address]-benson.[email or web address]

## 2019-03-26 NOTE — Progress Notes (Addendum)
ANTICOAGULATION CONSULT NOTE - Follow Up Consult  Pharmacy Consult for Heparin  Indication: pulmonary embolus  Allergies  Allergen Reactions  . 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   Assessment: 25 y/oF with new PE. Pharmacy consulted for IV heparin dosing. Patient not on any anticoagulants PTA. Baseline labs WNL.  Significant Events:  -7/31: Thoracentesis.   Today, 03/26/19   Heparin level = 0.18 units/mL is subtherapeutic on heparin infusion of 1000 units/hr  Hgb 10.5 slightly decreased, slightly low  Patient underwent thoracentesis on 7/31, heparin was held for procedure but resumed @1745  on 7/31. Confirmed with RN that there are no issues with heparin infusion and there have been no interruptions in heparin overnight.   Per discussion with RN, no signs/symptoms of bleeding  Goal of Therapy:  Heparin level 0.3-0.7 units/ml Monitor platelets by anticoagulation protocol: Yes   Plan:   Heparin bolus of 2000 units  Increase heparin infusion to 1200 units/hr  Check HL in 8 hours  CBC daily  Monitor closely for s/sx of bleeding   Lenis Noon, PharmD 03/26/19 7:32 AM   Addendum: Pharmacy consulted to convert anticoagulation from heparin to rivaroxaban.   Plan: -Discontinue heparin drip at time of rivaroxaban initiation -Rivaroxaban 15 mg PO BID with food x 21 days followed by  rivaroxaban 20 mg PO daily -Patient provided with manufacturer coupon and education booklet -Medication counseling provided to patient as well as patient's sister -CBC with AM labs tomorrow -Continue to monitor for signs/symptoms of bleeding   Lenis Noon, PharmD 03/26/19 12:56 PM

## 2019-03-26 NOTE — TOC Progression Note (Signed)
Transition of Care [facility]) - Progression Note    Patient Details  Name: Peggy Welch MRN: [id number] Date of Birth: Apr 11, 1941  [facility] CM/SW Contact  Joaquin Courts, RN Phone Number: 03/26/2019, 1:29 PM  Clinical Narrative:    CM contacted to provide patient with 30 day card for xarelto. CM spoke with patient at bedside who already has a thirty day card that was provided to her. Patient is aware to use the card when she fills her prescription.          Expected Discharge Plan and Services           Expected Discharge Date: 03/26/19                                     Social Determinants of Health (SDOH) Interventions    Readmission Risk Interventions No flowsheet data found.

## 2019-03-26 NOTE — Discharge Summary (Signed)
Physician Discharge Summary  Peggy Welch BHA:[id number] DOB: Jun 12, 1941 DOA: 03/22/2019  PCP: Lucianne Lei, MD  Admit date: 03/22/2019 Discharge date: 03/26/2019  Time spent: 40 minutes  Recommendations for Outpatient Follow-up:  1. Continue to discuss goals of care outpatient, consider hospice with patient agreeable 2. Follow up with PCP or oncology per pt preference  3. Follow up pending cytology  4. Started on xarelto for PE, continue indefinitely given malignancy or until risk outweighs benefit or desire for comfort care  Discharge Diagnoses:  Principal Problem:   Hypercalcemia of malignancy Active Problems:   Hypokalemia   Breast cancer of lower-inner quadrant of right female breast ([place])   Pulmonary embolism ([place])   Dysphagia   Discharge Condition: stable  Diet recommendation: dysphagia 2 diet  Filed Weights   03/24/19 0423 03/25/19 0453 03/26/19 0437  Weight: 69.4 kg 70.6 kg 71 kg    History of present illness:  Peggy Welch 78 y.o.femalewith medical history significant forright-sided breast cancer with metastases to the lung and bone, hypertension, asthma, and hard of hearingwho presents to the ED for evaluation of several days of difficulty swallowing and cough. She has also had some shortness of breath. Patient has [name] known history of right-sided breast cancer with involvement of the lung and bones. Previously seen by oncology Dr. Ponciano Ort has not followed up in over Davis Ambrosini year. Per daughter, patient expressed wishes to avoid further treatment regarding her cancer. Patient otherwise denies any recent subjective fevers, diaphoresis, chest pain, abdominal pain, diarrhea, constipation, or dysuria.  ED Course: Initial vitals showed BP 135/117, pulse 95, RR 16, temp 99.2 Fahrenheit, SPO2 97% on room air.  Labs are notable for potassium 2.5, magnesium 2.0, sodium 134, chloride 89, bicarb 33, BUN 5, creatinine 0.64, calcium 13.6, albumin 4.1, phosphorus  2.0. WBC 5.8, hemoglobin 13.5, platelets 357,000.  SARS-CoV-2 test was obtained and pending.  Portable chest x-ray showed moderate bilateral pleural effusions with basilar atelectasis.  CTA chest PE study showed Chong January lobar/segmental PE within Laurell Coalson branch of the right lower lobe pulmonary artery with overall small clot burden or evidence of right heart strain. Mild interstitial edema and moderate to large bilateral pleural effusions are noted with dependent atelectasis in bilateral lower lobes. Abnormal soft tissue throughout the right breast extending to the right axilla and involving the right pectoralis musculature was noted with appearance concerning for progression of infiltrating tumor when compared to prior.  Patient was given 1 L normal saline, 40 mEq of oral potassium, IV K 10 mEq x 4 runs, and started on IV heparin drip. The hospitalist service was consulted to admit for further evaluation and management.  She was admitted for PE and hypercalcemia related to metastatic breast cancer.  She received anticoagulation and was discharged on xarelto.  She received IVF and zometa and hypercalcemia improved.  She had bilateral thoracentesis prior to d/c.  Palliative care was consulted and pt was not interested in further workup or treatment for her cancer, she wants to return home and enjoy time with her family.  Hospice was offered, but pt declined, noting she had enough family support at home.  Family was provided information on how to contact hospice if need.  See below for further details Hospital Course:  Peggy Welch 78 y.o.femalewith medical history significant forright-sided breast cancer with metastases to the lung and bone, hypertension, and asthma whois admitted with hypercalcemia.  Hypercalcemia: Moderate hypercalcemia 13.6 on admission likely secondary to malignancy in addition to HCTZ use  Improved since admission after IVF and  zometa  Hypokalemia/hypophosphatemia: Improved, replace and follow  Lobar/segmental PE within branch of the right lower lobe pulmonary artery: Likely due to malignancy. Has been started on IV heparin.  Follow LE [place] bilaterally (negative for DVT) Oncology recommending lovenox vs xarelto at d/c - discharged on xarelto (discussed with pt pharmacy, cost less than 4$)  Given 30 day free card at d/c as well.  Right breast cancer with metastases to lung and bonesand RUE lymphedema: CT scan shows changes concerning for progression of infiltrating tumor. Patient was previously followed by oncology, Dr. Ponciano Ort has not followed up in over Kaelee Pfeffer year. Per discussion with daughter, patient has expressed to her that she did not want continued therapy for cancer and that is why she stopped following with oncology. - appreciate oncology recommendations -> no further imaging or w/u for cancer recommended at this time in light of pt declining any cancer treatment  - palliative care c/s -> pt has repeatedly declined hospice, though her daughter and sister seem open to it.  Family given information on how to contact hospice and palliative care when needed.  Bilateral Exudative pleural effusions: Suspect due to malignancy. She is currently saturating well on room air does not appear to have respiratory distress.  S/p R thoracentesis on 7/30 - follow pending labs - exudative, pending cytology, negative cx to date S/p L thoracentesis on 7/31 - exudative, pending cytology, pending cx Follow outpatient   Dysphagia  Poor appetite: Reported recent difficulty with swallowing and coughing. Possiblyrelated to tumor progression. Have ordered SLP/swallow evaluation. Recommending dysphagia 2, thin liquid Pt c/o poor appetite and asking for something for appetite - start remeron  Hypertension: Hypertensive on admission. Holding home HCTZ as above. Use IV hydralazine as needed.  Asthma: No active  wheezing. Do not think presenting symptoms are related to asthma exacerbation. Can continue albuterol as needed.  Procedures: IR thoracentesis x2   Consultations:  IR  oncology  Discharge Exam: Vitals:   03/26/19 0437 03/26/19 1508  BP: 109/68 123/64  Pulse: 91 96  Resp: 20 19  Temp: 99.3 F (37.4 C) 99.2 F (37.3 C)  SpO2: 98% 95%   Ready to go home Not interested in hospice Discussed with daughter and sister. Discussed d/c plan  General: No acute distress. Cardiovascular: Heart sounds show Jaray Boliver regular rate, and rhythm.  Lungs: Clear to auscultation bilaterally  Abdomen: Soft, nontender, nondistended  Neurological: Alert and oriented 3. Moves all extremities 4. Cranial nerves II through XII grossly intact. Skin: Warm and dry. No rashes or lesions. Extremities: No clubbing or cyanosis. No edema  Discharge Instructions   Discharge Instructions    Call MD for:  difficulty breathing, headache or visual disturbances   Complete by: As directed    Call MD for:  extreme fatigue   Complete by: As directed    Call MD for:  persistant dizziness or light-headedness   Complete by: As directed    Call MD for:  persistant nausea and vomiting   Complete by: As directed    Call MD for:  redness, tenderness, or signs of infection (pain, swelling, redness, odor or green/yellow discharge around incision site)   Complete by: As directed    Call MD for:  severe uncontrolled pain   Complete by: As directed    Call MD for:  temperature >100.4   Complete by: As directed    Diet - low sodium heart healthy   Complete by: As directed  Discharge instructions   Complete by: As directed    You were seen for metastatic breast cancer.  We did thoracentesis (taking fluid off the lung) on the left and right side.  This was likely related to the cancer, but labs are still pending.  Please follow up with your PCP regarding the final labs.  Hopefully the thoracentesis helps with your  shortness of breath.  You had Tylor Gambrill blood clot in your lungs, likely due to the cancer.  We treated you with Laiah Pouncey blood thinner here in the hospital and will discharge you with xarelto.  Stop aspirin and do not take ibuprofen, naproxen, or other NSAIDs as this will increase your risk of bleeding.  You also had high calcium levels due to your cancer.  This has now improved after treatment (with IV fluids and Michell Giuliano medication called Zometa).   You were seen by speech who placed you on Naydelin Ziegler dysphagia 2 diet.  I started you on remeron for your appetite.   You were seen by palliative care, but have declined hospice to come to the home at this point in time.  If you become interested, call [phone] to reach [facility].  You can also visit the [facility] for additional resources.    Return for new, recurrent, or worsening symptoms.  Please ask your PCP to request records from this hospitalization so they know what was done and what the next steps will be.   Increase activity slowly   Complete by: As directed      Allergies as of 03/26/2019      Reactions   Shrimp [shellfish Allergy] Anaphylaxis   Swelling in throat      Medication List    STOP taking these medications   aspirin 81 MG tablet   Aspirin-Calcium Carbonate 81-777 MG Tabs   hydrochlorothiazide 25 MG tablet Commonly known as: HYDRODIURIL   naproxen 250 MG tablet Commonly known as: NAPROSYN     TAKE these medications   acetaminophen 650 MG CR tablet Commonly known as: TYLENOL Take 650 mg by mouth every 8 (eight) hours as needed for pain (pain).   albuterol (2.5 MG/3ML) 0.083% nebulizer solution Commonly known as: PROVENTIL Take 3 mLs (2.5 mg total) by nebulization every 4 (four) hours as needed for wheezing or shortness of breath (wheezing).   anastrozole 1 MG tablet Commonly known as: ARIMIDEX TAKE 1 TABLET(1 MG) BY MOUTH DAILY   B-complex with vitamin C tablet Take 1 tablet by mouth daily.    citalopram 10 MG tablet Commonly known as: CELEXA Take 10 mg by mouth every morning.   mirtazapine 7.5 MG tablet Commonly known as: REMERON Take 1 tablet (7.5 mg total) by mouth at bedtime.   multivitamin with minerals Tabs tablet Take 1 tablet by mouth daily. Start taking on: March 27, 2019   Potassium Chloride ER 20 MEQ Tbcr Take 20 mEq by mouth daily.   Rivaroxaban 15 & 20 MG Tbpk Take as directed on package: Start with one 15mg  tablet by mouth twice Lexa Coronado day with food. On Day 22, switch to one 20mg  tablet once Felicia Both day with food.            Durable Medical Equipment  (From admission, onward)         Start     Ordered   03/26/19 1718  DME Oxygen  Once    Comments: SATURATION QUALIFICATIONS: (This note is used to comply with regulatory documentation for home oxygen)  Patient Saturations on Room Air at Rest = 91%  Patient Saturations on [employer] while Ambulating = 83%  Patient Saturations on 2 Liters of oxygen while Ambulating = 93%  Please briefly explain why patient needs home oxygen:  Desaturation with activity  Please evaluate for portable light weight concentrator  Question Answer Comment  Length of Need Lifetime   Mode or (Route) Nasal cannula   Liters per Minute 2   Frequency Continuous (stationary and portable oxygen unit needed)   Oxygen delivery system Gas      03/26/19 1717         Allergies  Allergen Reactions  . 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    Swelling in throat      The results of significant diagnostics from this hospitalization (including imaging, microbiology, ancillary and laboratory) are listed below for reference.    Significant Diagnostic Studies: Dg Chest 1 View  Result Date: 03/25/2019 CLINICAL DATA:  78 year old female with Jayvin Hurrell history of thoracentesis EXAM: CHEST  1 VIEW COMPARISON:  March 24, 2019, March 23, 2019 FINDINGS: Cardiomediastinal silhouette likely unchanged in size and contour. Decreased opacity at the left  lung base with no visualized pneumothorax, and improved aeration. Blunting at the right costophrenic angle persists. Degenerative changes of the shoulders. Hazy opacity of the bilateral lower lungs. IMPRESSION: Decreased left-sided pleural fluid with no visualized pneumothorax. Persisting opacities at the bilateral lung base, including likely small trace right pleural effusion. Electronically Signed   By: Corrie Mckusick D.O.   On: 03/25/2019 15:48   Dg Chest 1 View  Result Date: 03/24/2019 CLINICAL DATA:  Status post right thoracentesis EXAM: CHEST  1 VIEW COMPARISON:  March 22, 2019 FINDINGS: There is interval resolution of right-sided pleural effusion. No pneumothorax. Streaky atelectasis seen at the right lung base. Again noted is Rishon Thilges moderate left pleural effusion. The cardiomediastinal silhouette is unchanged with aortic knob calcifications. Degenerative changes seen at both shoulders. IMPRESSION: 1. No right-sided pneumothorax 2. Moderate left pleural effusion. Electronically Signed   By: Prudencio Pair M.D.   On: 03/24/2019 15:29   Dg Chest 2 View  Result Date: 03/22/2019 CLINICAL DATA:  78 y.o female sent over from PCP for difficulty swallowing, productive coughing, and low 02. 95% RA in triage. Denies chest pain or shob. Hx. Asthma, HTN, PNA, and rt breast cancer EXAM: CHEST - 2 VIEW COMPARISON:  Chest CT, 12/14/2017. Chest radiographs, 11/30/2014 and earlier. FINDINGS: Moderate bilateral pleural effusions obscure the hemidiaphragms and most of the heart borders. There is additional lung base opacity consistent with atelectasis. Remainder of the lungs is clear. No pneumothorax. Cardiac silhouette is top-normal in size. No mediastinal or hilar masses. Skeletal structures are intact. IMPRESSION: 1. Moderate bilateral pleural effusions with associated basilar atelectasis. Cannot exclude underlying pneumonia. No evidence of pulmonary edema. Electronically Signed   By: Lajean Manes M.D.   On: 03/22/2019 17:42    Ct Angio Chest Pe W And/or Wo Contrast  Result Date: 03/22/2019 CLINICAL DATA:  Difficulty swallowing, productive cough, hypoxia. History of breast cancer. EXAM: CT ANGIOGRAPHY CHEST WITH CONTRAST TECHNIQUE: Multidetector CT imaging of the chest was performed using the standard protocol during bolus administration of intravenous contrast. Multiplanar CT image reconstructions and MIPs were obtained to evaluate the vascular anatomy. CONTRAST:  151mL OMNIPAQUE IOHEXOL 350 MG/ML SOLN COMPARISON:  Chest radiographs dated 03/22/2019. CT chest dated 12/14/2017. FINDINGS: Cardiovascular: Satisfactory opacification of the bilateral pulmonary arteries to the lobar level. Lobar/segmental pulmonary embolism within Wallace Cogliano branch of the right lower  lobe pulmonary artery (series 8/image 117). Overall clot burden is small. No evidence of right heart strain. No evidence of thoracic aortic aneurysm or dissection. Mild atherosclerotic calcifications of the aortic arch. The heart is normal in size.  Trace pericardial fluid. Mediastinum/Nodes: No suspicious mediastinal lymphadenopathy. Visualized thyroid is unremarkable. Lungs/Pleura: Evaluation of the lung parenchyma is constrained by respiratory motion. Within that constraint, there are no suspicious pulmonary nodules. Suspected mild perihilar edema in the bilateral upper lobes. Moderate to large bilateral pleural effusions. Associated areas of scattered atelectasis in the bilateral upper lobes with dependent atelectasis in the bilateral lower lobes. No pneumothorax. Upper Abdomen: Visualized upper abdomen is notable for prior cholecystectomy. Musculoskeletal: Abnormal soft tissue throughout the right breast and extending to the right axilla with involvement of the right pectoralis musculature (series 7/image 27), measuring approximately 4.5 x 14.5 cm in axial dimension. While some of this appearance may reflect radiation change, the overall appearance is considered concerning for  infiltrating tumor which has progressed from the prior. Overlying skin thickening. Degenerative changes of the visualized thoracolumbar spine. Review of the MIP images confirms the above findings. IMPRESSION: Lobar/segmental pulmonary embolism within [place] branch of the right lower lobe pulmonary artery. Overall clot burden is small. No evidence of right heart strain. Mild interstitial edema. Moderate to large bilateral pleural effusions. Dependent atelectasis in the bilateral lower lobes. Abnormal soft tissue throughout the right breast, extending to the right axilla, and involving the right pectoralis musculature. This appearance is concerning for infiltrating tumor which has progressed from the prior. Overlying skin thickening. These results were called by telephone at the time of interpretation on 03/22/2019 at 8:48 pm to Dr. Gareth Morgan, who verbally acknowledged these results. Aortic Atherosclerosis (ICD10-I70.0). Electronically Signed   By: Julian Hy M.D.   On: 03/22/2019 20:49   Dg Swallowing Func-speech Pathology  Result Date: 03/23/2019 Objective Swallowing Evaluation: Type of Study: MBS-Modified Barium Swallow Study  Patient Details Name: THEODORA LALANNE MRN: [id number] Date of Birth: 1941/04/27 Today's Date: 03/23/2019 Time: SLP Start Time (ACUTE ONLY): 0810 -SLP Stop Time (ACUTE ONLY): 0840 SLP Time Calculation (min) (ACUTE ONLY): 30 min Past Medical History: Past Medical History: Diagnosis Date . Arthritis  . Asthma  . Asthma exacerbation 10/14/2011 . Blood transfusion without reported diagnosis  . Hepatitis C   from blood transfusion . Hypertension  . Pneumonia 4/02016 . rt breast ca dx'd 2019 Past Surgical History: Past Surgical History: Procedure Laterality Date . CHOLECYSTECTOMY  02/21/2002 . JOINT REPLACEMENT    Bilateral total knee replacements . knee replacements Bilateral  . Right Rotator Cuff Repair  08/12/2006 . TOTAL HIP ARTHROPLASTY Right 11/22/2014  dr Lorin Mercy . TOTAL HIP ARTHROPLASTY  Right 11/22/2014  Procedure: TOTAL HIP ARTHROPLASTY ANTERIOR APPROACH;  Surgeon: Marybelle Killings, MD;  Location: [facility];  Service: Orthopedics;  Laterality: Right; HPI: pt is Yash Cacciola 78 yo female with h/o breast cancer admitted [facility] with dysphagia.  Pt found to have hypoxia also.  MBS ordered.  Pt reports dysphagia clinically with symptoms including coughing on water immediately after swallowing.  Pt CT chest showed "Abnormal soft tissue throughout the right breast and extending to the right axilla with involvement of the right pectoralis musculature (series 7/image 27), measuring approximately 4.5 x 14.5 cm in axial dimension. While some of this appearance may reflect radiation change, the overall appearance is considered concerning for infiltrating tumor which has progressed from prior".  Pt is also positive for pulmonary embolism.  She also has bilateral pleural  effusions.  Per MD notes, pt has not followed oncology for one year and does not want further treatment.  Subjective: pt awake in chair, very HOH Assessment / Plan / Recommendation CHL IP CLINICAL IMPRESSIONS 03/23/2019 Clinical Impression Patient presents with mild pharyngeal dysphagia with motor impairments but no aspiration.  Pt's dysphagia marked by decreased timing of laryngeal closure which allows consistent laryngeal penetration of thin liquids and pharyngeal retention clearance.  She is however protective of her airway and independently conducts dry swallows with breath hold to clear larynx/pharynx of liquid residual.  Indequate epiglottic deflection negatively impacts solids/puree pharyngeal clearance although pt is sensate.  Reflexive dry swallow decreased solid/puree residuals and liquid "wash" helpful to clear pharynx.  Postures attempted (head turn right and chin tuck) although neck ROM very limited thus not adequately performed nor were they helpful to prevent residuals or decrease amount of laryngeal penetration.  Pt with increased aspiration risk  with liquid using chin tuck as it contributes to spillage of residuals from pyriform sinus into larynx.  Pt is compensating well for her dysphagia without airway infiltration.  Recommend pt maximize her liquid nutritional intake as liquids clear pharynx easier than solids.  Pt reports she has been drinking liquid supplements at home.  Recommend dys3/thin diet with strict aspiration precautions.  Given pt reports xerostomia, recommend start all po with liquids.  Question if pt's tumor progression may be impacting her pharyngeal and esophageal swallowing ability.  Will follow up for pt education to compensation strategies.  Note per review of chart, she did not want further treatment of her breast cancer thus compensation for dysphagia is likely optimal route for her goals.Of note, pt did cough x1 during MBS but there was not barium visualized in her trachea. SLP Visit Diagnosis Dysphagia, pharyngoesophageal phase (R13.14) Attention and concentration deficit following -- Frontal lobe and executive function deficit following -- Impact on safety and function Mild aspiration risk   CHL IP TREATMENT RECOMMENDATION 12/04/2014 Treatment Recommendations No treatment recommended at this time   No flowsheet data found. CHL IP DIET RECOMMENDATION 03/23/2019 SLP Diet Recommendations Dysphagia 3 (Mech soft) solids;Thin liquid Liquid Administration via Cup;Straw Medication Administration Crushed with puree Compensations Slow rate;Small sips/bites;Multiple dry swallows after each bite/sip;Follow solids with liquid Postural Changes Remain semi-upright after after feeds/meals (Comment);Seated upright at 90 degrees   CHL IP OTHER RECOMMENDATIONS 03/23/2019 Recommended Consults -- Oral Care Recommendations Oral care BID Other Recommendations --   CHL IP FOLLOW UP RECOMMENDATIONS 12/04/2014 Follow up Recommendations None   CHL IP FREQUENCY AND DURATION 03/23/2019 Speech Therapy Frequency (ACUTE ONLY) min 1 x/week Treatment Duration 1 week       CHL IP ORAL PHASE 03/23/2019 Oral Phase Impaired Oral - Pudding Teaspoon -- Oral - Pudding Cup -- Oral - Honey Teaspoon -- Oral - Honey Cup -- Oral - Nectar Teaspoon -- Oral - Nectar Cup WFL;Piecemeal swallowing Oral - Nectar Straw -- Oral - Thin Teaspoon WFL;Piecemeal swallowing Oral - Thin Cup [facility];Piecemeal swallowing Oral - Thin Straw Piecemeal swallowing;WFL Oral - Puree Lingual pumping Oral - Mech Soft WFL Oral - Regular -- Oral - Multi-Consistency -- Oral - Pill -- Oral Phase - Comment --  CHL IP PHARYNGEAL PHASE 03/23/2019 Pharyngeal Phase -- Pharyngeal- Pudding Teaspoon -- Pharyngeal -- Pharyngeal- Pudding Cup -- Pharyngeal -- Pharyngeal- Honey Teaspoon -- Pharyngeal -- Pharyngeal- Honey Cup -- Pharyngeal -- Pharyngeal- Nectar Teaspoon -- Pharyngeal -- Pharyngeal- Nectar Cup -- Pharyngeal -- Pharyngeal- Nectar Straw -- Pharyngeal -- Pharyngeal- Thin Teaspoon WFL Pharyngeal  Material does not enter airway Pharyngeal- Thin Cup Reduced airway/laryngeal closure;Penetration/Aspiration during swallow;Penetration/Aspiration before swallow;Pharyngeal residue - pyriform;Pharyngeal residue - valleculae Pharyngeal Material enters airway, remains ABOVE vocal cords then ejected out Pharyngeal- Thin Straw Reduced airway/laryngeal closure;Pharyngeal residue - valleculae;Pharyngeal residue - pyriform Pharyngeal Material enters airway, remains ABOVE vocal cords then ejected out Pharyngeal- Puree Reduced epiglottic inversion;Reduced tongue base retraction;Pharyngeal residue - valleculae;Pharyngeal residue - pyriform Pharyngeal Material does not enter airway Pharyngeal- Mechanical Soft Reduced tongue base retraction;Reduced epiglottic inversion;Pharyngeal residue - valleculae;Pharyngeal residue - pyriform Pharyngeal Material does not enter airway Pharyngeal- Regular -- Pharyngeal -- Pharyngeal- Multi-consistency -- Pharyngeal -- Pharyngeal- Pill -- Pharyngeal -- Pharyngeal Comment pt with minimal neck ROM and thus chin tuck nor  head turn right were nof performed adequately and they were not helpful in preventing laryngeal penetration or decreasing pharyngeal stasis; reflexive dry swallows with breath hold with liquids protective of her airway and liquid swallow post-swallow of food decreased pharyngeal residuals  CHL IP CERVICAL ESOPHAGEAL PHASE 03/23/2019 Cervical Esophageal Phase Impaired Pudding Teaspoon -- Pudding Cup -- Honey Teaspoon -- Honey Cup -- Nectar Teaspoon -- Nectar Cup -- Nectar Straw -- Thin Teaspoon -- Thin Cup -- Thin Straw -- Puree -- Mechanical Soft -- Regular -- Multi-consistency -- Pill -- Cervical Esophageal Comment With puree, pt appeared with halting below CP but this did clear with liquids.  Upon esophageal sweep, pt appeared with minimal esophagus - suspect WFL.  Radiologist not present to confirm findings. Luanna Salk, MS [facility] SLP Acute Rehab Services Pager [phone] Office [phone] Macario Golds 03/23/2019, 11:08 AM              Vas [place] Lower Extremity Venous (dvt)  Result Date: 03/25/2019  Lower Venous Study Indications: Pulmonary embolism.  Comparison Study: no prior Performing Technologist: Abram Sander RVS  Examination Guidelines: Durwin Davisson complete evaluation includes B-mode imaging, spectral Doppler, color Doppler, and power Doppler as needed of all accessible portions of each vessel. Bilateral testing is considered an integral part of Jordon Bourquin complete examination. Limited examinations for reoccurring indications may be performed as noted.  +---------+---------------+---------+-----------+----------+-------+ RIGHT    CompressibilityPhasicitySpontaneityPropertiesSummary +---------+---------------+---------+-----------+----------+-------+ CFV      Full           Yes      Yes                          +---------+---------------+---------+-----------+----------+-------+ SFJ      Full                                                  +---------+---------------+---------+-----------+----------+-------+ FV Prox  Full                                                 +---------+---------------+---------+-----------+----------+-------+ FV Mid   Full                                                 +---------+---------------+---------+-----------+----------+-------+ FV DistalFull                                                 +---------+---------------+---------+-----------+----------+-------+  PFV      Full                                                 +---------+---------------+---------+-----------+----------+-------+ POP      Full           Yes      Yes                          +---------+---------------+---------+-----------+----------+-------+ PTV      Full                                                 +---------+---------------+---------+-----------+----------+-------+ PERO     Full                                                 +---------+---------------+---------+-----------+----------+-------+   +---------+---------------+---------+-----------+----------+--------------+ LEFT     CompressibilityPhasicitySpontaneityPropertiesSummary        +---------+---------------+---------+-----------+----------+--------------+ CFV      Full           Yes      Yes                                 +---------+---------------+---------+-----------+----------+--------------+ SFJ      Full                                                        +---------+---------------+---------+-----------+----------+--------------+ FV Prox  Full                                                        +---------+---------------+---------+-----------+----------+--------------+ FV Mid   Full                                                        +---------+---------------+---------+-----------+----------+--------------+ FV DistalFull                                                         +---------+---------------+---------+-----------+----------+--------------+ PFV      Full                                                        +---------+---------------+---------+-----------+----------+--------------+ POP  Full           Yes      Yes                                 +---------+---------------+---------+-----------+----------+--------------+ PTV      Full                                                        +---------+---------------+---------+-----------+----------+--------------+ PERO                                                  Not visualized +---------+---------------+---------+-----------+----------+--------------+     Summary: Right: There is no evidence of deep vein thrombosis in the lower extremity. Left: No evidence of deep vein thrombosis in the lower extremity.  *See table(s) above for measurements and observations. Electronically signed by Curt Jews MD on 03/25/2019 at 1:51:23 PM.    Final    US Thoracentesis Asp Pleural Space W/img Guide  Result Date: 03/25/2019 INDICATION: Patient with history of metastatic breast cancer, dyspnea, bilateral pleural effusions. Status post right thoracentesis 03/24/2019; now request received for diagnostic and therapeutic left thoracentesis. EXAM: ULTRASOUND GUIDED DIAGNOSTIC AND THERAPEUTIC LEFT THORACENTESIS MEDICATIONS: None COMPLICATIONS: None immediate. PROCEDURE: An ultrasound guided thoracentesis was thoroughly discussed with the patient and questions answered. The benefits, risks, alternatives and complications were also discussed. The patient understands and wishes to proceed with the procedure. Written consent was obtained. Ultrasound was performed to localize and mark an adequate pocket of fluid in the left chest. The area was then prepped and draped in the normal sterile fashion. 1% Lidocaine was used for local anesthesia. Under ultrasound guidance Female Minish 6 Fr Safe-T-Centesis catheter was introduced.  Thoracentesis was performed. The catheter was removed and Bodi Palmeri dressing applied. FINDINGS: Raimundo Corbit total of approximately 1.4 liters of yellow fluid was removed. Samples were sent to the laboratory as requested by the clinical team. IMPRESSION: Successful ultrasound guided diagnostic and therapeutic left thoracentesis yielding 1.4 liters of pleural fluid. Read by: Rowe Robert, PA-C Electronically Signed   By: Markus Daft M.D.   On: 03/25/2019 17:27   US Thoracentesis Asp Pleural Space W/img Guide  Result Date: 03/24/2019 INDICATION: Patient with history of metastatic breast cancer, dyspnea, and bilateral pleural effusions. Request is made for diagnostic and therapeutic right thoracentesis. EXAM: ULTRASOUND GUIDED DIAGNOSTIC AND THERAPEUTIC RIGHT THORACENTESIS MEDICATIONS: 10 mL 1% lidocaine COMPLICATIONS: None immediate. PROCEDURE: An ultrasound guided thoracentesis was thoroughly discussed with the patient and questions answered. The benefits, risks, alternatives and complications were also discussed. The patient understands and wishes to proceed with the procedure. Written consent was obtained. Ultrasound was performed to localize and mark an adequate pocket of fluid in the right chest. The area was then prepped and draped in the normal sterile fashion. 1% Lidocaine was used for local anesthesia. Under ultrasound guidance Jermiah Soderman 6 Fr Safe-T-Centesis catheter was introduced. Thoracentesis was performed. The catheter was removed and Lichelle Viets dressing applied. FINDINGS: Torres Hardenbrook total of approximately 850 mL of hazy gold fluid was removed. Samples were sent to the laboratory as requested by the clinical team. IMPRESSION: Successful ultrasound guided right thoracentesis yielding  850 mL of pleural fluid. Read by: Earley Abide, PA-C No pneumothorax on follow-up radiograph. Electronically Signed   By: Lucrezia Europe M.D.   On: 03/24/2019 15:34    Microbiology: Recent Results (from the past 240 hour(s))  SARS Coronavirus 2 (CEPHEID -  Performed in [place] hospital lab), Hosp Order     Status: None   Collection Time: 03/22/19  5:57 PM   Specimen: Nasopharyngeal Swab  Result Value Ref Range Status   SARS Coronavirus 2 NEGATIVE NEGATIVE Final    Comment: (NOTE) If result is NEGATIVE SARS-CoV-2 target nucleic acids are NOT DETECTED. The SARS-CoV-2 RNA is generally detectable in upper and lower  respiratory specimens during the acute phase of infection. The lowest  concentration of SARS-CoV-2 viral copies this assay can detect is 250  copies / mL. Chez Bulnes negative result does not preclude SARS-CoV-2 infection  and should not be used as the sole basis for treatment or other  patient management decisions.  Jessamyn Watterson negative result may occur with  improper specimen collection / handling, submission of specimen other  than nasopharyngeal swab, presence of viral mutation(s) within the  areas targeted by this assay, and inadequate number of viral copies  (<250 copies / mL). Chibueze Beasley negative result must be combined with clinical  observations, patient history, and epidemiological information. If result is POSITIVE SARS-CoV-2 target nucleic acids are DETECTED. The SARS-CoV-2 RNA is generally detectable in upper and lower  respiratory specimens dur ing the acute phase of infection.  Positive  results are indicative of active infection with SARS-CoV-2.  Clinical  correlation with patient history and other diagnostic information is  necessary to determine patient infection status.  Positive results do  not rule out bacterial infection or co-infection with other viruses. If result is PRESUMPTIVE POSTIVE SARS-CoV-2 nucleic acids MAY BE PRESENT.   Adaiah Morken presumptive positive result was obtained on the submitted specimen  and confirmed on repeat testing.  While 2019 novel coronavirus  (SARS-CoV-2) nucleic acids may be present in the submitted sample  additional confirmatory testing may be necessary for epidemiological  and / or clinical management  purposes  to differentiate between  SARS-CoV-2 and other Sarbecovirus currently known to infect humans.  If clinically indicated additional testing with an alternate test  methodology [phone]) is advised. The SARS-CoV-2 RNA is generally  detectable in upper and lower respiratory sp ecimens during the acute  phase of infection. The expected result is Negative. Fact Sheet for Patients:  StrictlyIdeas.no Fact Sheet for Healthcare Providers: BankingDealers.co.za This test is not yet approved or cleared by the [place] FDA and has been authorized for detection and/or diagnosis of SARS-CoV-2 by FDA under an Emergency Use Authorization (EUA).  This EUA will remain in effect (meaning this test can be used) for the duration of the COVID-19 declaration under Section 564(b)(1) of the Act, 21 U.S.C. section 360bbb-3(b)(1), unless the authorization is terminated or revoked sooner. Performed at [facility], [place] [address], [place], [place] [address]   Body fluid culture     Status: None (Preliminary result)   Collection Time: 03/24/19  3:20 PM   Specimen: Pleural, Right; Pleural Fluid  Result Value Ref Range Status   Specimen Description   Final    PLEURAL RIGHT Performed at [facility] [address], [place], [place] [address]    Special Requests   Final    NONE Performed at [facility], [place] [address], [place], [place] [address]    Gram  Stain   Final    MODERATE WBC PRESENT, PREDOMINANTLY MONONUCLEAR NO ORGANISMS SEEN    Culture   Final    NO GROWTH 2 DAYS Performed at [facility] [address] [place], [place] [address]    Report Status PENDING  Incomplete  Body fluid culture     Status: None (Preliminary result)   Collection Time: 03/25/19  3:19 PM   Specimen: Pleural, Left; Pleural Fluid  Result Value Ref Range Status   Specimen Description   Final     PLEURAL LEFT Performed at [facility] [address], [place], [place] [address]    Special Requests   Final    NONE Performed at [facility], [place] [address], [place], [place] [address]    Gram Stain   Final    RARE WBC PRESENT, PREDOMINANTLY MONONUCLEAR NO ORGANISMS SEEN    Culture   Final    NO GROWTH < 24 HOURS Performed at [facility], [place] [address], [place], [place] [address]    Report Status PENDING  Incomplete     Labs: Basic Metabolic Panel: Recent Labs  Lab 03/22/19 1831 03/23/19 0755 03/24/19 0432 03/25/19 0407 03/26/19 0528  NA 134* 138 138 139 139  K 2.5* 3.0* 3.0* 3.3* 3.5  CL 89* 100 101 104 105  CO2 33* 28 29 26 28   GLUCOSE 101* 87 90 94 92  BUN 5* 5* 7* 8 9  CREATININE 0.64 0.55 0.60 0.62 0.59  CALCIUM 13.6* 11.6* 11.8* 11.1* 9.7  MG 2.0  --  1.7 1.8 1.5*  PHOS 2.0* 2.5 2.2* 2.8  --    Liver Function Tests: Recent Labs  Lab 03/22/19 1831 03/23/19 0755 03/24/19 0432 03/25/19 0407 03/26/19 0528  AST 59*  --  39 35 26  ALT 30  --  21 19 14   ALKPHOS 71  --  48 44 37*  BILITOT 1.3*  --  1.1 1.0 1.3*  PROT 7.9  --  6.4* 5.8* 5.2*  ALBUMIN 4.1 3.5 3.2* 2.9* 2.5*   No results for input(s): LIPASE, AMYLASE in the last 168 hours. No results for input(s): AMMONIA in the last 168 hours. CBC: Recent Labs  Lab 03/22/19 1831 03/23/19 0755 03/24/19 0432 03/25/19 0407 03/26/19 0528  WBC 5.8 6.2 5.6 5.9 9.2  NEUTROABS 3.3  --   --   --   --   HGB 13.5 12.3 11.3* 11.4* 10.5*  HCT 42.3 38.4 36.7 38.2 34.7*  MCV 94.6 96.7 97.9 100.8* 99.7  PLT 357 343 337 301 284   Cardiac Enzymes: No results for input(s): CKTOTAL, CKMB, CKMBINDEX, TROPONINI in the last 168 hours. BNP: BNP (last 3 results) No results for input(s): BNP in the last 8760 hours.  ProBNP (last 3 results) No results for input(s): PROBNP in the last 8760 hours.  CBG: No results for input(s): GLUCAP in the last 168  hours.     Signed:  Fayrene Helper MD.  [facility] 03/26/2019, 8:45 PM

## 2019-03-26 NOTE — Progress Notes (Signed)
AVS given to patient and explained at the bedside. Medications and follow up appointments have been explained with pt verbalizing understanding.  

## 2019-03-26 NOTE — Progress Notes (Signed)
SATURATION QUALIFICATIONS: (This note is used to comply with regulatory documentation for home oxygen)  Patient Saturations on Room Air at Rest = 91%  Patient Saturations on Room Air while Ambulating = 83%  Patient Saturations on 2 Liters of oxygen while Ambulating = 93%  Please briefly explain why patient needs home oxygen: dyspnea on exertion

## 2019-03-26 NOTE — Progress Notes (Signed)
Pt had a brief run of SVT at 0930 and 1330 this shift. HR going up to the 170's during these events. PT remains asymptomatic during these episodes and returns to a HR in the 80's within 30 seconds. MD notified. Will continue to monitor

## 2019-03-27 ENCOUNTER — Other Ambulatory Visit: Payer: Self-pay | Admitting: Hematology

## 2019-03-28 LAB — BODY FLUID CULTURE: Culture: NO GROWTH

## 2019-03-28 LAB — PH, BODY FLUID
pH, Body Fluid: 7.5
pH, Body Fluid: 7.6

## 2019-03-29 LAB — BODY FLUID CULTURE: Culture: NO GROWTH

## 2019-03-30 DIAGNOSIS — J45909 Unspecified asthma, uncomplicated: Secondary | ICD-10-CM | POA: Diagnosis not present

## 2019-03-31 DIAGNOSIS — J449 Chronic obstructive pulmonary disease, unspecified: Secondary | ICD-10-CM | POA: Diagnosis not present

## 2019-04-01 ENCOUNTER — Telehealth: Payer: Self-pay

## 2019-04-01 DIAGNOSIS — H902 Conductive hearing loss, unspecified: Secondary | ICD-10-CM | POA: Diagnosis not present

## 2019-04-01 DIAGNOSIS — S20111A Abrasion of breast, right breast, initial encounter: Secondary | ICD-10-CM | POA: Diagnosis not present

## 2019-04-01 DIAGNOSIS — C50112 Malignant neoplasm of central portion of left female breast: Secondary | ICD-10-CM | POA: Diagnosis not present

## 2019-04-01 DIAGNOSIS — I10 Essential (primary) hypertension: Secondary | ICD-10-CM | POA: Diagnosis not present

## 2019-04-01 NOTE — Telephone Encounter (Signed)
Patient/patient's daughter calls for an appointment.  Scheduled for Thursday 8/13 at 9:15.  She is aware.

## 2019-04-04 NOTE — Progress Notes (Signed)
[place]   [phone] [phone]   Clinic Follow up Note   Patient Care Team: Lucianne Lei, MD as PCP - General (Family Medicine) Rockwell Germany, RN as Registered Nurse Mauro Kaufmann, RN as Registered Nurse Kyung Rudd, MD as Consulting Physician (Radiation Oncology) Coralie Keens, MD as Consulting Physician (General Surgery)  Date of Service:  04/07/2019  CHIEF COMPLAINT: Follow up metastatic right breast cancer and RLL PE  SUMMARY OF ONCOLOGIC HISTORY: Oncology History Overview Note  Cancer Staging Breast cancer of lower-inner quadrant of right female breast [facility]) Staging form: Breast, AJCC 7th Edition - Clinical stage from 03/20/2015: Stage IIB (T2, N1, M0) - Unsigned     Breast cancer of lower-inner quadrant of right female breast ([place])  02/27/2015 Receptors her2   ER 95%+, PR-, HER2 (-), KI67 5%    02/27/2015 Initial Biopsy   right breast mass and axillary node biopsy showed invasive lobular carcinoma, (+) LCIS    03/05/2015 Initial Diagnosis   Breast cancer of lower-inner quadrant of right female breast   03/08/2015 Imaging   Breast MRI showed:  Spiculated biopsy proven malignancy in the lower inner R breast  2.1 x 2.5 x 2.3 cm,  6 mm oval mass in the R breast 9 o'clock position, Oval ehancing nodules (5-6 mm) in the upper-outer R breast, and known enlarged LN, left breast (-)   12/01/2017 Imaging   Breast US 12/01/17 IMPRESSION 1. Worsening invasive lobular carcinoma of the right breast with worsening metastatic axillary adenopathy. In addition. Physical exam findings consistent with skin invasion, and concerning for possible invasion of the sternum.  2. The indeterminate left breast calcifications were discussed with the patient and her sister. Given the extent of the disease in the right breast, and possibility of advanced metastatic disease, further work-up of these calcifications will be deferred until after the patient is seen  by Dr. Burr Medico. These could be biopsied in the future if it would be helpful for treatment of patient's disease.    12/01/2017 Imaging   Diagnostic Mammogram 12/01/17 IMPRESSION 1. New palpable lump in the upper outer right breast corresponds to a large 5 cm mass in the right axillary tail concerning for worsening conglomerate adenopathy vs spread of patient's known malignancy 2. In addition, increased size of previously biopsied lobular carcinoma in the medial lower right breast with new skin thickening and nipple retraction concerning for new skin involvement. 3. Increased size and number of indeterminate clustered calcifications in the left upper outer breast middle depth.    12/14/2017 Imaging   CT CAP w contrast 12/14/17  IMPRESSION: large mass within the right breast. There is parenchymal and trabecular thickening within the right breast as well as skin thickening overlying the right breast. Findings are most compatible with right breast carcinoma, potentially inflammatory carcinoma given the skin thickening. There is abnormal soft tissue within the right axilla concerning for right axillary adenopathy. Bilateral pulmonary nodules concerning for metastatic disease. Left first and right lateral sixth rib lytic lesions with associated fractures compatible with osseous metastatic disease. Patchy sclerosis within the right ilium concerning for osseous metastatic disease. Indeterminate lobular low attenuation within the stomach. Recommend further evaluation with direct visualization to exclude the possibility of gastric mass. Indeterminate low-attenuation lesions within the right kidney, likely represent cysts with elevated internal attenuation due to quantum mottle artifact. Recommend attention on follow-up exam.   12/14/2017 Imaging   Whole body bone scan 12/14/17 IMPRESSION: uptake at the lateral LEFT first  and lateral RIGHT sixth ribs corresponding to lytic lesions with pathologic fractures by CT. Multiple  sites of degenerative type uptake as above. Uptake surrounding RIGHT hip and BILATERAL knee prostheses, nonspecific, could be related to surgery if recent, at delayed intervals more suggestive of aseptic loosening or infection.    12/23/2017 -  Anti-estrogen oral therapy   -first-line anastrozole 1 mg daily starting 12/23/17 -Ibrance 125 mg daily, 3 weeks on 1 week off starting 01/06/18. pt stopped on 02/18/2018 due to her poor tolerance     03/23/2018 Tumor Marker   Tumor Marker CA 27.29 Results for CHITARA, CLONCH (MRN [id number]) as of 04/08/2018 17:47  Ref. Range 01/21/2018 09:48 02/18/2018 09:55 03/23/2018 10:14  CA 27.29 Latest Ref Range: 0.0 - 38.6 U/mL 117.7 (H) 121.9 (H) 152.7 (H)     03/24/2019 Pathology Results   Right lung Thoracentesis 831m removed  Diagnosis PLEURAL FLUID, RIGHT (SPECIMEN 1 OF 1 COLLECTED 03/24/19): - MALIGNANT CELLS CONSISTENT WITH CARCINOMA - SEE COMMENT      CURRENT THERAPY:  -first-line anastrozole 1 mg daily started 12/23/17, pt stopped on 02/18/2018 and restart on 03/2019  INTERVAL HISTORY:  Peggy WORTHEYis here for a follow up of breast cancer. She presents to the clinic alone. She is not very verbal about what may be wrong. She notes she is hard of hearing as well. Her daughter is here to be included in the visit today as she called to set this appointment.  She notes her sister and daughter brought her today. She feels better since hospitalization. She had right PE and on continuous oxygen canula. She has been breathing better. She was tested for portable oxygen and passed. Her daughter hopes she increases her appetite. She notes after discharge she wants to be seen here for aftercare.  She notes since her PE and pleural effusion treatment she has pain of right side. She is wanting to take pain meds. She notes she has been on anastrozole and restarted after discharge. She notes with liquid potassium it makes her itch. She takes mirtazapine at night.      REVIEW OF SYSTEMS:   Constitutional: Denies fevers, chills or abnormal weight loss Eyes: Denies blurriness of vision Ears, nose, mouth, throat, and face: Denies mucositis or sore throat (+) hard of hearing  Respiratory: Denies cough, dyspnea or wheezes (+) On continuous oxygen canula Cardiovascular: Denies palpitation, chest discomfort or lower extremity swelling Gastrointestinal:  Denies nausea, heartburn or change in bowel habits Skin: Denies abnormal skin rashes Lymphatics: Denies new lymphadenopathy or easy bruising Neurological:Denies numbness, tingling or new weaknesses Behavioral/Psych: Mood is stable, no new changes  All other systems were reviewed with the patient and are negative.  MEDICAL HISTORY:  Past Medical History:  Diagnosis Date   Arthritis    Asthma    Asthma exacerbation 10/14/2011   Blood transfusion without reported diagnosis    Hepatitis C    from blood transfusion   Hypertension    Pneumonia 4/02016   rt breast ca dx'd 2019    SURGICAL HISTORY: Past Surgical History:  Procedure Laterality Date   CHOLECYSTECTOMY  02/21/2002   JOINT REPLACEMENT     Bilateral total knee replacements   knee replacements Bilateral    Right Rotator Cuff Repair  08/12/2006   TOTAL HIP ARTHROPLASTY Right 11/22/2014   dr [facility]Lorin [facility]  TOTAL HIP ARTHROPLASTY Right 11/22/2014   Procedure: TOTAL HIP ARTHROPLASTY ANTERIOR APPROACH;  Surgeon: MMarybelle Killings MD;  [facility]  Service:  Orthopedics;  Laterality: Right;    I have reviewed the social history and family history with the patient and they are unchanged from previous note.  ALLERGIES:  is allergic to shrimp [shellfish allergy].  MEDICATIONS:  Current Outpatient Medications  Medication Sig Dispense Refill   acetaminophen (TYLENOL) 650 MG CR tablet Take 650 mg by mouth every 8 (eight) hours as needed for pain (pain).     albuterol (PROVENTIL) (2.5 MG/3ML) 0.083% nebulizer solution Take 3 mLs (2.5 mg  total) by nebulization every 4 (four) hours as needed for wheezing or shortness of breath (wheezing). 540 mL 0   anastrozole (ARIMIDEX) 1 MG tablet TAKE 1 TABLET(1 MG) BY MOUTH DAILY 90 tablet 1   B Complex-C (B-COMPLEX WITH VITAMIN C) tablet Take 1 tablet by mouth daily.     citalopram (CELEXA) 10 MG tablet Take 10 mg by mouth every morning.     mirtazapine (REMERON) 7.5 MG tablet Take 1 tablet (7.5 mg total) by mouth at bedtime. 30 tablet 0   Multiple Vitamin (MULTIVITAMIN WITH MINERALS) TABS tablet Take 1 tablet by mouth daily. 30 tablet 0   Potassium Chloride ER 20 MEQ TBCR Take 20 mEq by mouth daily.   0   Rivaroxaban 15 & 20 MG TBPK Take as directed on package: Start with one '15mg'$  tablet by mouth twice a day with food. On Day 22, switch to one '20mg'$  tablet once a day with food. 51 each 0   acetaminophen-codeine (TYLENOL #3) 300-30 MG tablet Take 1 tablet by mouth every 6 (six) hours as needed for moderate pain. 30 tablet 0   No current facility-administered medications for this visit.     PHYSICAL EXAMINATION: ECOG PERFORMANCE STATUS: 2 - Symptomatic, <50% confined to bed  Vitals:   04/07/19 0959  BP: 130/64  Pulse: 99  Resp: 18  Temp: 98.5 F (36.9 C)  SpO2: 96%   Filed Weights   04/07/19 0959  Weight: 155 lb (70.3 kg)    GENERAL:alert, no distress and comfortable (+) very hard of hearing  SKIN: skin color, texture, turgor are normal, no rashes or significant lesions EYES: normal, Conjunctiva are pink and non-injected, sclera clear   NECK: supple, thyroid normal size, non-tender, without nodularity LYMPH:  no palpable lymphadenopathy in the cervical, axillary  LUNGS: clear to auscultation and percussion (+) continuous oxygen (+) Pleural effusion, L>R  HEART: regular rate & rhythm and no murmurs and no lower extremity edema ABDOMEN:abdomen soft, non-tender and normal bowel sounds Musculoskeletal:no cyanosis of digits and no clubbing  NEURO: alert & oriented x 3  with fluent speech, no focal motor/sensory deficits BREAST: Her entire right breast is very firm, with skin hyperpigmentation, ulcers in the inferior right breast, left breast exam was negative   LABORATORY DATA:  I have reviewed the data as listed CBC Latest Ref Rng & Units 04/07/2019 03/26/2019 03/25/2019  WBC 4.0 - 10.5 K/uL 5.5 9.2 5.9  Hemoglobin 12.0 - 15.0 g/dL 12.3 10.5(L) 11.4(L)  Hematocrit 36.0 - 46.0 % 40.0 34.7(L) 38.2  Platelets 150 - 400 K/uL 447(H) 284 301     CMP Latest Ref Rng & Units 04/07/2019 03/26/2019 03/25/2019  Glucose 70 - 99 mg/dL 100(H) 92 94  BUN 8 - 23 mg/dL '8 9 8  '$ Creatinine 0.44 - 1.00 mg/dL 0.74 0.59 0.62  Sodium 135 - 145 mmol/L 139 139 139  Potassium 3.5 - 5.1 mmol/L 4.3 3.5 3.3(L)  Chloride 98 - 111 mmol/L 107 105 104  CO2 22 - 32  mmol/L '26 28 26  '$ Calcium 8.9 - 10.3 mg/dL 11.3(H) 9.7 11.1(H)  Total Protein 6.5 - 8.1 g/dL 6.7 5.2(L) 5.8(L)  Total Bilirubin 0.3 - 1.2 mg/dL 0.5 1.3(H) 1.0  Alkaline Phos 38 - 126 U/L 58 37(L) 44  AST 15 - 41 U/L 30 26 35  ALT 0 - 44 U/L '11 14 19      '$ RADIOGRAPHIC STUDIES: I have personally reviewed the radiological images as listed and agreed with the findings in the report. No results found.   ASSESSMENT & PLAN:  Peggy Welch is a 78 y.o. female with   1. 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 -Pt presented back to the clinic on 12/04/17 with obvious clinical and radiographic cancer progression. Her mammogram and [place] on 12/01/17 revealed larger inner lower quadrant breast tumor with skin invasion, and possible sternum invasion on mammogram. She also has a new palpable mass in the right upper quadrant of right breast, and bulky axillary adenopathy. -Her staging scan showed bone and lung metastasis. Given her advanced age, reluctant to do any procedure  or treatment, I did not biopsy any metastatic site to confirm.  -She started Anastrozole on 12/23/17 and Ibrance 2 weeks on and one week off.  Due to the significant fatigue, she stopped both medication in the end of June herself, and refused to try again.  -she was recently hospitalized for PE and b/l malignant pleural effusion -She was off anastrozole for some time in the past year but restarted after recent hospitalization in 03/2019. She is tolerating well so she can continue.  -Given recent malignant hypercalcemia from bone mets, she was given Zometa injection on 03/23/19, and will continue monthly  -I discussed her metastatic breast cancer is progressing in her lungs and her bones. She has skin ulcer under right breast likely indicating skin involvement of her metastatic breast cancer.  -We again discussed treatment option for metastatic breast cancer, she is agreeable to continue anastrozole, but not interested in more aggressive treatment. -She and her daughter would like to proceed with supportive care. I will refer her to palliative home care to get more assistance  --She does have right flank pain. I called in Tylenol #3 today to help control it. Will escalate pain meds if needed.  -F/u 2-3 weeks with zometa   2.. RLL PE, Malignant pleural Effusion, LE edema  -She was hospitalized on 03/22/19 for Pleural Effusion and PE. She was treated with IV Heparin drip. She started Xarelto as outpatient.  -She is on continuous oxygen canula -She had right thoracentesis on 7/30 with 845m removed. Cytology shows malignant carcinoma cells. She had left thoracentesis on 7/31 with 1.4 Liters removed. Cytology showed atypical cells.  -I discussed with malignant pleural effusion it will recur. AS her disease progresses this is recur more frequent. If she requires frequent thoracentesis, she may need Pleurx placed.  -Pleural effusion on exam today, L>R (04/07/19). Will schedule thoracentesis next week  3.  Osteoporosis  -She was on Fosamax, managed by her PCP Dr. BCriss Rosales -will change to zometa due to her hypercalcemia and bone metastasis  4. Hypokalemia -She states that she is on KCL 281m daily.  -She has been itching on liquid potassium. I will check level to see if she can stop  5. Right arm lymphedema -Due to metastatic adenopathy in the right axilla -  Referred her previously to lymphedema physical therapy clinic  Plan -I prescribed Tylenol #3 today  -Lab today  -Thoracentesis within a week  -Palliative referral -She will continue Anastrozole  -Lab and f/u in 2-3 weeks with zometa    No problem-specific Assessment & Plan notes found for this encounter.   Orders Placed This Encounter  Procedures   US Thoracentesis Asp Pleural space w/IMG guide    Left or right thoracentesis (the side with more effusion)    Standing Status:   Future    Standing Expiration Date:   04/06/2020    Order Specific Question:   Are labs required for specimen collection?    Answer:   No    Order Specific Question:   Reason for Exam (SYMPTOM  OR DIAGNOSIS REQUIRED)    Answer:   symptom relieve    Order Specific Question:   Preferred imaging location?    Answer:   [facility]   All questions were answered. The patient knows to call the clinic with any problems, questions or concerns. No barriers to learning was detected. I spent 25 minutes counseling the patient face to face. The total time spent in the appointment was 30 minutes and more than 50% was on counseling and review of test results     Truitt Merle, MD 04/07/2019   I, Joslyn Devon, am acting as scribe for Truitt Merle, MD.   I have reviewed the above documentation for accuracy and completeness, and I agree with the above.

## 2019-04-06 DIAGNOSIS — Z86718 Personal history of other venous thrombosis and embolism: Secondary | ICD-10-CM | POA: Insufficient documentation

## 2019-04-07 ENCOUNTER — Other Ambulatory Visit: Payer: Self-pay

## 2019-04-07 ENCOUNTER — Inpatient Hospital Stay: Payer: Medicare Other

## 2019-04-07 ENCOUNTER — Encounter: Payer: Self-pay | Admitting: Hematology

## 2019-04-07 ENCOUNTER — Inpatient Hospital Stay: Payer: Medicare Other | Attending: Hematology | Admitting: Hematology

## 2019-04-07 ENCOUNTER — Telehealth: Payer: Self-pay | Admitting: Hematology

## 2019-04-07 VITALS — BP 130/64 | HR 99 | Temp 98.5°F | Resp 18 | Ht 64.0 in | Wt 155.0 lb

## 2019-04-07 DIAGNOSIS — C50311 Malignant neoplasm of lower-inner quadrant of right female breast: Secondary | ICD-10-CM | POA: Diagnosis not present

## 2019-04-07 DIAGNOSIS — J910 Malignant pleural effusion: Secondary | ICD-10-CM | POA: Diagnosis not present

## 2019-04-07 DIAGNOSIS — R52 Pain, unspecified: Secondary | ICD-10-CM | POA: Diagnosis not present

## 2019-04-07 DIAGNOSIS — C7800 Secondary malignant neoplasm of unspecified lung: Secondary | ICD-10-CM | POA: Insufficient documentation

## 2019-04-07 DIAGNOSIS — Z79811 Long term (current) use of aromatase inhibitors: Secondary | ICD-10-CM | POA: Diagnosis not present

## 2019-04-07 DIAGNOSIS — Z9981 Dependence on supplemental oxygen: Secondary | ICD-10-CM | POA: Insufficient documentation

## 2019-04-07 DIAGNOSIS — C7951 Secondary malignant neoplasm of bone: Secondary | ICD-10-CM | POA: Diagnosis not present

## 2019-04-07 DIAGNOSIS — E876 Hypokalemia: Secondary | ICD-10-CM | POA: Insufficient documentation

## 2019-04-07 DIAGNOSIS — I2699 Other pulmonary embolism without acute cor pulmonale: Secondary | ICD-10-CM | POA: Insufficient documentation

## 2019-04-07 DIAGNOSIS — Z7901 Long term (current) use of anticoagulants: Secondary | ICD-10-CM | POA: Diagnosis not present

## 2019-04-07 DIAGNOSIS — N6453 Retraction of nipple: Secondary | ICD-10-CM | POA: Diagnosis not present

## 2019-04-07 DIAGNOSIS — S2243XA Multiple fractures of ribs, bilateral, initial encounter for closed fracture: Secondary | ICD-10-CM | POA: Insufficient documentation

## 2019-04-07 DIAGNOSIS — C50411 Malignant neoplasm of upper-outer quadrant of right female breast: Secondary | ICD-10-CM | POA: Insufficient documentation

## 2019-04-07 DIAGNOSIS — R609 Edema, unspecified: Secondary | ICD-10-CM | POA: Diagnosis not present

## 2019-04-07 DIAGNOSIS — M810 Age-related osteoporosis without current pathological fracture: Secondary | ICD-10-CM | POA: Insufficient documentation

## 2019-04-07 DIAGNOSIS — R5383 Other fatigue: Secondary | ICD-10-CM | POA: Diagnosis not present

## 2019-04-07 DIAGNOSIS — Z79899 Other long term (current) drug therapy: Secondary | ICD-10-CM | POA: Diagnosis not present

## 2019-04-07 DIAGNOSIS — N6324 Unspecified lump in the left breast, lower inner quadrant: Secondary | ICD-10-CM | POA: Diagnosis not present

## 2019-04-07 DIAGNOSIS — Z86718 Personal history of other venous thrombosis and embolism: Secondary | ICD-10-CM | POA: Diagnosis not present

## 2019-04-07 DIAGNOSIS — M1990 Unspecified osteoarthritis, unspecified site: Secondary | ICD-10-CM | POA: Diagnosis not present

## 2019-04-07 DIAGNOSIS — C773 Secondary and unspecified malignant neoplasm of axilla and upper limb lymph nodes: Secondary | ICD-10-CM | POA: Diagnosis not present

## 2019-04-07 DIAGNOSIS — H9190 Unspecified hearing loss, unspecified ear: Secondary | ICD-10-CM | POA: Diagnosis not present

## 2019-04-07 DIAGNOSIS — Z170 Estrogen receptor positive status [ER+]: Secondary | ICD-10-CM | POA: Diagnosis not present

## 2019-04-07 DIAGNOSIS — I890 Lymphedema, not elsewhere classified: Secondary | ICD-10-CM | POA: Insufficient documentation

## 2019-04-07 LAB — CBC WITH DIFFERENTIAL (CANCER CENTER ONLY)
Abs Immature Granulocytes: 0.02 10*3/uL (ref 0.00–0.07)
Basophils Absolute: 0.1 10*3/uL (ref 0.0–0.1)
Basophils Relative: 1 %
Eosinophils Absolute: 0.2 10*3/uL (ref 0.0–0.5)
Eosinophils Relative: 3 %
HCT: 40 % (ref 36.0–46.0)
Hemoglobin: 12.3 g/dL (ref 12.0–15.0)
Immature Granulocytes: 0 %
Lymphocytes Relative: 29 %
Lymphs Abs: 1.6 10*3/uL (ref 0.7–4.0)
MCH: 30.2 pg (ref 26.0–34.0)
MCHC: 30.8 g/dL (ref 30.0–36.0)
MCV: 98.3 fL (ref 80.0–100.0)
Monocytes Absolute: 0.5 10*3/uL (ref 0.1–1.0)
Monocytes Relative: 9 %
Neutro Abs: 3.2 10*3/uL (ref 1.7–7.7)
Neutrophils Relative %: 58 %
Platelet Count: 447 10*3/uL — ABNORMAL HIGH (ref 150–400)
RBC: 4.07 MIL/uL (ref 3.87–5.11)
RDW: 14.3 % (ref 11.5–15.5)
WBC Count: 5.5 10*3/uL (ref 4.0–10.5)
nRBC: 0 % (ref 0.0–0.2)

## 2019-04-07 LAB — CMP (CANCER CENTER ONLY)
ALT: 11 U/L (ref 0–44)
AST: 30 U/L (ref 15–41)
Albumin: 3.1 g/dL — ABNORMAL LOW (ref 3.5–5.0)
Alkaline Phosphatase: 58 U/L (ref 38–126)
Anion gap: 6 (ref 5–15)
BUN: 8 mg/dL (ref 8–23)
CO2: 26 mmol/L (ref 22–32)
Calcium: 11.3 mg/dL — ABNORMAL HIGH (ref 8.9–10.3)
Chloride: 107 mmol/L (ref 98–111)
Creatinine: 0.74 mg/dL (ref 0.44–1.00)
GFR, Est AFR Am: >60 mL/min (ref >60)
GFR, Estimated: >60 mL/min (ref >60)
Glucose, Bld: 100 mg/dL — ABNORMAL HIGH (ref 70–99)
Potassium: 4.3 mmol/L (ref 3.5–5.1)
Sodium: 139 mmol/L (ref 135–145)
Total Bilirubin: 0.5 mg/dL (ref 0.3–1.2)
Total Protein: 6.7 g/dL (ref 6.5–8.1)

## 2019-04-07 MED ORDER — ACETAMINOPHEN-CODEINE #3 300-30 MG PO TABS: 1.0000 | ORAL_TABLET | Freq: Four times a day (QID) | ORAL | 0 refills | Status: DC | PRN

## 2019-04-07 NOTE — Telephone Encounter (Signed)
Scheduled appt per 8/13 los/sch message  Spoke with patient daughter and she is aware of the appt date and time.

## 2019-04-08 ENCOUNTER — Telehealth: Payer: Self-pay

## 2019-04-08 NOTE — Telephone Encounter (Signed)
-----   Message from Truitt Merle, MD sent at 04/08/2019  7:48 AM EDT ----- Please let pt's daughter know her lab results, calcium slightly elevated (which was worse when she was recently admitted and was treated with zometa), anemia resolved, K normal, ok to stop K supplement, no other concerns. I will see her back in 3 weeks and give her zometa on next visit for her hypercalcemia, thanks   Truitt Merle  04/08/2019

## 2019-04-08 NOTE — Telephone Encounter (Signed)
Left voice message for patient's daughter Peggy Welch regarding lab results.  Per Dr. Burr Medico calcium is slightly elevated (was worse when she was recently hospitalized and treated with zometa), anemia has resolved, potassium is normal.  Instructed to stop the potassium supplement, no other concerns.  We will see her back on 04/28/2019 and she was given those appointments times, will get Zometa infusion that day.  Encouraged her to call back if she has questions.

## 2019-04-08 NOTE — Telephone Encounter (Signed)
Left message for patients daughter to call  Back [facility]

## 2019-04-10 ENCOUNTER — Emergency Department (HOSPITAL_COMMUNITY): Payer: Medicare Other

## 2019-04-10 ENCOUNTER — Encounter (HOSPITAL_COMMUNITY): Payer: Self-pay

## 2019-04-10 ENCOUNTER — Other Ambulatory Visit: Payer: Self-pay

## 2019-04-10 ENCOUNTER — Emergency Department (HOSPITAL_COMMUNITY)
Admission: EM | Admit: 2019-04-10 | Discharge: 2019-04-10 | Disposition: A | Discharge status: Home / Self Care | Payer: Medicare Other | Attending: Emergency Medicine | Admitting: Emergency Medicine

## 2019-04-10 DIAGNOSIS — Z79899 Other long term (current) drug therapy: Secondary | ICD-10-CM | POA: Insufficient documentation

## 2019-04-10 DIAGNOSIS — I10 Essential (primary) hypertension: Secondary | ICD-10-CM | POA: Diagnosis not present

## 2019-04-10 DIAGNOSIS — Z9889 Other specified postprocedural states: Secondary | ICD-10-CM

## 2019-04-10 DIAGNOSIS — Z853 Personal history of malignant neoplasm of breast: Secondary | ICD-10-CM | POA: Diagnosis not present

## 2019-04-10 DIAGNOSIS — J90 Pleural effusion, not elsewhere classified: Secondary | ICD-10-CM | POA: Insufficient documentation

## 2019-04-10 DIAGNOSIS — Z20828 Contact with and (suspected) exposure to other viral communicable diseases: Secondary | ICD-10-CM | POA: Insufficient documentation

## 2019-04-10 DIAGNOSIS — J45909 Unspecified asthma, uncomplicated: Secondary | ICD-10-CM | POA: Diagnosis not present

## 2019-04-10 DIAGNOSIS — Z7901 Long term (current) use of anticoagulants: Secondary | ICD-10-CM | POA: Diagnosis not present

## 2019-04-10 DIAGNOSIS — R0602 Shortness of breath: Secondary | ICD-10-CM | POA: Diagnosis present

## 2019-04-10 LAB — CBC
HCT: 40.2 % (ref 36.0–46.0)
Hemoglobin: 12.2 g/dL (ref 12.0–15.0)
MCH: 30.3 pg (ref 26.0–34.0)
MCHC: 30.3 g/dL (ref 30.0–36.0)
MCV: 100 fL (ref 80.0–100.0)
Platelets: 505 10*3/uL — ABNORMAL HIGH (ref 150–400)
RBC: 4.02 MIL/uL (ref 3.87–5.11)
RDW: 14.1 % (ref 11.5–15.5)
WBC: 6.7 10*3/uL (ref 4.0–10.5)
nRBC: 0 % (ref 0.0–0.2)

## 2019-04-10 LAB — BASIC METABOLIC PANEL
Anion gap: 5 (ref 5–15)
BUN: 8 mg/dL (ref 8–23)
CO2: 29 mmol/L (ref 22–32)
Calcium: 11.7 mg/dL — ABNORMAL HIGH (ref 8.9–10.3)
Chloride: 105 mmol/L (ref 98–111)
Creatinine, Ser: 0.63 mg/dL (ref 0.44–1.00)
GFR calc Af Amer: >60 mL/min (ref >60)
GFR calc non Af Amer: >60 mL/min (ref >60)
Glucose, Bld: 105 mg/dL — ABNORMAL HIGH (ref 70–99)
Potassium: 3.9 mmol/L (ref 3.5–5.1)
Sodium: 139 mmol/L (ref 135–145)

## 2019-04-10 LAB — PROTIME-INR
INR: 1.4 — ABNORMAL HIGH (ref 0.8–1.2)
Prothrombin Time: 17.4 seconds — ABNORMAL HIGH (ref 11.4–15.2)

## 2019-04-10 LAB — BRAIN NATRIURETIC PEPTIDE: B Natriuretic Peptide: 27.5 pg/mL (ref 0.0–100.0)

## 2019-04-10 LAB — SARS CORONAVIRUS 2 BY RT PCR (HOSPITAL ORDER, PERFORMED IN ~~LOC~~ HOSPITAL LAB): SARS Coronavirus 2: NEGATIVE

## 2019-04-10 MED ORDER — LIDOCAINE HCL 1 % IJ SOLN
INTRAMUSCULAR | Status: AC
  Filled 2019-04-10: qty 10

## 2019-04-10 MED ORDER — ALBUTEROL SULFATE HFA 108 (90 BASE) MCG/ACT IN AERS
4.0000 | INHALATION_SPRAY | Freq: Once | RESPIRATORY_TRACT | Status: AC
  Administered 2019-04-10: 12:00:00 4 via RESPIRATORY_TRACT
  Filled 2019-04-10: qty 6.7

## 2019-04-10 MED ORDER — METHYLPREDNISOLONE SODIUM SUCC 125 MG IJ SOLR
125.0000 mg | Freq: Once | INTRAMUSCULAR | Status: AC
  Administered 2019-04-10: 125 mg via INTRAVENOUS
  Filled 2019-04-10: qty 2

## 2019-04-10 NOTE — ED Provider Notes (Signed)
  Provider Note MRN:  [id number]  Arrival date & time: 04/10/19    ED Course and Medical Decision Making  Assumed care from Dr. Tomi Bamberger at shift change.  Recurrent malignant pleural effusions, to be drained here by interventional radiology, reassessed, likely discharged.  Over 1 L of pleural fluid drained by interventional radiology.  Patient observed for 1 hour afterwards and upon my evaluation she is sitting comfortably, well-appearing with normal vital signs.  She is appropriate for discharge with outpatient follow-up.  After the discussed management above, the patient was determined to be safe for discharge.  The patient was in agreement with this plan and all questions regarding their care were answered.  ED return precautions were discussed and the patient will return to the ED with any significant worsening of condition.    Final Clinical Impressions(s) / ED Diagnoses     ICD-10-CM   1. Pleural effusion  J90 IR THORACENTESIS ASP PLEURAL SPACE W/IMG GUIDE    IR THORACENTESIS ASP PLEURAL SPACE W/IMG GUIDE    ED Discharge Orders    None       Barth Kirks. [place] Small, [place] [email or web address]    Maudie Flakes, MD 04/10/19 [phone]

## 2019-04-10 NOTE — Discharge Instructions (Addendum)
You were evaluated in the Emergency Department and after careful evaluation, we did not find any emergent condition requiring admission or further testing in the hospital.  Your symptoms today seem to be due to a pleural effusion.  We recommend follow-up with interventional radiology for catheter placement.  This can likely be scheduled by your oncologist.  Please return to the Emergency Department if you experience any worsening of your condition.  We encourage you to follow up with a primary care provider.  Thank you for allowing [place] to be a part of your care.

## 2019-04-10 NOTE — ED Notes (Signed)
Patient returned from US.

## 2019-04-10 NOTE — ED Provider Notes (Signed)
[facility] DEPT Provider Note   CSN: [id number] Arrival date & time: 04/10/19  1134     History   Chief Complaint Chief Complaint  Patient presents with  . Cough  . Shortness of Breath    HPI Peggy Welch is a 78 y.o. female.     HPI Patient presented to the emergency room for evaluation of shortness of breath.  Patient has a complicated medical history.  She has a history of breast cancer.  She also has a history of recent pulmonary embolism and pleural effusions.  Patient was admitted to the hospital recently on July 28 and discharged on August 1.  At that time the patient was admitted for hypercalcemia and a pulmonary embolism.  She was discharged home on Xarelto.  Patient also had pleural effusions that required bilateral thoracentesis.  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  Palliative care was consulted and the patient decided she wanted to return home.  Patient followed up with Dr. Burr Medico on August 13.  Patient was noted to have recurrent pleural effusions on August 13.  Family states she was scheduled to have a repeat thoracentesis this coming Thursday.  Over the weekend the patient has become more short of breath.  Family brought her into the ED for that reason.  No known fevers.  No known chest pain. Past Medical History:  Diagnosis Date  . Arthritis   . Asthma   . Asthma exacerbation 10/14/2011  . Blood transfusion without reported diagnosis   . Hepatitis C    from blood transfusion  . Hypertension   . Pneumonia 4/02016  . rt breast ca dx'd 2019    Patient Active Problem List   Diagnosis Date Noted  . Personal history of venous thrombosis and embolism 04/06/2019  . 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 Pulmonary embolism ([place]) 03/22/2019  . Dysphagia 03/22/2019  . Goals of care, counseling/discussion 12/22/2017  . Breast cancer of lower-inner quadrant of right female breast ([place]) 03/05/2015   . Postoperative anemia due to acute blood loss 12/01/2014  . Cough 12/01/2014  . HCAP (healthcare-associated pneumonia) 11/30/2014  . Right lower lobe pneumonia ([place]) 11/30/2014  . Sepsis ([place]) 11/30/2014  . 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 Hypokalemia 10/14/2011  . HTN (hypertension) 10/14/2011    Past Surgical History:  Procedure Laterality Date  . CHOLECYSTECTOMY  02/21/2002  . JOINT REPLACEMENT     Bilateral total knee replacements  . knee replacements Bilateral   . Right Rotator Cuff Repair  08/12/2006  . TOTAL HIP ARTHROPLASTY Right 11/22/2014   dr Lorin Mercy  . TOTAL HIP ARTHROPLASTY Right 11/22/2014   Procedure: TOTAL HIP ARTHROPLASTY ANTERIOR APPROACH;  Surgeon: Marybelle Killings, MD;  Location: [facility];  Service: Orthopedics;  Laterality: Right;     OB History   No obstetric history on file.      Home Medications    Prior to Admission medications   Medication Sig Start Date End Date Taking? Authorizing Provider  acetaminophen (TYLENOL) 650 MG CR tablet Take 650 mg by mouth every 8 (eight) hours as needed for pain (pain).    [provider]  acetaminophen-codeine (TYLENOL #3) 300-30 MG tablet Take 1 tablet by mouth every 6 (six) hours as needed for moderate pain. 04/07/19   Truitt Merle, MD  albuterol (PROVENTIL) (2.5 MG/3ML) 0.083% nebulizer solution Take 3 mLs (2.5 mg total) by nebulization every 4 (four) hours as needed for wheezing or shortness of breath (  wheezing). 03/26/19 04/25/19  Elodia Florence., MD  anastrozole (ARIMIDEX) 1 MG tablet TAKE 1 TABLET(1 MG) BY MOUTH DAILY 03/28/19   Truitt Merle, MD  B Complex-C (B-COMPLEX WITH VITAMIN C) tablet Take 1 tablet by mouth daily.    [provider]  citalopram (CELEXA) 10 MG tablet Take 10 mg by mouth every morning. 03/10/19   [provider]  mirtazapine (REMERON) 7.5 MG tablet Take 1 tablet (7.5 mg total) by mouth at bedtime. 03/26/19 04/25/19  Elodia Florence., MD  Multiple Vitamin  (MULTIVITAMIN WITH MINERALS) TABS tablet Take 1 tablet by mouth daily. 03/27/19 04/26/19  Elodia Florence., MD  Potassium Chloride ER 20 MEQ TBCR Take 20 mEq by mouth daily.  01/21/17   [provider]  Rivaroxaban 15 & 20 MG TBPK Take as directed on package: Start with one 15mg  tablet by mouth twice a day with food. On Day 22, switch to one 20mg  tablet once a day with food. 03/26/19   Elodia Florence., MD    Family History Family History  Problem Relation Age of Onset  . Hypertension Sister     Social History Social History   Tobacco Use  . Smoking status: Never Smoker  . Smokeless tobacco: Never Used  Substance Use Topics  . Alcohol use: No  . Drug use: No     Allergies   Shrimp [shellfish allergy]   Review of Systems Review of Systems  All other systems reviewed and are negative.    Physical Exam Updated Vital Signs BP (!) 146/100 Comment: Simultaneous filing. User may not have seen previous data.  Pulse (!) 101   Temp 98.4 F (36.9 C) (Oral)   Resp (!) 30   Wt 70 kg   SpO2 98%   BMI 26.49 kg/m   Physical Exam Vitals signs and nursing note reviewed.  Constitutional:      Appearance: She is ill-appearing.     Comments: Elderly, frail  HENT:     Head: Normocephalic and atraumatic.     Right Ear: External ear normal.     Left Ear: External ear normal.  Eyes:     General: No scleral icterus.       Right eye: No discharge.        Left eye: No discharge.     Conjunctiva/sclera: Conjunctivae normal.  Neck:     Musculoskeletal: Neck supple.     Trachea: No tracheal deviation.  Cardiovascular:     Rate and Rhythm: Normal rate and regular rhythm.  Pulmonary:     Effort: Pulmonary effort is normal. Tachypnea present. No respiratory distress.     Breath sounds: No stridor. Decreased breath sounds present. No wheezing or rales.  Abdominal:     General: Bowel sounds are normal. There is no distension.     Palpations: Abdomen is soft.      Tenderness: There is no abdominal tenderness. There is no guarding or rebound.  Musculoskeletal:        General: No tenderness.  Skin:    General: Skin is warm and dry.     Findings: No rash.  Neurological:     Cranial Nerves: No cranial nerve deficit (no facial droop, extraocular movements intact, no slurred speech).     Sensory: No sensory deficit.     Motor: No abnormal muscle tone or seizure activity.     Coordination: Coordination normal.      ED Treatments / Results  Labs (all labs  ordered are listed, but only abnormal results are displayed) Labs Reviewed  SARS CORONAVIRUS 2 ([facility])  CBC  BASIC METABOLIC PANEL  BRAIN NATRIURETIC PEPTIDE  PROTIME-INR    EKG None  Radiology No results found.  Procedures Procedures (including critical care time)  Medications Ordered in ED Medications - No data to display   Initial Impression / Assessment and Plan / ED Course  I have reviewed the triage vital signs and the nursing notes.  Pertinent labs & imaging results that were available during my care of the patient were reviewed by me and considered in my medical decision making (see chart for details).  Clinical Course as of Apr 09 1514  Sun Apr 10, 2019  1402 Initially planned on admitting, doing thoracentesis as inpatient.  Discussed with Dr Anselm Pancoast.  Should be able to do a thoracentesis here and avoid having pt staying in the hospital.  Pt will require pleurx catheter however she will need to be off xarelto in order to do that so that is not an option today.  Pt and family would prefer to go home.   [JK]    Clinical Course User Index [JK] Dorie Rank, MD     Patient symptoms related to recurrent pleural effusions.  Patient has known malignant CA and is no longer undergoing any active chemotherapy treatments.  Discussed findings with the patient and her daughter.  Patient would prefer to go home if possible.  We will plan on  thoracentesis for symptomatic relief in the ED.  Patient will likely end up requiring a Pleurx catheter but this will need to be arranged.  Plan on reassessing after thoracentesis this afternoon to make sure she is feeling well enough to go home.  Anticipate this will be the case and she will be able to discharge with close follow-up.  Final Clinical Impressions(s) / ED Diagnoses   Final diagnoses:  Pleural effusion      Dorie Rank, MD 04/10/19 1517

## 2019-04-10 NOTE — ED Triage Notes (Signed)
Pt is Peggy Welch and coughing x 2-3 days. Pt always wears 2L [place]. Hx of COPD  Hx of breast cancer.  Pt arrives with daughter.

## 2019-04-10 NOTE — ED Notes (Signed)
Daughter also concerned that she had an allergic reaction to liquid potassium

## 2019-04-10 NOTE — Procedures (Signed)
Ultrasound-guided  therapeutic left thoracentesis performed yielding 1.2 liters of yellow fluid. No immediate complications. Follow-up chest x-ray pending. EBL <1 cc. Due to pt coughing/chest discomfort only the above amount of fluid was removed today.

## 2019-04-10 NOTE — ED Notes (Signed)
Patient transported to US 

## 2019-04-10 NOTE — ED Notes (Signed)
Knapp, MD at bedside.  

## 2019-04-10 NOTE — ED Notes (Signed)
Patient transported to X-ray 

## 2019-04-11 ENCOUNTER — Ambulatory Visit (HOSPITAL_COMMUNITY): Admission: RE | Admit: 2019-04-11 | Payer: Medicare Other | Source: Ambulatory Visit

## 2019-04-14 ENCOUNTER — Ambulatory Visit (HOSPITAL_COMMUNITY)
Admission: RE | Admit: 2019-04-14 | Discharge: 2019-04-14 | Disposition: A | Discharge status: Home / Self Care | Payer: Medicare Other | Source: Ambulatory Visit | Attending: Hematology | Admitting: Hematology

## 2019-04-14 ENCOUNTER — Ambulatory Visit (HOSPITAL_COMMUNITY)
Admission: RE | Admit: 2019-04-14 | Discharge: 2019-04-14 | Disposition: A | Discharge status: Home / Self Care | Payer: Medicare Other | Source: Ambulatory Visit | Attending: Student | Admitting: Student

## 2019-04-14 ENCOUNTER — Other Ambulatory Visit: Payer: Self-pay

## 2019-04-14 DIAGNOSIS — C50311 Malignant neoplasm of lower-inner quadrant of right female breast: Secondary | ICD-10-CM

## 2019-04-14 DIAGNOSIS — Z9889 Other specified postprocedural states: Secondary | ICD-10-CM

## 2019-04-14 DIAGNOSIS — R0600 Dyspnea, unspecified: Secondary | ICD-10-CM | POA: Insufficient documentation

## 2019-04-14 DIAGNOSIS — Z170 Estrogen receptor positive status [ER+]: Secondary | ICD-10-CM | POA: Diagnosis not present

## 2019-04-14 DIAGNOSIS — J90 Pleural effusion, not elsewhere classified: Secondary | ICD-10-CM | POA: Insufficient documentation

## 2019-04-14 MED ORDER — LIDOCAINE HCL 1 % IJ SOLN
INTRAMUSCULAR | Status: AC
  Filled 2019-04-14: qty 20

## 2019-04-14 NOTE — Procedures (Signed)
PROCEDURE SUMMARY:  Successful image-guided right thoracentesis. Yielded 1.1 liters of clear gold fluid. Procedure was stopped after 1.1 L due to patient coughing. Patient tolerated procedure well. No immediate complications. EBL = 0 mL.  Specimen was not sent for labs. CXR ordered.  Alexandra Louk PA-C 04/14/2019 10:04 AM

## 2019-04-18 ENCOUNTER — Telehealth: Payer: Self-pay

## 2019-04-18 NOTE — Telephone Encounter (Signed)
Follow up call to patient daughter Marcie Bal requesting information about patient appt to have Pleurx drain placed. Explained that will send a message to Dr. Burr Medico for follow up. Marcie Bal also stated that patient is still holding the Xarelto. Message sent to Dr. Burr Medico and RN Malachy Mood for follow up. Marcie Bal appreciative and had no other questions or concerns.

## 2019-04-18 NOTE — Telephone Encounter (Signed)
Received follow up from Dr. Burr Medico that patient is to restart Xarelto today for PE. Contacted the patient daughter Marcie Bal and explained that per Dr. Burr Medico the patient needs to restart Xarelto and the importance or restarting. Marcie Bal inquired about placement of Pleurx drain and explained that Dr. Burr Medico and Regan Rakers NP will look into the timing of the drain placement and once this is ordered and scheduled she will then be advised about holding the Xarelto but to continue to take it until that point. Marcie Bal verbalized understanding.

## 2019-04-19 ENCOUNTER — Other Ambulatory Visit: Payer: Self-pay

## 2019-04-19 ENCOUNTER — Telehealth: Payer: Self-pay

## 2019-04-19 ENCOUNTER — Telehealth: Payer: Self-pay | Admitting: Nurse Practitioner

## 2019-04-19 DIAGNOSIS — C50311 Malignant neoplasm of lower-inner quadrant of right female breast: Secondary | ICD-10-CM

## 2019-04-19 DIAGNOSIS — J449 Chronic obstructive pulmonary disease, unspecified: Secondary | ICD-10-CM | POA: Diagnosis not present

## 2019-04-19 DIAGNOSIS — Z170 Estrogen receptor positive status [ER+]: Secondary | ICD-10-CM

## 2019-04-19 MED ORDER — MIRTAZAPINE 7.5 MG PO TABS: 7.5000 mg | ORAL_TABLET | Freq: Every day | ORAL | 0 refills | Status: DC

## 2019-04-19 MED ORDER — POTASSIUM CHLORIDE CRYS ER 20 MEQ PO TBCR: 20.0000 meq | EXTENDED_RELEASE_TABLET | Freq: Two times a day (BID) | ORAL | 1 refills | Status: AC

## 2019-04-19 NOTE — Telephone Encounter (Signed)
Late entry: on 04/18/2019 I called patient on home and mobile #'s to assess her respiratory status and discuss her question about PleurX. No answer on either number. Will try again to reach her.  Cira Rue, NP  04/19/2019

## 2019-04-19 NOTE — Telephone Encounter (Signed)
Spoke with Marcie Bal patient's daughter regarding patient's symptoms, she states her breathing is not bad O2 sats around 94 to 95%, pain in the breast only, she will call back if her symptoms worsen and if she feels fluid is building back up.  They have appointments on 9/3 which they plan to keep or will call back sooner if symptoms worsen.  She requested a the pill form of her Potassium be sent and this has been done.

## 2019-04-20 ENCOUNTER — Inpatient Hospital Stay (HOSPITAL_COMMUNITY)
Admission: EM | Admit: 2019-04-20 | Discharge: 2019-04-24 | DRG: 438 | Disposition: A | Discharge status: Home Health | Payer: Medicare Other | Attending: Internal Medicine | Admitting: Internal Medicine

## 2019-04-20 ENCOUNTER — Encounter (HOSPITAL_COMMUNITY): Payer: Self-pay

## 2019-04-20 ENCOUNTER — Other Ambulatory Visit: Payer: Self-pay

## 2019-04-20 ENCOUNTER — Emergency Department (HOSPITAL_COMMUNITY): Payer: Medicare Other

## 2019-04-20 ENCOUNTER — Telehealth: Payer: Self-pay

## 2019-04-20 DIAGNOSIS — J810 Acute pulmonary edema: Secondary | ICD-10-CM | POA: Diagnosis not present

## 2019-04-20 DIAGNOSIS — H9190 Unspecified hearing loss, unspecified ear: Secondary | ICD-10-CM | POA: Diagnosis not present

## 2019-04-20 DIAGNOSIS — Z79811 Long term (current) use of aromatase inhibitors: Secondary | ICD-10-CM | POA: Diagnosis not present

## 2019-04-20 DIAGNOSIS — C50311 Malignant neoplasm of lower-inner quadrant of right female breast: Secondary | ICD-10-CM | POA: Diagnosis not present

## 2019-04-20 DIAGNOSIS — Z96641 Presence of right artificial hip joint: Secondary | ICD-10-CM | POA: Diagnosis not present

## 2019-04-20 DIAGNOSIS — J90 Pleural effusion, not elsewhere classified: Secondary | ICD-10-CM

## 2019-04-20 DIAGNOSIS — I10 Essential (primary) hypertension: Secondary | ICD-10-CM | POA: Diagnosis not present

## 2019-04-20 DIAGNOSIS — B1920 Unspecified viral hepatitis C without hepatic coma: Secondary | ICD-10-CM | POA: Diagnosis present

## 2019-04-20 DIAGNOSIS — L89322 Pressure ulcer of left buttock, stage 2: Secondary | ICD-10-CM | POA: Diagnosis not present

## 2019-04-20 DIAGNOSIS — Z7901 Long term (current) use of anticoagulants: Secondary | ICD-10-CM | POA: Diagnosis not present

## 2019-04-20 DIAGNOSIS — K859 Acute pancreatitis without necrosis or infection, unspecified: Principal | ICD-10-CM | POA: Diagnosis present

## 2019-04-20 DIAGNOSIS — Z86718 Personal history of other venous thrombosis and embolism: Secondary | ICD-10-CM

## 2019-04-20 DIAGNOSIS — L8990 Pressure ulcer of unspecified site, unspecified stage: Secondary | ICD-10-CM | POA: Insufficient documentation

## 2019-04-20 DIAGNOSIS — Z9049 Acquired absence of other specified parts of digestive tract: Secondary | ICD-10-CM | POA: Diagnosis not present

## 2019-04-20 DIAGNOSIS — J910 Malignant pleural effusion: Secondary | ICD-10-CM | POA: Diagnosis not present

## 2019-04-20 DIAGNOSIS — J45909 Unspecified asthma, uncomplicated: Secondary | ICD-10-CM | POA: Diagnosis present

## 2019-04-20 DIAGNOSIS — R0602 Shortness of breath: Secondary | ICD-10-CM

## 2019-04-20 DIAGNOSIS — Z79899 Other long term (current) drug therapy: Secondary | ICD-10-CM

## 2019-04-20 DIAGNOSIS — Z96653 Presence of artificial knee joint, bilateral: Secondary | ICD-10-CM | POA: Diagnosis not present

## 2019-04-20 DIAGNOSIS — Z8249 Family history of ischemic heart disease and other diseases of the circulatory system: Secondary | ICD-10-CM | POA: Diagnosis not present

## 2019-04-20 DIAGNOSIS — Z515 Encounter for palliative care: Secondary | ICD-10-CM | POA: Diagnosis not present

## 2019-04-20 DIAGNOSIS — Z170 Estrogen receptor positive status [ER+]: Secondary | ICD-10-CM | POA: Diagnosis not present

## 2019-04-20 DIAGNOSIS — I2699 Other pulmonary embolism without acute cor pulmonale: Secondary | ICD-10-CM | POA: Diagnosis not present

## 2019-04-20 DIAGNOSIS — K5900 Constipation, unspecified: Secondary | ICD-10-CM | POA: Diagnosis not present

## 2019-04-20 DIAGNOSIS — I2782 Chronic pulmonary embolism: Secondary | ICD-10-CM

## 2019-04-20 DIAGNOSIS — Z66 Do not resuscitate: Secondary | ICD-10-CM | POA: Diagnosis present

## 2019-04-20 DIAGNOSIS — Z20828 Contact with and (suspected) exposure to other viral communicable diseases: Secondary | ICD-10-CM | POA: Diagnosis present

## 2019-04-20 DIAGNOSIS — N281 Cyst of kidney, acquired: Secondary | ICD-10-CM | POA: Diagnosis not present

## 2019-04-20 DIAGNOSIS — L89312 Pressure ulcer of right buttock, stage 2: Secondary | ICD-10-CM | POA: Diagnosis present

## 2019-04-20 DIAGNOSIS — J9611 Chronic respiratory failure with hypoxia: Secondary | ICD-10-CM | POA: Diagnosis not present

## 2019-04-20 DIAGNOSIS — J9811 Atelectasis: Secondary | ICD-10-CM | POA: Diagnosis not present

## 2019-04-20 DIAGNOSIS — C50911 Malignant neoplasm of unspecified site of right female breast: Secondary | ICD-10-CM | POA: Diagnosis not present

## 2019-04-20 DIAGNOSIS — R1310 Dysphagia, unspecified: Secondary | ICD-10-CM

## 2019-04-20 LAB — CBC
HCT: 41 % (ref 36.0–46.0)
Hemoglobin: 12.6 g/dL (ref 12.0–15.0)
MCH: 30.5 pg (ref 26.0–34.0)
MCHC: 30.7 g/dL (ref 30.0–36.0)
MCV: 99.3 fL (ref 80.0–100.0)
Platelets: 345 10*3/uL (ref 150–400)
RBC: 4.13 MIL/uL (ref 3.87–5.11)
RDW: 13.8 % (ref 11.5–15.5)
WBC: 8.1 10*3/uL (ref 4.0–10.5)
nRBC: 0 % (ref 0.0–0.2)

## 2019-04-20 LAB — COMPREHENSIVE METABOLIC PANEL
ALT: 25 U/L (ref 0–44)
AST: 59 U/L — ABNORMAL HIGH (ref 15–41)
Albumin: 3.5 g/dL (ref 3.5–5.0)
Alkaline Phosphatase: 112 U/L (ref 38–126)
Anion gap: 7 (ref 5–15)
BUN: 13 mg/dL (ref 8–23)
CO2: 29 mmol/L (ref 22–32)
Calcium: 12.5 mg/dL — ABNORMAL HIGH (ref 8.9–10.3)
Chloride: 103 mmol/L (ref 98–111)
Creatinine, Ser: 0.62 mg/dL (ref 0.44–1.00)
GFR calc Af Amer: >60 mL/min (ref >60)
GFR calc non Af Amer: >60 mL/min (ref >60)
Glucose, Bld: 138 mg/dL — ABNORMAL HIGH (ref 70–99)
Potassium: 4.6 mmol/L (ref 3.5–5.1)
Sodium: 139 mmol/L (ref 135–145)
Total Bilirubin: 0.8 mg/dL (ref 0.3–1.2)
Total Protein: 7.1 g/dL (ref 6.5–8.1)

## 2019-04-20 LAB — LIPASE, BLOOD: Lipase: 185 U/L — ABNORMAL HIGH (ref 11–51)

## 2019-04-20 MED ORDER — ONDANSETRON HCL 4 MG/2ML IJ SOLN: 4.0000 mg | Freq: Four times a day (QID) | INTRAMUSCULAR | Status: DC | PRN

## 2019-04-20 MED ORDER — ACETAMINOPHEN 325 MG PO TABS: 650.0000 mg | ORAL_TABLET | Freq: Four times a day (QID) | ORAL | Status: DC | PRN

## 2019-04-20 MED ORDER — IOHEXOL 300 MG/ML  SOLN
100.0000 mL | Freq: Once | INTRAMUSCULAR | Status: AC | PRN
  Administered 2019-04-20: 100 mL via INTRAVENOUS

## 2019-04-20 MED ORDER — SODIUM CHLORIDE 0.9 % IV SOLN
INTRAVENOUS | Status: AC
  Administered 2019-04-20: via INTRAVENOUS

## 2019-04-20 MED ORDER — HYDROCODONE-ACETAMINOPHEN 5-325 MG PO TABS
1.0000 | ORAL_TABLET | ORAL | Status: DC | PRN
  Administered 2019-04-23 (×2): 2 via ORAL
  Filled 2019-04-20 (×2): qty 2

## 2019-04-20 MED ORDER — SODIUM CHLORIDE (PF) 0.9 % IJ SOLN
INTRAMUSCULAR | Status: AC
  Filled 2019-04-20: qty 50

## 2019-04-20 MED ORDER — SODIUM CHLORIDE 0.9 % IV BOLUS
2000.0000 mL | Freq: Once | INTRAVENOUS | Status: AC
  Administered 2019-04-20: 2000 mL via INTRAVENOUS

## 2019-04-20 MED ORDER — ACETAMINOPHEN 650 MG RE SUPP: 650.0000 mg | Freq: Four times a day (QID) | RECTAL | Status: DC | PRN

## 2019-04-20 MED ORDER — MORPHINE SULFATE (PF) 4 MG/ML IV SOLN
4.0000 mg | Freq: Once | INTRAVENOUS | Status: AC
  Administered 2019-04-20: 4 mg via INTRAVENOUS
  Filled 2019-04-20: qty 1

## 2019-04-20 MED ORDER — HEPARIN (PORCINE) 25000 UT/250ML-% IV SOLN
1000.0000 [IU]/h | INTRAVENOUS | Status: AC
  Administered 2019-04-20 – 2019-04-22 (×2): 1000 [IU]/h via INTRAVENOUS
  Filled 2019-04-20 (×2): qty 250

## 2019-04-20 MED ORDER — SODIUM CHLORIDE 0.9% FLUSH: 3.0000 mL | Freq: Once | INTRAVENOUS | Status: DC

## 2019-04-20 MED ORDER — ONDANSETRON HCL 4 MG/2ML IJ SOLN
4.0000 mg | Freq: Once | INTRAMUSCULAR | Status: AC
  Administered 2019-04-20: 4 mg via INTRAVENOUS
  Filled 2019-04-20: qty 2

## 2019-04-20 MED ORDER — ONDANSETRON HCL 4 MG PO TABS: 4.0000 mg | ORAL_TABLET | Freq: Four times a day (QID) | ORAL | Status: DC | PRN

## 2019-04-20 NOTE — ED Provider Notes (Signed)
[facility] DEPT Provider Note   CSN: [id number] Arrival date & time: 04/20/19  1714     History   Chief Complaint Chief Complaint  Patient presents with  . Emesis    HPI Peggy Welch is a 78 y.o. Welch history of hypertension, breast cancer here presenting with shortness of breath, abdominal pain, vomiting.  Patient has been having multiple thoracentesis recently.  Peggy Welch had about 3 over the last month or so and most recently about a week ago.  Peggy Welch states that Peggy Welch has been more short of breath over the last 2 days. Peggy Welch also had persistent vomiting and cannot able to keep anything down for the last 2 days as well.  Patient complains of diffuse abdominal pain as well. Denies any fever or sick contacts      The history is provided by the patient.    Past Medical History:  Diagnosis Date  . Arthritis   . Asthma   . Asthma exacerbation 10/14/2011  . Blood transfusion without reported diagnosis   . Hepatitis C    from blood transfusion  . Hypertension   . Pneumonia 4/02016  . rt breast ca dx'd 2019    Patient Active Problem List   Diagnosis Date Noted  . Personal history of venous thrombosis and embolism 04/06/2019  . 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 Pulmonary embolism ([place]) 03/22/2019  . Dysphagia 03/22/2019  . Goals of care, counseling/discussion 12/22/2017  . Breast cancer of lower-inner quadrant of right Welch breast ([place]) 03/05/2015  . Postoperative anemia due to acute blood loss 12/01/2014  . Cough 12/01/2014  . HCAP (healthcare-associated pneumonia) 11/30/2014  . Right lower lobe pneumonia ([place]) 11/30/2014  . Sepsis ([place]) 11/30/2014  . 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 Hypokalemia 10/14/2011  . HTN (hypertension) 10/14/2011    Past Surgical History:  Procedure Laterality Date  . CHOLECYSTECTOMY  02/21/2002  . JOINT REPLACEMENT     Bilateral total knee replacements  . knee replacements Bilateral   .  Right Rotator Cuff Repair  08/12/2006  . TOTAL HIP ARTHROPLASTY Right 11/22/2014   dr Lorin Mercy  . TOTAL HIP ARTHROPLASTY Right 11/22/2014   Procedure: TOTAL HIP ARTHROPLASTY ANTERIOR APPROACH;  Surgeon: Marybelle Killings, MD;  Location: [facility];  Service: Orthopedics;  Laterality: Right;     OB History   No obstetric history on file.      Home Medications    Prior to Admission medications   Medication Sig Start Date End Date Taking? Authorizing Provider  acetaminophen (TYLENOL) 650 MG CR tablet Take 650 mg by mouth every 8 (eight) hours as needed for pain (pain).    [provider]  acetaminophen-codeine (TYLENOL #3) 300-30 MG tablet Take 1 tablet by mouth every 6 (six) hours as needed for moderate pain. 04/07/19   Truitt Merle, MD  albuterol (PROVENTIL) (2.5 MG/3ML) 0.083% nebulizer solution Take 3 mLs (2.5 mg total) by nebulization every 4 (four) hours as needed for wheezing or shortness of breath (wheezing). 03/26/19 04/25/19  Elodia Florence., MD  anastrozole (ARIMIDEX) 1 MG tablet TAKE 1 TABLET(1 MG) BY MOUTH DAILY Patient taking differently: Take 1 mg by mouth daily.  03/28/19   Truitt Merle, MD  B Complex-C (B-COMPLEX WITH VITAMIN C) tablet Take 1 tablet by mouth daily.    [provider]  citalopram (CELEXA) 10 MG tablet Take 10 mg by mouth every morning. 03/10/19   [provider]  mirtazapine (REMERON) 7.5 MG tablet  Take 1 tablet (7.5 mg total) by mouth at bedtime. 04/19/19 05/19/19  Truitt Merle, MD  Multiple Vitamin (MULTIVITAMIN WITH MINERALS) TABS tablet Take 1 tablet by mouth daily. 03/27/19 04/26/19  Elodia Florence., MD  Potassium Chloride ER 20 MEQ TBCR Take 20 mEq by mouth daily.  01/21/17   [provider]  potassium chloride SA (K-DUR) 20 MEQ tablet Take 1 tablet (20 mEq total) by mouth 2 (two) times daily. 04/19/19   Truitt Merle, MD  Rivaroxaban 15 & 20 MG TBPK Take as directed on package: Start with one 15mg  tablet by mouth twice a day with food. On Day  22, switch to one 20mg  tablet once a day with food. 03/26/19   Elodia Florence., MD    Family History Family History  Problem Relation Age of Onset  . Hypertension Sister     Social History Social History   Tobacco Use  . Smoking status: Never Smoker  . Smokeless tobacco: Never Used  Substance Use Topics  . Alcohol use: No  . Drug use: No     Allergies   Shrimp [shellfish allergy]   Review of Systems Review of Systems  Respiratory: Positive for shortness of breath.   Gastrointestinal: Positive for abdominal pain and vomiting.  All other systems reviewed and are negative.    Physical Exam Updated Vital Signs BP (!) 154/89   Pulse 86   Temp 98.1 F (36.7 C)   Resp 18   Wt 70 kg   SpO2 99%   BMI 26.49 kg/m   Physical Exam Vitals signs and nursing note reviewed.  HENT:     Head: Normocephalic.     Nose: Nose normal.  Eyes:     Extraocular Movements: Extraocular movements intact.     Pupils: Pupils are equal, round, and reactive to light.  Neck:     Musculoskeletal: Normal range of motion.  Cardiovascular:     Rate and Rhythm: Normal rate and regular rhythm.     Pulses: Normal pulses.  Pulmonary:     Comments: Crackles bilateral bases, worse on the left  Abdominal:     General: Abdomen is flat.     Comments: + mild diffuse lower abdominal tenderness   Musculoskeletal: Normal range of motion.     Comments: 2+ edema bilaterally   Skin:    General: Skin is warm.     Capillary Refill: Capillary refill takes less than 2 seconds.  Neurological:     General: No focal deficit present.     Mental Status: Peggy Welch is alert and oriented to person, place, and time.  Psychiatric:        Mood and Affect: Mood normal.        Behavior: Behavior normal.      ED Treatments / Results  Labs (all labs ordered are listed, but only abnormal results are displayed) Labs Reviewed  LIPASE, BLOOD - Abnormal; Notable for the following components:      Result Value    Lipase 185 (*)    All other components within normal limits  COMPREHENSIVE METABOLIC PANEL - Abnormal; Notable for the following components:   Glucose, Bld 138 (*)    Calcium 12.5 (*)    AST 59 (*)    All other components within normal limits  CBC  URINALYSIS, ROUTINE W REFLEX MICROSCOPIC    EKG EKG Interpretation  Date/Time:  Wednesday April 20 2019 18:35:07 EDT Ventricular Rate:  87 PR Interval:    QRS Duration:  117 QT Interval:  369 QTC Calculation: 444 R Axis:   5 Text Interpretation:  Normal sinus rhythm Nonspecific intraventricular conduction delay Anteroseptal infarct, age indeterminate Confirmed by Wandra Arthurs [phone]) on 04/20/2019 6:37:16 PM   Radiology Dg Chest Port 1 View  Result Date: 04/20/2019 CLINICAL DATA:  78 year old Welch with shortness of breath EXAM: PORTABLE CHEST 1 VIEW COMPARISON:  Prior chest x-ray 04/14/2019 FINDINGS: Small to moderate bilateral layering pleural effusions larger on the left than the right. The cardiac contours are largely obscured. Suspect underlying cardiomegaly. Atherosclerotic calcifications present in the transverse aorta. Pulmonary vascular congestion is present. No evidence of pneumothorax. No acute osseous abnormality. Degenerative changes at the left glenohumeral joint. Soft tissue anchors in the right humeral head from prior rotator cuff repair. IMPRESSION: 1. Small to moderate bilateral layering pleural effusions, larger on the left than the right. 2. Pulmonary vascular congestion with mild pulmonary edema. 3.  Aortic Atherosclerosis (ICD10-170.0) Electronically Signed   By: Jacqulynn Cadet M.D.   On: 04/20/2019 18:49    Procedures Procedures (including critical care time)  Medications Ordered in ED Medications  sodium chloride (PF) 0.9 % injection (has no administration in time range)  morphine 4 MG/ML injection 4 mg (4 mg Intravenous Given 04/20/19 1828)  ondansetron (ZOFRAN) injection 4 mg (4 mg Intravenous Given 04/20/19  1825)  iohexol (OMNIPAQUE) 300 MG/ML solution 100 mL (100 mLs Intravenous Contrast Given 04/20/19 1859)     Initial Impression / Assessment and Plan / ED Course  I have reviewed the triage vital signs and the nursing notes.  Pertinent labs & imaging results that were available during my care of the patient were reviewed by me and considered in my medical decision making (see chart for details).       PAYZLIE MCCREEDY is a 78 y.o. Welch here with abdominal pain, SOB. Has metastatic breast cancer and required multiple thoracentesis.  Consider recurrent pleural effusion versus worsening cancer.  Also consider pancreatitis or colitis.  Will get labs and chest x-ray and if Peggy Welch has recurrent pleural effusions will need a CT chest as well. We will also get CT abdomen pelvis as well.  8:25 PM Labs showed lipase 185. CXR showed recurrent pleural effusions. CT showed worsening cancer and acute pancreatitis. Given pain meds, IVF. Hospitalist to admit.    Final Clinical Impressions(s) / ED Diagnoses   Final diagnoses:  None    ED Discharge Orders    None       Drenda Freeze, MD 04/20/19 2025

## 2019-04-20 NOTE — H&P (Signed)
Peggy Welch Perfetti [id number] DOB: 03-16-1941 DOA: 04/20/2019     PCP: Lucianne Lei, MD   Outpatient Specialists:    .  Oncology  Dr. Veva Holes, Jenny Reichmann, MD as Consulting Physician (Radiation Oncology) Coralie Keens, MD as Consulting Physician (General Surgery)  Patient arrived to ER on 04/20/19 at 1714  Patient coming from: home Lives With family   Chief Complaint:  Chief Complaint  Patient presents with   Emesis    HPI: Peggy Welch is a 78 y.o. female with medical history significant of breast cancer metastases, pleural effusion metastatic, PE on Xarelto, hard of hearing, chronic hypoxia on oxygen, asthma, hepatitis C from blood transfusion, HTN Dysphasia on dysphagia 2 diet, hypercalcemia  Presented with constipation for the past 2 days and now emesis.  Significant right-sided abdominal pain No short of breath for the past 2 days has been having persistent vomiting unable to keep anything down.  Has diffuse abdominal pain no fevers or chills  Last time during admission initial calcium was up to 13.6 is improved with IV fluids and Zometa. She was noted to have bilateral exudative pleural effusions required thoracentesis on the 30th and then again on 31st  HEr Xarelto was just restarted 2 days ago on 8/24 but she have not had It today  Infectious risk factors:  Reports , N/V abdominal pain,    In  ER RAPID COVID TEST  in house testing  Pending     Regarding pertinent Chronic problems:       Breast cancer on Arimidex followed by oncology, had palliative care evaluation while hospitalized and stated she is not interested in further work-up or treatment of her cancer declined hospice still   Right side pulmonary embolism on Xarelto diagnosed in end of July     While in ER:  The following Work up has been ordered so far:  Orders Placed This Encounter  Procedures   SARS CORONAVIRUS 2 (TAT 6-12 HRS) Nasal Swab Aptima Multi Swab   CT ABDOMEN PELVIS W  CONTRAST   DG Chest Port 1 View   CT Chest W Contrast   Lipase, blood   Comprehensive metabolic panel   CBC   Urinalysis, Routine w reflex microscopic   Diet NPO time specified   Saline Lock IV, Maintain IV access   Consult to hospitalist  ALL PATIENTS BEING ADMITTED/HAVING PROCEDURES NEED COVID-19 SCREENING   EKG 12-Lead   EKG 12-Lead     Following Medications were ordered in ER: Medications  sodium chloride (PF) 0.9 % injection (has no administration in time range)  sodium chloride 0.9 % bolus 2,000 mL (has no administration in time range)  morphine 4 MG/ML injection 4 mg (4 mg Intravenous Given 04/20/19 1828)  ondansetron (ZOFRAN) injection 4 mg (4 mg Intravenous Given 04/20/19 1825)  iohexol (OMNIPAQUE) 300 MG/ML solution 100 mL (100 mLs Intravenous Contrast Given 04/20/19 1859)        Consult Orders  (From admission, onward)         Start     Ordered   04/20/19 2007  Consult to hospitalist  ALL PATIENTS BEING ADMITTED/HAVING PROCEDURES NEED COVID-19 SCREENING  Once    Comments: ALL PATIENTS BEING ADMITTED/HAVING PROCEDURES NEED COVID-19 SCREENING  Provider:  (Not yet assigned)  Question Answer Comment  Place call to: [facility]   Reason for Consult Admit      04/20/19 2006            Significant initial  Findings:  Abnormal Labs Reviewed  LIPASE, BLOOD - Abnormal; Notable for the following components:      Result Value   Lipase 185 (*)    All other components within normal limits  COMPREHENSIVE METABOLIC PANEL - Abnormal; Notable for the following components:   Glucose, Bld 138 (*)    Calcium 12.5 (*)    AST 59 (*)    All other components within normal limits   Otherwise labs showing:    Recent Labs  Lab 04/20/19 1749  NA 139  K 4.6  CO2 29  GLUCOSE 138*  BUN 13  CREATININE 0.62  CALCIUM 12.5*    Cr  stable,    Lab Results  Component Value Date   CREATININE 0.62 04/20/2019   CREATININE 0.63 04/10/2019   CREATININE 0.74  04/07/2019    Recent Labs  Lab 04/20/19 1749  AST 59*  ALT 25  ALKPHOS 112  BILITOT 0.8  PROT 7.1  ALBUMIN 3.5   Lab Results  Component Value Date   CALCIUM 12.5 (H) 04/20/2019   PHOS 2.8 03/25/2019      WBC      Component Value Date/Time   WBC 8.1 04/20/2019 1749   ANC    Component Value Date/Time   NEUTROABS 3.2 04/07/2019 1058   NEUTROABS 3.1 03/27/2015 1144   ALC No results found for: LYMPHOABS    Plt: Lab Results  Component Value Date   PLT 345 04/20/2019     Lactic Acid, Venous    Component Value Date/Time   LATICACIDVEN 1.04 11/30/2014 1920    Procalcitonin Ordered   COVID-19 Labs  No results for input(s): DDIMER, FERRITIN, LDH, CRP in the last 72 hours.  Lab Results  Component Value Date   SARSCOV2NAA NEGATIVE 04/10/2019   [place] NEGATIVE 03/22/2019      HG/HCT  stable,      Component Value Date/Time   HGB 12.6 04/20/2019 1749   HGB 12.3 04/07/2019 1058   HGB 12.2 03/27/2015 1144   HCT 41.0 04/20/2019 1749   HCT 37.1 03/27/2015 1144    Recent Labs  Lab 04/20/19 1749  LIPASE 185*   No results for input(s): AMMONIA in the last 168 hours.  No components found for: LABALBU     BNP (last 3 results) Recent Labs    04/10/19 1202  BNP 27.5      ECG:  Personally reviewed by me showing: HR : 87 Rhythm:  NSR   no evidence of ischemic changes QTC 444     CXR - recurrent pleural effusion  CTabd/pelvis -  Ct showed pancreatitis, and progression of cancer      ED Triage Vitals  Enc Vitals Group     BP 04/20/19 1726 134/85     Pulse Rate 04/20/19 1726 88     Resp 04/20/19 1726 14     Temp 04/20/19 1727 98.1 F (36.7 C)     Temp src --      SpO2 04/20/19 1726 100 %     Weight 04/20/19 1726 154 lb 5.2 oz (70 kg)     Height --      Head Circumference --      Peak Flow --      Pain Score 04/20/19 1726 10     Pain Loc --      Pain Edu? --      Excl. in [place]? --   TMAX(24)@       Latest  Blood pressure  117/67, pulse 82, temperature  98.1 F (36.7 C), resp. rate 18, weight 70 kg, SpO2 97 %.     Hospitalist was called for admission for pancreatitis  abdominal pain, nausea, vomiting, loss of appetite,  Review of Systems:    Pertinent positives include:  fatigue,   Constitutional:  No weight loss, night sweats, Fevers, chillsweight loss  HEENT:  No headaches, Difficulty swallowing,Tooth/dental problems,Sore throat,  No sneezing, itching, ear ache, nasal congestion, post nasal drip,  Cardio-vascular:  No chest pain, Orthopnea, PND, anasarca, dizziness, palpitations.no Bilateral lower extremity swelling  GI:  No heartburn, indigestion, diarrhea, change in bowel habits,  melena, blood in stool, hematemesis Resp:  no shortness of breath at rest. No dyspnea on exertion, No excess mucus, no productive cough, No non-productive cough, No coughing up of blood.No change in color of mucus.No wheezing. Skin:  no rash or lesions. No jaundice GU:  no dysuria, change in color of urine, no urgency or frequency. No straining to urinate.  No flank pain.  Musculoskeletal:  No joint pain or no joint swelling. No decreased range of motion. No back pain.  Psych:  No change in mood or affect. No depression or anxiety. No memory loss.  Neuro: no localizing neurological complaints, no tingling, no weakness, no double vision, no gait abnormality, no slurred speech, no confusion  All systems reviewed and apart from [place] all are negative  Past Medical History:   Past Medical History:  Diagnosis Date   Arthritis    Asthma    Asthma exacerbation 10/14/2011   Blood transfusion without reported diagnosis    Hepatitis C    from blood transfusion   Hypertension    Pneumonia 4/02016   rt breast ca dx'd 2019     Past Surgical History:  Procedure Laterality Date   CHOLECYSTECTOMY  02/21/2002   JOINT REPLACEMENT     Bilateral total knee replacements   knee replacements Bilateral    Right  Rotator Cuff Repair  08/12/2006   TOTAL HIP ARTHROPLASTY Right 11/22/2014   dr Lorin Mercy   TOTAL HIP ARTHROPLASTY Right 11/22/2014   Procedure: TOTAL HIP ARTHROPLASTY ANTERIOR APPROACH;  Surgeon: Marybelle Killings, MD;  Location: [facility];  Service: Orthopedics;  Laterality: Right;    Social History:  Ambulatory    Cane,      reports that she has never smoked. She has never used smokeless tobacco. She reports that she does not drink alcohol or use drugs.     Family History:   Family History  Problem Relation Age of Onset   Hypertension Sister     Allergies: Allergies  Allergen Reactions   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    Swelling in throat     Prior to Admission medications   Medication Sig Start Date End Date Taking? Authorizing Provider  acetaminophen (TYLENOL) 650 MG CR tablet Take 650 mg by mouth every 8 (eight) hours as needed for pain (pain).    [provider]  acetaminophen-codeine (TYLENOL #3) 300-30 MG tablet Take 1 tablet by mouth every 6 (six) hours as needed for moderate pain. 04/07/19   Truitt Merle, MD  albuterol (PROVENTIL) (2.5 MG/3ML) 0.083% nebulizer solution Take 3 mLs (2.5 mg total) by nebulization every 4 (four) hours as needed for wheezing or shortness of breath (wheezing). 03/26/19 04/25/19  Elodia Florence., MD  anastrozole (ARIMIDEX) 1 MG tablet TAKE 1 TABLET(1 MG) BY MOUTH DAILY Patient taking differently: Take 1 mg by mouth daily.  03/28/19   Truitt Merle, MD  B Complex-C (  B-COMPLEX WITH VITAMIN C) tablet Take 1 tablet by mouth daily.    [provider]  citalopram (CELEXA) 10 MG tablet Take 10 mg by mouth every morning. 03/10/19   [provider]  mirtazapine (REMERON) 7.5 MG tablet Take 1 tablet (7.5 mg total) by mouth at bedtime. 04/19/19 05/19/19  Truitt Merle, MD  Multiple Vitamin (MULTIVITAMIN WITH MINERALS) TABS tablet Take 1 tablet by mouth daily. 03/27/19 04/26/19  Elodia Florence., MD  Potassium Chloride ER 20 MEQ TBCR  Take 20 mEq by mouth daily.  01/21/17   [provider]  potassium chloride SA (K-DUR) 20 MEQ tablet Take 1 tablet (20 mEq total) by mouth 2 (two) times daily. 04/19/19   Truitt Merle, MD  Rivaroxaban 15 & 20 MG TBPK Take as directed on package: Start with one 15mg  tablet by mouth twice a day with food. On Day 22, switch to one 20mg  tablet once a day with food. 03/26/19   Elodia Florence., MD   Physical Exam: Blood pressure 117/67, pulse 82, temperature 98.1 F (36.7 C), resp. rate 18, weight 70 kg, SpO2 97 %. 1. General:  in No Acute distress   Chronically ill  -appearing 2. Psychological: Alert and    Oriented 3. Head/ENT:    Dry Mucous Membranes                          Head Non traumatic, neck supple                           Poor Dentition 4. SKIN: decreased Skin turgor,  Skin clean Dry and intact no rash, Right breast with evidence of malignancy noted 5. Heart: Regular rate and rhythm no Murmur, no Rub or gallop 6. Lungs:   no wheezes or crackles   7. Abdomen: Soft,  non-tender, Non distended bowel sounds present 8. Lower extremities: no clubbing, cyanosis, L>R   edema 9. Neurologically Grossly intact, moving all 4 extremities equally  10. MSK: Normal range of motion   All other LABS:     Recent Labs  Lab 04/20/19 1749  WBC 8.1  HGB 12.6  HCT 41.0  MCV 99.3  PLT 345     Recent Labs  Lab 04/20/19 1749  NA 139  K 4.6  CL 103  CO2 29  GLUCOSE 138*  BUN 13  CREATININE 0.62  CALCIUM 12.5*     Recent Labs  Lab 04/20/19 1749  AST 59*  ALT 25  ALKPHOS 112  BILITOT 0.8  PROT 7.1  ALBUMIN 3.5      Cultures:    Component Value Date/Time   SDES  03/25/2019 1519    PLEURAL LEFT Performed at [facility], [place] [address], [place], [place] [address]    SPECREQUEST  03/25/2019 1519    NONE Performed at [facility], [place] [address] [place], [place] [address]    CULT  03/25/2019 1519    NO GROWTH 3  DAYS Performed at [facility] [address] [place], [place] [address]    REPTSTATUS 03/29/2019 FINAL 03/25/2019 1519     Radiological Exams on Admission: Ct Chest W Contrast  Result Date: 04/20/2019 CLINICAL DATA:  78 year old female with known right-sided breast cancer, right-sided abdominal pain and vomiting. EXAM: CT CHEST, ABDOMEN, AND PELVIS WITH CONTRAST TECHNIQUE: Multidetector CT imaging of the chest, abdomen and pelvis was performed following  the standard protocol during bolus administration of intravenous contrast. CONTRAST:  130mL OMNIPAQUE IOHEXOL 300 MG/ML  SOLN COMPARISON:  Prior CT scan of the chest 03/22/2019; prior CT scan of the abdomen and pelvis 12/14/2017 FINDINGS: CT CHEST FINDINGS Cardiovascular: The heart is normal in size. No pericardial effusion. Calcifications visualized along the coronary arteries. No significant aortic aneurysm or evidence of dissection. The main pulmonary artery is normal in caliber and relatively well opacified. No central pulmonary embolus. Mediastinum/Nodes: Dystrophic calcification in the left adrenal gland. No definite mediastinal or hilar lymph nodes. Lungs/Pleura: Large bilateral pleural effusions with associated significant lower lobe atelectasis bilaterally. The aerated portions of the lungs remain clear. No suspicious pulmonary mass or nodule visualized. Musculoskeletal: No acute fracture or aggressive appearing lytic or blastic osseous lesion. The right breast and chest wall are essentially completely replaced by extensive irregular soft tissue. There is marked skin thickening of the overlying breast. Soft tissue irregularity extends into the axilla and appears to encase the neurovascular bundle. Questionable involvement of the lateral aspect of the right first rib. CT ABDOMEN PELVIS FINDINGS Hepatobiliary: No focal liver abnormality is seen. Status post cholecystectomy. No biliary dilatation. Pancreas: The pancreatic head and uncinate  process appear slightly indistinct, particularly compared to prior imaging. There is mild peripancreatic soft tissue stranding. Spleen: Normal in size without focal abnormality. Adrenals/Urinary Tract: The adrenal glands are unremarkable. No hydronephrosis or enhancing renal mass. No nephrolithiasis. Multiple circumscribed water attenuation simple cysts in the right kidney. Stomach/Bowel: No focal bowel wall thickening or evidence of obstruction. Vascular/Lymphatic: No aneurysm or dissection. Scattered atherosclerotic vascular calcifications. The abdominal aorta is tortuous. No suspicious lymphadenopathy. Reproductive: Dystrophic calcified uterine fibroids. No adnexal masses. Other: Trace fluid in the region of the hepato duodenal ligament. Small amount of fluid tracks along the anterior pararenal fascia on the right. Musculoskeletal: No acute fracture or aggressive appearing lytic or blastic osseous lesion. Multilevel degenerative disc disease. IMPRESSION: CT CHEST 1. Continued progression of primary right breast cancer with extensive involvement of the right chest wall, axilla and right axillary neurovascular bundle. 2. Large bilateral pleural effusions with associated significant compressive atelectasis of both lower lobes. 3. Aortic and coronary artery calcifications. Aortic Atherosclerosis (ICD10-170.0). CT ABD/PELVIS 1. CT findings are suspicious for acute pancreatitis with indistinct and edematous pancreatic head and uncinate process and adjacent peripancreatic inflammatory stranding and trace fluid. 2. Additional ancillary findings as above. Electronically Signed   By: Peggy Welch M.D.   On: 04/20/2019 19:51   Ct Abdomen Pelvis W Contrast  Result Date: 04/20/2019 CLINICAL DATA:  79 year old female with known right-sided breast cancer, right-sided abdominal pain and vomiting. EXAM: CT CHEST, ABDOMEN, AND PELVIS WITH CONTRAST TECHNIQUE: Multidetector CT imaging of the chest, abdomen and pelvis was  performed following the standard protocol during bolus administration of intravenous contrast. CONTRAST:  158mL OMNIPAQUE IOHEXOL 300 MG/ML  SOLN COMPARISON:  Prior CT scan of the chest 03/22/2019; prior CT scan of the abdomen and pelvis 12/14/2017 FINDINGS: CT CHEST FINDINGS Cardiovascular: The heart is normal in size. No pericardial effusion. Calcifications visualized along the coronary arteries. No significant aortic aneurysm or evidence of dissection. The main pulmonary artery is normal in caliber and relatively well opacified. No central pulmonary embolus. Mediastinum/Nodes: Dystrophic calcification in the left adrenal gland. No definite mediastinal or hilar lymph nodes. Lungs/Pleura: Large bilateral pleural effusions with associated significant lower lobe atelectasis bilaterally. The aerated portions of the lungs remain clear. No suspicious pulmonary mass or nodule visualized. Musculoskeletal: No acute fracture or  aggressive appearing lytic or blastic osseous lesion. The right breast and chest wall are essentially completely replaced by extensive irregular soft tissue. There is marked skin thickening of the overlying breast. Soft tissue irregularity extends into the axilla and appears to encase the neurovascular bundle. Questionable involvement of the lateral aspect of the right first rib. CT ABDOMEN PELVIS FINDINGS Hepatobiliary: No focal liver abnormality is seen. Status post cholecystectomy. No biliary dilatation. Pancreas: The pancreatic head and uncinate process appear slightly indistinct, particularly compared to prior imaging. There is mild peripancreatic soft tissue stranding. Spleen: Normal in size without focal abnormality. Adrenals/Urinary Tract: The adrenal glands are unremarkable. No hydronephrosis or enhancing renal mass. No nephrolithiasis. Multiple circumscribed water attenuation simple cysts in the right kidney. Stomach/Bowel: No focal bowel wall thickening or evidence of obstruction.  Vascular/Lymphatic: No aneurysm or dissection. Scattered atherosclerotic vascular calcifications. The abdominal aorta is tortuous. No suspicious lymphadenopathy. Reproductive: Dystrophic calcified uterine fibroids. No adnexal masses. Other: Trace fluid in the region of the hepato duodenal ligament. Small amount of fluid tracks along the anterior pararenal fascia on the right. Musculoskeletal: No acute fracture or aggressive appearing lytic or blastic osseous lesion. Multilevel degenerative disc disease. IMPRESSION: CT CHEST 1. Continued progression of primary right breast cancer with extensive involvement of the right chest wall, axilla and right axillary neurovascular bundle. 2. Large bilateral pleural effusions with associated significant compressive atelectasis of both lower lobes. 3. Aortic and coronary artery calcifications. Aortic Atherosclerosis (ICD10-170.0). CT ABD/PELVIS 1. CT findings are suspicious for acute pancreatitis with indistinct and edematous pancreatic head and uncinate process and adjacent peripancreatic inflammatory stranding and trace fluid. 2. Additional ancillary findings as above. Electronically Signed   By: Peggy Welch M.D.   On: 04/20/2019 19:51   Dg Chest Port 1 View  Result Date: 04/20/2019 CLINICAL DATA:  78 year old female with shortness of breath EXAM: PORTABLE CHEST 1 VIEW COMPARISON:  Prior chest x-ray 04/14/2019 FINDINGS: Small to moderate bilateral layering pleural effusions larger on the left than the right. The cardiac contours are largely obscured. Suspect underlying cardiomegaly. Atherosclerotic calcifications present in the transverse aorta. Pulmonary vascular congestion is present. No evidence of pneumothorax. No acute osseous abnormality. Degenerative changes at the left glenohumeral joint. Soft tissue anchors in the right humeral head from prior rotator cuff repair. IMPRESSION: 1. Small to moderate bilateral layering pleural effusions, larger on the left than  the right. 2. Pulmonary vascular congestion with mild pulmonary edema. 3.  Aortic Atherosclerosis (ICD10-170.0) Electronically Signed   By: Peggy Welch M.D.   On: 04/20/2019 18:49    Chart has been reviewed    Assessment/Plan  78 y.o. female with medical history significant of breast cancer metastases, pleural effusion metastatic, PE on Xarelto, hard of hearing, chronic hypoxia on oxygen, asthma, hepatitis C from blood transfusion, HTN Dysphasia on dysphagia 2 diet, hypercalcemia  Admitted for pancreatitis  Present on Admission:  Pancreatitis -unclear etiology possibly medications  CT of abdomen showing acute pancreatitis  no evidence of pseudocyst Will rehydrate with aggressive IV fluids Keep n.p.o. Follow clinically    -Check lipid panel - Discontinue offending medications some SSRIs could have contributed Control pain with IV pain medications If no significant improvement in the next 24 hours may benefit from GI consult     HTN (hypertension)  Chronic permissive hypertension  Breast cancer of lower-inner quadrant of right female breast [facility]) -email with oncology patient has been admitted she at this point would be interested in palliative care consult   Hypercalcemia of  malignancy -rehydrate and recheck in the past has required treatment with Zometa   Pulmonary embolism ([place]) -bridged with heparin while n.p.o.  Pleural effusion patient is interested in Pleurx catheter placement at this time secondary to recurrent pleural effusions  Other plan as per orders.  DVT prophylaxis:  heparin    Code Status:  DNR/DNI as per  family  I had personally discussed CODE STATUS with patient and family    Family Communication:   Family not at  Bedside  plan of care was discussed  with  Daughter   Disposition Plan:      To home once workup is complete and patient is stable                     Would benefit from PT/OT eval prior to DC  Ordered                                         Consults called:   Emailed oncology , IR consult ordered through epic  Admission status:  ED Disposition    ED Disposition Condition Comment   Admit  The patient appears reasonably stabilized for admission considering the current resources, flow, and capabilities available in the ED at this time, and I doubt any other [facility] requiring further screening and/or treatment in the ED prior to admission is  present.          inpatient     Expect 2 midnight stay secondary to severity of patient's current illness including   hemodynamic instability despite optimal treatment (tachycardia *hypotension * tachypnea *hypoxia, hypercapnia) * Severe lab/radiological/exam abnormalities including:     and extensive comorbidities including:  malignancy,   Chronic anticoagulation  That are currently affecting medical management.   I expect  patient to be hospitalized for 2 midnights requiring inpatient medical care.  Patient is at high risk for adverse outcome (such as loss of life or disability) if not treated.  Indication for inpatient stay as follows:    severe pain requiring acute inpatient management,  inability to maintain oral hydration    Need for operative/procedural  intervention New or worsening hypoxia  Need for    IV fluids,   IV pain medications, IV blood thinner       Level of care    tele  For 12H    Precautions:   NONE   No active isolations  PPE: Used by the provider:   P100  eye Goggles,  Gloves      Breda Bond 04/21/2019, 1:04 AM    [facility]     after 2 AM please page floor coverage PA If 7AM-7PM, please contact the day team taking care of the patient using [email or web address]

## 2019-04-20 NOTE — ED Notes (Signed)
ED TO INPATIENT HANDOFF REPORT  Name/Age/Gender Peggy Welch 78 y.o. female  Code Status Code Status History    Date Active Date Inactive Code Status Order ID Comments User Context   03/22/2019 2221 03/26/2019 2200 DNR [id number]  Lenore Cordia, MD ED   11/30/2014 2135 12/06/2014 1703 Full Code [id number]  Etta Quill, DO ED   11/22/2014 1628 11/25/2014 1802 Full Code [id number]  Lanae Crumbly, PA-C Inpatient   10/14/2011 1552 10/16/2011 1922 Full Code [id number]  Theodis Blaze, MD ED   Advance Care Planning Activity    Questions for Most Recent Historical Code Status (Order [id number])    Question Answer Comment   In the event of cardiac or respiratory ARREST Do not call a "code blue"    In the event of cardiac or respiratory ARREST Do not perform Intubation, CPR, defibrillation or ACLS    In the event of cardiac or respiratory ARREST Use medication by any route, position, wound care, and other measures to relive pain and suffering. May use oxygen, suction and manual treatment of airway obstruction as needed for comfort.         Advance Directive Documentation     Most Recent Value  Type of Advance Directive  Healthcare Power of Attorney  Pre-existing out of facility DNR order (yellow form or pink MOST form)  -  "MOST" Form in Place?  -      Home/SNF/Other Home  Chief Complaint SOB, Emesis   Level of Care/Admitting Diagnosis ED Disposition    ED Disposition Condition [place]: [place] [100102]  Level of Care: Telemetry [5]  Admit to tele based on following criteria: Other see comments  Comments: hypercalcemia  Covid Evaluation: Asymptomatic Screening Protocol (No Symptoms)  Diagnosis: Pancreatitis [id number]  Admitting Physician: Toy Baker [[id number]]  Attending Physician: Toy Baker [[id number]]  Estimated length of stay: 3 - 4 days  Certification:: I certify this patient will need inpatient services for at least 2  midnights  PT Class (Do Not Modify): Inpatient [101]  PT Acc Code (Do Not Modify): Private [1]       Medical History Past Medical History:  Diagnosis Date  . Arthritis   . Asthma   . Asthma exacerbation 10/14/2011  . Blood transfusion without reported diagnosis   . Hepatitis C    from blood transfusion  . Hypertension   . Pneumonia 4/02016  . rt breast ca dx'd 2019    Allergies Allergies  Allergen Reactions  . 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    Swelling in throat    IV Location/Drains/Wounds Patient Lines/Drains/Airways Status   Active Line/Drains/Airways    Name:   Placement date:   Placement time:   Site:   Days:   Peripheral IV 04/20/19 Left;Distal Forearm   04/20/19    1822    Forearm   less than 1   External Urinary Catheter   03/24/19    1855    -   27   Incision (Closed) 11/22/14 Hip Right   11/22/14    1438     1610   Wound / Incision (Open or Dehisced) 03/25/19 Non-pressure wound;Other (Comment) Breast Right   03/25/19    1216    Breast   26          Labs/Imaging Results for orders placed or performed during the hospital encounter of 04/20/19 (from the past 48 hour(s))  Lipase, blood     Status:  Abnormal   Collection Time: 04/20/19  5:49 PM  Result Value Ref Range   Lipase 185 (H) 11 - 51 U/L    Comment: Performed at [facility], [place] [address], [place], [place] [address]  Comprehensive metabolic panel     Status: Abnormal   Collection Time: 04/20/19  5:49 PM  Result Value Ref Range   Sodium 139 135 - 145 mmol/L   Potassium 4.6 3.5 - 5.1 mmol/L   Chloride 103 98 - 111 mmol/L   CO2 29 22 - 32 mmol/L   Glucose, Bld 138 (H) 70 - 99 mg/dL   BUN 13 8 - 23 mg/dL   Creatinine, Ser 0.62 0.44 - 1.00 mg/dL   Calcium 12.5 (H) 8.9 - 10.3 mg/dL   Total Protein 7.1 6.5 - 8.1 g/dL   Albumin 3.5 3.5 - 5.0 g/dL   AST 59 (H) 15 - 41 U/L   ALT 25 0 - 44 U/L   Alkaline Phosphatase 112 38 - 126 U/L   Total Bilirubin 0.8 0.3 - 1.2 mg/dL    GFR calc non Af Amer >60 >60 mL/min   GFR calc Af Amer >60 >60 mL/min   Anion gap 7 5 - 15    Comment: Performed at [facility], [place] [address] [place], [place] [address]  CBC     Status: None   Collection Time: 04/20/19  5:49 PM  Result Value Ref Range   WBC 8.1 4.0 - 10.5 K/uL   RBC 4.13 3.87 - 5.11 MIL/uL   Hemoglobin 12.6 12.0 - 15.0 g/dL   HCT 41.0 36.0 - 46.0 %   MCV 99.3 80.0 - 100.0 fL   MCH 30.5 26.0 - 34.0 pg   MCHC 30.7 30.0 - 36.0 g/dL   RDW 13.8 11.5 - 15.5 %   Platelets 345 150 - 400 K/uL   nRBC 0.0 0.0 - 0.2 %    Comment: Performed at [facility], [place] [address], [place], [place] [address]   Ct Chest W Contrast  Result Date: 04/20/2019 CLINICAL DATA:  78 year old female with known right-sided breast cancer, right-sided abdominal pain and vomiting. EXAM: CT CHEST, ABDOMEN, AND PELVIS WITH CONTRAST TECHNIQUE: Multidetector CT imaging of the chest, abdomen and pelvis was performed following the standard protocol during bolus administration of intravenous contrast. CONTRAST:  135mL OMNIPAQUE IOHEXOL 300 MG/ML  SOLN COMPARISON:  Prior CT scan of the chest 03/22/2019; prior CT scan of the abdomen and pelvis 12/14/2017 FINDINGS: CT CHEST FINDINGS Cardiovascular: The heart is normal in size. No pericardial effusion. Calcifications visualized along the coronary arteries. No significant aortic aneurysm or evidence of dissection. The main pulmonary artery is normal in caliber and relatively well opacified. No central pulmonary embolus. Mediastinum/Nodes: Dystrophic calcification in the left adrenal gland. No definite mediastinal or hilar lymph nodes. Lungs/Pleura: Large bilateral pleural effusions with associated significant lower lobe atelectasis bilaterally. The aerated portions of the lungs remain clear. No suspicious pulmonary mass or nodule visualized. Musculoskeletal: No acute fracture or aggressive appearing lytic or blastic osseous lesion. The  right breast and chest wall are essentially completely replaced by extensive irregular soft tissue. There is marked skin thickening of the overlying breast. Soft tissue irregularity extends into the axilla and appears to encase the neurovascular bundle. Questionable involvement of the lateral aspect of the right first rib. CT ABDOMEN PELVIS FINDINGS Hepatobiliary: No focal liver abnormality is seen. Status post cholecystectomy. No biliary dilatation. Pancreas: The pancreatic head and uncinate process  appear slightly indistinct, particularly compared to prior imaging. There is mild peripancreatic soft tissue stranding. Spleen: Normal in size without focal abnormality. Adrenals/Urinary Tract: The adrenal glands are unremarkable. No hydronephrosis or enhancing renal mass. No nephrolithiasis. Multiple circumscribed water attenuation simple cysts in the right kidney. Stomach/Bowel: No focal bowel wall thickening or evidence of obstruction. Vascular/Lymphatic: No aneurysm or dissection. Scattered atherosclerotic vascular calcifications. The abdominal aorta is tortuous. No suspicious lymphadenopathy. Reproductive: Dystrophic calcified uterine fibroids. No adnexal masses. Other: Trace fluid in the region of the hepato duodenal ligament. Small amount of fluid tracks along the anterior pararenal fascia on the right. Musculoskeletal: No acute fracture or aggressive appearing lytic or blastic osseous lesion. Multilevel degenerative disc disease. IMPRESSION: CT CHEST 1. Continued progression of primary right breast cancer with extensive involvement of the right chest wall, axilla and right axillary neurovascular bundle. 2. Large bilateral pleural effusions with associated significant compressive atelectasis of both lower lobes. 3. Aortic and coronary artery calcifications. Aortic Atherosclerosis (ICD10-170.0). CT ABD/PELVIS 1. CT findings are suspicious for acute pancreatitis with indistinct and edematous pancreatic head and  uncinate process and adjacent peripancreatic inflammatory stranding and trace fluid. 2. Additional ancillary findings as above. Electronically Signed   By: Jacqulynn Cadet M.D.   On: 04/20/2019 19:51   Ct Abdomen Pelvis W Contrast  Result Date: 04/20/2019 CLINICAL DATA:  78 year old female with known right-sided breast cancer, right-sided abdominal pain and vomiting. EXAM: CT CHEST, ABDOMEN, AND PELVIS WITH CONTRAST TECHNIQUE: Multidetector CT imaging of the chest, abdomen and pelvis was performed following the standard protocol during bolus administration of intravenous contrast. CONTRAST:  113mL OMNIPAQUE IOHEXOL 300 MG/ML  SOLN COMPARISON:  Prior CT scan of the chest 03/22/2019; prior CT scan of the abdomen and pelvis 12/14/2017 FINDINGS: CT CHEST FINDINGS Cardiovascular: The heart is normal in size. No pericardial effusion. Calcifications visualized along the coronary arteries. No significant aortic aneurysm or evidence of dissection. The main pulmonary artery is normal in caliber and relatively well opacified. No central pulmonary embolus. Mediastinum/Nodes: Dystrophic calcification in the left adrenal gland. No definite mediastinal or hilar lymph nodes. Lungs/Pleura: Large bilateral pleural effusions with associated significant lower lobe atelectasis bilaterally. The aerated portions of the lungs remain clear. No suspicious pulmonary mass or nodule visualized. Musculoskeletal: No acute fracture or aggressive appearing lytic or blastic osseous lesion. The right breast and chest wall are essentially completely replaced by extensive irregular soft tissue. There is marked skin thickening of the overlying breast. Soft tissue irregularity extends into the axilla and appears to encase the neurovascular bundle. Questionable involvement of the lateral aspect of the right first rib. CT ABDOMEN PELVIS FINDINGS Hepatobiliary: No focal liver abnormality is seen. Status post cholecystectomy. No biliary dilatation.  Pancreas: The pancreatic head and uncinate process appear slightly indistinct, particularly compared to prior imaging. There is mild peripancreatic soft tissue stranding. Spleen: Normal in size without focal abnormality. Adrenals/Urinary Tract: The adrenal glands are unremarkable. No hydronephrosis or enhancing renal mass. No nephrolithiasis. Multiple circumscribed water attenuation simple cysts in the right kidney. Stomach/Bowel: No focal bowel wall thickening or evidence of obstruction. Vascular/Lymphatic: No aneurysm or dissection. Scattered atherosclerotic vascular calcifications. The abdominal aorta is tortuous. No suspicious lymphadenopathy. Reproductive: Dystrophic calcified uterine fibroids. No adnexal masses. Other: Trace fluid in the region of the hepato duodenal ligament. Small amount of fluid tracks along the anterior pararenal fascia on the right. Musculoskeletal: No acute fracture or aggressive appearing lytic or blastic osseous lesion. Multilevel degenerative disc disease. IMPRESSION: CT CHEST 1. Continued  progression of primary right breast cancer with extensive involvement of the right chest wall, axilla and right axillary neurovascular bundle. 2. Large bilateral pleural effusions with associated significant compressive atelectasis of both lower lobes. 3. Aortic and coronary artery calcifications. Aortic Atherosclerosis (ICD10-170.0). CT ABD/PELVIS 1. CT findings are suspicious for acute pancreatitis with indistinct and edematous pancreatic head and uncinate process and adjacent peripancreatic inflammatory stranding and trace fluid. 2. Additional ancillary findings as above. Electronically Signed   By: Jacqulynn Cadet M.D.   On: 04/20/2019 19:51   Dg Chest Port 1 View  Result Date: 04/20/2019 CLINICAL DATA:  78 year old female with shortness of breath EXAM: PORTABLE CHEST 1 VIEW COMPARISON:  Prior chest x-ray 04/14/2019 FINDINGS: Small to moderate bilateral layering pleural effusions larger on  the left than the right. The cardiac contours are largely obscured. Suspect underlying cardiomegaly. Atherosclerotic calcifications present in the transverse aorta. Pulmonary vascular congestion is present. No evidence of pneumothorax. No acute osseous abnormality. Degenerative changes at the left glenohumeral joint. Soft tissue anchors in the right humeral head from prior rotator cuff repair. IMPRESSION: 1. Small to moderate bilateral layering pleural effusions, larger on the left than the right. 2. Pulmonary vascular congestion with mild pulmonary edema. 3.  Aortic Atherosclerosis (ICD10-170.0) Electronically Signed   By: Jacqulynn Cadet M.D.   On: 04/20/2019 18:49    Pending Labs Unresulted Labs (From admission, onward)    Start     Ordered   04/20/19 2020  SARS CORONAVIRUS 2 (TAT 6-12 HRS) Nasal Swab Aptima Multi Swab  (Asymptomatic/Tier 2 Patients Labs)  Once,   STAT    Question Answer Comment  Is this test for diagnosis or screening Screening   Symptomatic for COVID-19 as defined by CDC No   Hospitalized for COVID-19 No   Admitted to ICU for COVID-19 No   Previously tested for COVID-19 Yes   Resident in a congregate (group) care setting No   Employed in healthcare setting No   Pregnant No      04/20/19 2019   04/20/19 1731  Urinalysis, Routine w reflex microscopic  ONCE - STAT,   STAT     04/20/19 1730   Signed and Held  Magnesium  Tomorrow morning,   R    Comments: Call MD if <1.5    Signed and Held   Signed and Held  Phosphorus  Tomorrow morning,   R     Signed and Held   Signed and Held  TSH  Once,   R    Comments: Cancel if already done within 1 month and notify MD    Signed and Held   Signed and Held  Comprehensive metabolic panel  Once,   R    Comments: Cal MD for K<3.5 or >5.0    Signed and Held   Signed and Held  CBC  Once,   R    Comments: Call for hg <8.0    Signed and Held          Vitals/Pain Today's Vitals   04/20/19 2000 04/20/19 2030 04/20/19 2100  04/20/19 2130  BP: 117/67 130/74 120/71 (!) 142/96  Pulse: 82 (!) 103 (!) 118 100  Resp: 18 (!) 21 (!) 22 (!) 25  Temp:      SpO2: 97% (!) 89% 95% 96%  Weight:      PainSc:        Isolation Precautions No active isolations  Medications Medications  sodium chloride (PF) 0.9 % injection (has no administration in  time range)  morphine 4 MG/ML injection 4 mg (4 mg Intravenous Given 04/20/19 1828)  ondansetron (ZOFRAN) injection 4 mg (4 mg Intravenous Given 04/20/19 1825)  iohexol (OMNIPAQUE) 300 MG/ML solution 100 mL (100 mLs Intravenous Contrast Given 04/20/19 1859)  sodium chloride 0.9 % bolus 2,000 mL (2,000 mLs Intravenous New Bag/Given (Non-Interop) 04/20/19 2030)    Mobility walks

## 2019-04-20 NOTE — ED Triage Notes (Signed)
Pt has been unable to having emesis x 2 days. Pt states mostly right sided abd pain.

## 2019-04-20 NOTE — Telephone Encounter (Signed)
Patient's daughter Marcie Bal calls mother complaining of severe stomach pains, she just wanted to let Dr. Burr Medico know that that she is taking her to the emergency department.

## 2019-04-20 NOTE — Telephone Encounter (Signed)
Patient's daughter calls stating that her mom is constipated and is asking for suggestions of what laxative she should use.  Called her back right away did not answer got voice mail, left voice message that patient may use Miralax or Ducolax.  The Miralax is safe to use daily and up to two times daily.  Instructed her to call back if she has any questions.

## 2019-04-20 NOTE — Progress Notes (Signed)
ANTICOAGULATION CONSULT NOTE - Initial Consult  Pharmacy Consult for Heparin Indication: pulmonary embolus Bridge from rivaroxaban  Allergies  Allergen Reactions  . 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    Swelling in throat    Patient Measurements: Weight: 154 lb 5.2 oz (70 kg) Heparin Dosing Weight:   Vital Signs: Temp: 98.1 F (36.7 C) (08/26 1727) BP: 142/96 (08/26 2130) Pulse Rate: 100 (08/26 2130)  Labs: Recent Labs    04/20/19 1749  HGB 12.6  HCT 41.0  PLT 345  CREATININE 0.62    Estimated Creatinine Clearance: 54.7 mL/min (by C-G formula based on SCr of 0.62 mg/dL).   Medical History: Past Medical History:  Diagnosis Date  . Arthritis   . Asthma   . Asthma exacerbation 10/14/2011  . Blood transfusion without reported diagnosis   . Hepatitis C    from blood transfusion  . Hypertension   . Pneumonia 4/02016  . rt breast ca dx'd 2019    Medications:  Infusions:  . heparin      Assessment: Patient with rivaroxaban use for history of PE.  MD wants to start heparin bridge for rivaroxaban.  Last dose noted in Med rec 04/19/19 at 2000.  Goal of Therapy:  Heparin level 0.3-0.7 units/ml aPTT 66-102 seconds Monitor platelets by anticoagulation protocol: Yes   Plan:  Heparin drip at 1000 units/hr Daily CBC Next heparin level and PTT at 0800    Tyler Deis, Shea Stakes Crowford 04/20/2019,10:29 PM

## 2019-04-21 DIAGNOSIS — L8990 Pressure ulcer of unspecified site, unspecified stage: Secondary | ICD-10-CM | POA: Insufficient documentation

## 2019-04-21 DIAGNOSIS — K859 Acute pancreatitis without necrosis or infection, unspecified: Principal | ICD-10-CM

## 2019-04-21 DIAGNOSIS — Z86718 Personal history of other venous thrombosis and embolism: Secondary | ICD-10-CM

## 2019-04-21 LAB — CBC
HCT: 37.9 % (ref 36.0–46.0)
Hemoglobin: 11.7 g/dL — ABNORMAL LOW (ref 12.0–15.0)
MCH: 31.3 pg (ref 26.0–34.0)
MCHC: 30.9 g/dL (ref 30.0–36.0)
MCV: 101.3 fL — ABNORMAL HIGH (ref 80.0–100.0)
Platelets: 316 10*3/uL (ref 150–400)
RBC: 3.74 MIL/uL — ABNORMAL LOW (ref 3.87–5.11)
RDW: 14 % (ref 11.5–15.5)
WBC: 10.5 10*3/uL (ref 4.0–10.5)
nRBC: 0 % (ref 0.0–0.2)

## 2019-04-21 LAB — URINALYSIS, ROUTINE W REFLEX MICROSCOPIC
Bilirubin Urine: NEGATIVE
Glucose, UA: NEGATIVE mg/dL
Hgb urine dipstick: NEGATIVE
Ketones, ur: NEGATIVE mg/dL
Nitrite: NEGATIVE
Protein, ur: NEGATIVE mg/dL
Specific Gravity, Urine: >1.046 — ABNORMAL HIGH (ref 1.005–1.030)
pH: 5 (ref 5.0–8.0)

## 2019-04-21 LAB — APTT
aPTT: 75 seconds — ABNORMAL HIGH (ref 24–36)
aPTT: 76 seconds — ABNORMAL HIGH (ref 24–36)

## 2019-04-21 LAB — TSH: TSH: 0.652 u[IU]/mL (ref 0.350–4.500)

## 2019-04-21 LAB — COMPREHENSIVE METABOLIC PANEL
ALT: 18 U/L (ref 0–44)
AST: 40 U/L (ref 15–41)
Albumin: 2.8 g/dL — ABNORMAL LOW (ref 3.5–5.0)
Alkaline Phosphatase: 85 U/L (ref 38–126)
Anion gap: 6 (ref 5–15)
BUN: 12 mg/dL (ref 8–23)
CO2: 24 mmol/L (ref 22–32)
Calcium: 10.5 mg/dL — ABNORMAL HIGH (ref 8.9–10.3)
Chloride: 108 mmol/L (ref 98–111)
Creatinine, Ser: 0.59 mg/dL (ref 0.44–1.00)
GFR calc Af Amer: >60 mL/min (ref >60)
GFR calc non Af Amer: >60 mL/min (ref >60)
Glucose, Bld: 98 mg/dL (ref 70–99)
Potassium: 3.9 mmol/L (ref 3.5–5.1)
Sodium: 138 mmol/L (ref 135–145)
Total Bilirubin: 0.9 mg/dL (ref 0.3–1.2)
Total Protein: 5.9 g/dL — ABNORMAL LOW (ref 6.5–8.1)

## 2019-04-21 LAB — MAGNESIUM: Magnesium: 2.3 mg/dL (ref 1.7–2.4)

## 2019-04-21 LAB — LIPID PANEL
Cholesterol: 128 mg/dL (ref 0–200)
HDL: 48 mg/dL (ref >40)
LDL Cholesterol: 70 mg/dL (ref 0–99)
Total CHOL/HDL Ratio: 2.7 RATIO
Triglycerides: 51 mg/dL (ref <150)
VLDL: 10 mg/dL (ref 0–40)

## 2019-04-21 LAB — HEPARIN LEVEL (UNFRACTIONATED)
Heparin Unfractionated: 0.67 IU/mL (ref 0.30–0.70)
Heparin Unfractionated: 0.51 IU/mL (ref 0.30–0.70)

## 2019-04-21 LAB — SARS CORONAVIRUS 2 (TAT 6-24 HRS): SARS Coronavirus 2: NEGATIVE

## 2019-04-21 LAB — PHOSPHORUS: Phosphorus: 2.4 mg/dL — ABNORMAL LOW (ref 2.5–4.6)

## 2019-04-21 MED ORDER — BOOST / RESOURCE BREEZE PO LIQD CUSTOM
1.0000 | Freq: Two times a day (BID) | ORAL | Status: DC
  Administered 2019-04-21 – 2019-04-22 (×3): 1 via ORAL

## 2019-04-21 MED ORDER — PRO-STAT SUGAR FREE PO LIQD
30.0000 mL | Freq: Three times a day (TID) | ORAL | Status: DC
  Administered 2019-04-21 – 2019-04-24 (×3): 30 mL via ORAL
  Filled 2019-04-21 (×3): qty 30

## 2019-04-21 MED ORDER — IPRATROPIUM-ALBUTEROL 0.5-2.5 (3) MG/3ML IN SOLN
3.0000 mL | RESPIRATORY_TRACT | Status: DC | PRN
  Administered 2019-04-21 – 2019-04-22 (×3): 3 mL via RESPIRATORY_TRACT
  Filled 2019-04-21 (×3): qty 3

## 2019-04-21 MED ORDER — ADULT MULTIVITAMIN W/MINERALS CH
1.0000 | ORAL_TABLET | Freq: Every day | ORAL | Status: DC
  Administered 2019-04-21 – 2019-04-24 (×3): 1 via ORAL
  Filled 2019-04-21 (×3): qty 1

## 2019-04-21 NOTE — Consult Note (Signed)
Consultation Note Date: 04/21/2019   Patient Name: Peggy Welch  DOB: 01-24-1941  MRN: [id number]  Age / Sex: 78 y.o., female  PCP: Lucianne Lei, MD Referring Physician: Donne Hazel, MD  Reason for Consultation: Establishing goals of care and Hospice Evaluation  HPI/Patient Profile: 78 y.o. woman with metastatic breast cancer admitted with right upper quadrant abdominal pain found to have cancer progression and pancreatitis. Additionally, she has ongoing malignant pleural effusions, recurrent and hypercalcemia. She is also on anticoagulation for a PE secondary to malignancy. At this point no further aggressive treatments are planned or being offered due to her existing heavy disease burden and advanced age.   Clinical Assessment and Goals of Care:  Discussed with Dr. Burr Medico and recommendation is for Hospice Care at home. Dr. Burr Medico spoke with her daughter who agrees but there is disagreement among family members and the patient herself about accepting hospice. Given this complexity I will arrange an in person meeting for this conversation and include the patient when they are available- probably over the weekend per availability.  SUMMARY OF RECOMMENDATIONS    1. DNR 2. Continue discussion about Hospice referral. 3. Pleurx Drain Management- can be done at home 4. On antiocoagulation- will need to discuss continuing this after procedure 5. Will arrange family meeting.     I have reviewed the medical record, interviewed the patient and family, and examined the patient. The following aspects are pertinent.  Past Medical History:  Diagnosis Date  . Arthritis   . Asthma   . Asthma exacerbation 10/14/2011  . Blood transfusion without reported diagnosis   . Hepatitis C    from blood transfusion  . Hypertension   . Pneumonia 4/02016  . rt breast ca dx'd 2019   Social History   Socioeconomic  History  . Marital status: Legally Separated    Spouse name: Not on file  . Number of children: Not on file  . Years of education: Not on file  . Highest education level: Not on file  Occupational History  . Not on file  Social Needs  . Financial resource strain: Not on file  . Food insecurity    Worry: Not on file    Inability: Not on file  . Transportation needs    Medical: Not on file    Non-medical: Not on file  Tobacco Use  . Smoking status: Never Smoker  . Smokeless tobacco: Never Used  Substance and Sexual Activity  . Alcohol use: No  . Drug use: No  . Sexual activity: Never  Lifestyle  . Physical activity    Days per week: Not on file    Minutes per session: Not on file  . Stress: Not on file  Relationships  . Social Herbalist on phone: Not on file    Gets together: Not on file    Attends religious service: Not on file    Active member of club or organization: Not on file    Attends meetings of clubs or  organizations: Not on file    Relationship status: Not on file  Other Topics Concern  . Not on file  Social History Narrative  . Not on file   Family History  Problem Relation Age of Onset  . Hypertension Sister    Scheduled Meds: Continuous Infusions: . heparin 1,000 Units/hr (04/20/19 2357)   PRN Meds:.acetaminophen **OR** acetaminophen, HYDROcodone-acetaminophen, ipratropium-albuterol, ondansetron **OR** ondansetron (ZOFRAN) IV Medications Prior to Admission:  Prior to Admission medications   Medication Sig Start Date End Date Taking? Authorizing Provider  acetaminophen (TYLENOL) 650 MG CR tablet Take 650 mg by mouth every 8 (eight) hours as needed for pain (pain).   Yes [provider]  acetaminophen-codeine (TYLENOL #3) 300-30 MG tablet Take 1 tablet by mouth every 6 (six) hours as needed for moderate pain. 04/07/19  Yes Truitt Merle, MD  albuterol (PROVENTIL) (2.5 MG/3ML) 0.083% nebulizer solution Take 3 mLs (2.5 mg total) by  nebulization every 4 (four) hours as needed for wheezing or shortness of breath (wheezing). 03/26/19 04/25/19 Yes Elodia Florence., MD  anastrozole (ARIMIDEX) 1 MG tablet TAKE 1 TABLET(1 MG) BY MOUTH DAILY Patient taking differently: Take 1 mg by mouth daily.  03/28/19  Yes Truitt Merle, MD  B Complex-C (B-COMPLEX WITH VITAMIN C) tablet Take 1 tablet by mouth daily.   Yes [provider]  citalopram (CELEXA) 10 MG tablet Take 10 mg by mouth every morning. 03/10/19  Yes [provider]  mirtazapine (REMERON) 7.5 MG tablet Take 1 tablet (7.5 mg total) by mouth at bedtime. 04/19/19 05/19/19 Yes Truitt Merle, MD  Multiple Vitamin (MULTIVITAMIN WITH MINERALS) TABS tablet Take 1 tablet by mouth daily. 03/27/19 04/26/19 Yes Elodia Florence., MD  potassium chloride SA (K-DUR) 20 MEQ tablet Take 1 tablet (20 mEq total) by mouth 2 (two) times daily. 04/19/19  Yes Truitt Merle, MD  rivaroxaban (XARELTO) 20 MG TABS tablet Take 20 mg by mouth daily with supper.   Yes [provider]   Allergies  Allergen Reactions  . 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    Swelling in throat   Review of Systems  Neurological: Weakness: womanb.    Physical Exam  Vital Signs: BP 123/77 (BP Location: Right Arm)   Pulse 92   Temp 98.7 F (37.1 C) (Oral)   Resp 18   Ht 5\' 4"  (1.626 m)   Wt 74.8 kg   SpO2 94%   BMI 28.32 kg/m  Pain Scale: 0-10   Pain Score: 0-No pain   SpO2: SpO2: 94 % O2 Device:SpO2: 94 % O2 Flow Rate: .O2 Flow Rate (L/min): 2 L/min  IO: Intake/output summary:   Intake/Output Summary (Last 24 hours) at 04/21/2019 1415 Last data filed at 04/21/2019 1000 Gross per 24 hour  Intake 1089.15 ml  Output 0 ml  Net 1089.15 ml    LBM: Last BM Date: 04/19/19 Baseline Weight: Weight: 70 kg Most recent weight: Weight: 74.8 kg     Palliative Assessment/Data:      Time Total:50 Greater than 50%  of this time was spent counseling and coordinating care related to the above  assessment and plan.  Signed by: Lane Hacker, DO   Please contact Palliative Medicine Team phone at [phone] for questions and concerns.  For individual provider: See Shea Evans

## 2019-04-21 NOTE — Progress Notes (Signed)
IR requested by Dr. Roel Cluck for possible image-guided left tunneled PleurX catheter placement.  Case has been reviewed by Dr. Kathlene Cote who approves procedure, however recommending oncology consult prior to procedure work-up for their input to assure PluerX catheter is appropriate for patient.  IR to follow.   Bea Graff Ryken Paschal, PA-C 04/21/2019, 8:21 AM

## 2019-04-21 NOTE — Evaluation (Signed)
Physical Therapy Evaluation Patient Details Name: Peggy Welch MRN: [id number] DOB: Dec 13, 1940 Today's Date: 04/21/2019   History of Present Illness  78 y.o. female with medical history significant of breast cancer metastases, pleural effusion metastatic, PE on Xarelto, hard of hearing, chronic hypoxia on oxygen, asthma, hepatitis C from blood transfusion, HTN admitted with pancreatitis  Clinical Impression  Pt admitted with above diagnosis. Pt ambulated 160' with RW, no loss of balance, SaO2 93% on 2L O2, HR 121 max, 2-3/4 dyspnea with wheezing noted, RN aware and will request albuterol tx. Pt is pleasant and puts forth good effort. She is very HOH. She reports she has 24* care from her family at home. Pt currently with functional limitations due to the deficits listed below (see PT Problem List). Pt will benefit from skilled PT to increase their independence and safety with mobility to allow discharge to the venue listed below.       Follow Up Recommendations Home health PT    Equipment Recommendations  None recommended by PT    Recommendations for Other Services       Precautions / Restrictions Precautions Precautions: Other (comment) Precaution Comments: on home O2 2L, very HOH Restrictions Weight Bearing Restrictions: No      Mobility  Bed Mobility Overal bed mobility: Needs Assistance Bed Mobility: Supine to Sit     Supine to sit: Min assist     General bed mobility comments: assist to raise trunk and pivot hips to EOB  Transfers Overall transfer level: Needs assistance Equipment used: Rolling walker (2 wheeled) Transfers: Sit to/from Stand Sit to Stand: Min assist         General transfer comment: VCs hand placement  Ambulation/Gait Ambulation/Gait assistance: Min guard Gait Distance (Feet): 160 Feet Assistive device: Rolling walker (2 wheeled) Gait Pattern/deviations: Step-through pattern;Decreased stride length Gait velocity: WFL   General Gait  Details: ambulated on 2L O2, SaO2 93%, wheezing noted, 2-3/4 dyspna, requested RN call for breathing tx, no loss of balance  Stairs            Wheelchair Mobility    Modified Rankin (Stroke Patients Only)       Balance Overall balance assessment: Modified Independent                                           Pertinent Vitals/Pain Pain Assessment: No/denies pain    Home Living Family/patient expects to be discharged to:: Private residence Living Arrangements: Children Available Help at Discharge: Family;Available 24 hours/day           Home Equipment: Walker - 2 wheels;Cane - single point      Prior Function           Comments: pt reports she's independent with cane, family supervise her bathing, pt very HOH so difficult to obtain full PLOF     Hand Dominance        Extremity/Trunk Assessment   Upper Extremity Assessment Upper Extremity Assessment: Overall WFL for tasks assessed    Lower Extremity Assessment Lower Extremity Assessment: Overall WFL for tasks assessed(edema noted BLEs)    Cervical / Trunk Assessment Cervical / Trunk Assessment: Normal  Communication   Communication: HOH  Cognition Arousal/Alertness: Awake/alert Behavior During Therapy: WFL for tasks assessed/performed Overall Cognitive Status: Within Functional Limits for tasks assessed  General Comments      Exercises     Assessment/Plan    PT Assessment Patient needs continued PT services  PT Problem List Decreased mobility;Decreased activity tolerance       PT Treatment Interventions Gait training;Therapeutic exercise;Therapeutic activities    PT Goals (Current goals can be found in the Care Plan section)  Acute Rehab PT Goals Patient Stated Goal: none stated Time For Goal Achievement: 05/05/19 Potential to Achieve Goals: Good    Frequency Min 3X/week   Barriers to discharge         Co-evaluation               AM-PAC PT "6 Clicks" Mobility  Outcome Measure Help needed turning from your back to your side while in a flat bed without using bedrails?: A Little Help needed moving from lying on your back to sitting on the side of a flat bed without using bedrails?: A Little Help needed moving to and from a bed to a chair (including a wheelchair)?: A Little Help needed standing up from a chair using your arms (e.g., wheelchair or bedside chair)?: A Little Help needed to walk in hospital room?: None Help needed climbing 3-5 steps with a railing? : A Little 6 Click Score: 19    End of Session Equipment Utilized During Treatment: Gait belt;Oxygen Activity Tolerance: Patient tolerated treatment well Patient left: in chair;with call bell/phone within reach;with chair alarm set Nurse Communication: Mobility status PT Visit Diagnosis: Difficulty in walking, not elsewhere classified (R26.2)    Time: 1202-1229 PT Time Calculation (min) (ACUTE ONLY): 27 min   Charges:   PT Evaluation $PT Eval Low Complexity: 1 Low PT Treatments $Gait Training: 8-22 mins       Blondell Reveal Kistler PT 04/21/2019  Acute Rehabilitation Services Pager [phone] Office [phone]

## 2019-04-21 NOTE — Progress Notes (Addendum)
ANTICOAGULATION CONSULT NOTE - Follow Up Consult  Pharmacy Consult for IV heparin  Indication: history of PE  Allergies  Allergen Reactions  . 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    Swelling in throat    Patient Measurements: Height: 5\' 4"  (162.6 cm) Weight: 165 lb (74.8 kg) IBW/kg (Calculated) : 54.7 Heparin Dosing Weight: 70.3 kg  Vital Signs: Temp: 98.7 F (37.1 C) (08/27 0635) Temp Source: Oral (08/27 0635) BP: 123/77 (08/27 0635) Pulse Rate: 92 (08/27 0832)  Labs: Recent Labs    04/20/19 1749 04/21/19 0748  HGB 12.6 11.7*  HCT 41.0 37.9  PLT 345 316  APTT  --  75*  HEPARINUNFRC  --  0.67  CREATININE 0.62 0.59    Estimated Creatinine Clearance: 56.4 mL/min (by C-G formula based on SCr of 0.59 mg/dL).   Medications:  PTA Xarelto 20mg  daily with supper-last dose 04/19/2019 at 2000  Assessment: 2 y/oF with PMH of breast cancer, recurrent pleural effusions requiring thoracentesis, and history of PE on 03/22/2019 on Xarelto PTA admitted with pancreatitis. Per notes, patient restarted Xarelto on 04/18/2019. Pharmacy consulted for IV heparin dosing while patient NPO and Xarelto on hold. Note that recent Xarelto use can falsely elevate heparin level. Unfortunately baseline heparin level and aPTT were not collected.   Today, 04/21/19:  AM heparin level = 0.67 units/mL  AM aPTT = 75 seconds  CBC: Hgb 11.7, Pltc WNL  No bleeding or infusion issues noted per nursing  Goal of Therapy:  Heparin level 0.3-0.7 units/ml aPTT 66-102 seconds Monitor platelets by anticoagulation protocol: Yes   Plan:  Continue heparin infusion at 1000 units/hr Check heparin level and aPTT at 1600 Daily CBC Monitor closely for s/sx of bleeding F/u plans for PleurX catheter placement or other procedures   Lindell Spar, PharmD, BCPS Clinical Pharmacist  04/21/2019,9:36 AM     Addendum:  PM heparin level = 0.51 units/mL, aPTT = 76 seconds, both therapeutic No bleeding or  infusion issues noted per nursing   Plan:  Continue heparin infusion at 1000 units/hr  Heparin level and aPTT in AM  Daily CBC  Per IR notes, plan for PleurX catheter placement tomorrow. IV heparin infusion to be stopped 4 hours prior to procedure per Dr. Kathlene Cote. RN to clarify timing of procedure with IR, so heparin infusion can be stopped appropriately prior.    Lindell Spar, PharmD, BCPS Clinical Pharmacist  04/21/2019 5:01 PM

## 2019-04-21 NOTE — Progress Notes (Signed)
Initial Nutrition Assessment  RD working remotely.   DOCUMENTATION CODES:   Not applicable  INTERVENTION:  - will order Boost Breeze BID, each supplement provides 250 kcal and 9 grams of protein. - will order 30 mL Prostat TID, each supplement provides 100 kcal and 15 grams of protein. - will order daily multivitamin with minerals. - continue to encourage PO intakes and advance diet as medically feasible.    NUTRITION DIAGNOSIS:   Increased nutrient needs related to chronic illness, cancer and cancer related treatments as evidenced by estimated needs.   GOAL:   Patient will meet greater than or equal to 90% of their needs  MONITOR:   PO intake, Supplement acceptance, Diet advancement, Labs, Weight trends, Skin  REASON FOR ASSESSMENT:   Malnutrition Screening Tool  ASSESSMENT:   78 y.o. 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 Patient presented to the ED on 8/26 with constipation, emesis, and R-sided abdominal pain x2 days PTA. She reported being unable to keep anything down during this time frame. She had bilateral exudative pleural effusion which required thoracentesis on 7/30 and 7/31.  Diet advanced from NPO to CLD today at 23. No intakes documented since that time. RD working remotely; did not attempt to call patient as notes indicate that she is very [place]. Patient assessed remotely by this RD ~1 month ago.   Medical Oncology NP saw patient this AM and her note indicates that patient denied abdominal pain or N/V at that time. Note also indicates that patient has declined aggressive cancer treatment in the past and was agreeable to outpatient Palliative consult; plan for inpatient Palliative consult.   Per chart review, weight recorded as 154 lb yesterday and as 165 lb  today; weight today appears to be a stated weight whereas weight yesterday appears to have been taken via scale. Weight on 8/1 was 156 lb which is consistent with yesterday's weight. Weight on 04/13/18 was 180 lb which indicates 26 lb weight loss (14% body weight) in the past 1 year; not significant for time frame.   Per notes: - pancreatitis--unclear etiology, no evidence of pseudocyst on CT abdomen, lipid panel to be checked - hypercalcemia 2/2 malignancy - pulmonary embolism - pleural effusion--she is interested in Pleurx placement and plan is for placement on 8/28    Labs reviewed; Ca: 10.5 mg/dl, Phos: 2.4 mg/dl.  Medications reviewed.    NUTRITION - FOCUSED PHYSICAL EXAM:  unable to complete at this time.   Diet Order:   Diet Order            Diet NPO time specified Except for: Sips with Meds  Diet effective midnight        Diet clear liquid Room service appropriate? Yes; Fluid consistency: Thin  Diet effective now              EDUCATION NEEDS:   Not appropriate for education at this time  Skin:  Skin Assessment: Skin Integrity Issues: Skin Integrity Issues:: Stage II Stage II: bilateral buttocks  Last BM:  8/25  Height:   Ht Readings from Last 1 Encounters:  04/21/19 5\' 4"  (1.626 m)    Weight:   Wt Readings from Last 1 Encounters:  04/21/19 74.8 kg    Ideal Body Weight:  54.5 kg  BMI:  Body mass index is 28.32 kg/m.  Estimated Nutritional Needs:   Kcal:  2095-2320 kcal  Protein:  105-115 grams  Fluid:  >/= 1.7 L/day     Jarome Matin, MS, RD, LDN, [facility] Inpatient Clinical Dietitian Pager # [phone] After hours/weekend pager # [phone]

## 2019-04-21 NOTE — Progress Notes (Signed)
PROGRESS NOTE    Peggy Welch  [id number] DOB: August 14, 1941 DOA: 04/20/2019 PCP: Lucianne Lei, MD    Brief Narrative:  78 y.o. female with medical history significant of breast cancer metastases, pleural effusion metastatic, PE on Xarelto, hard of hearing, chronic hypoxia on oxygen, asthma, hepatitis C from blood transfusion, HTN Dysphasia on dysphagia 2 diet, hypercalcemia  Admitted for pancreatitis  Assessment & Plan:   Active Problems:   HTN (hypertension)   Breast cancer of lower-inner quadrant of right female breast ([facility])   Hypercalcemia of malignancy   Pulmonary embolism (HCC)   Dysphagia   Personal history of venous thrombosis and embolism   Pancreatitis   Pressure injury of skin  Present on Admission:  Pancreatitis -unclear etiology  -CT of abdomen with findings suggesting possible acute pancreatitis  no evidence of pseudocyst -per my own read of CT, stool noted along ascending colon, corresponding to location of patient's abd pain -Pt reports BM overnight with much improvement in abd discomfort -Advancing diet as tolerated     HTN (hypertension)  -BP seems stable at this time   Breast cancer of lower-inner quadrant of right female breast [facility])  -appreciate input by Oncology. Recommendation for Pleur-x, consideration for zometa. Pt had been referred to Palliative Care as outpatient   Hypercalcemia of malignancy - -Given hydration overnight -Ca improved from 12.5 to 10.5   Pulmonary embolism (HCC)  -bridged with heparin for now, while awaiting Pleurx -Per Oncology, plan to resume xarelto following procedure  Pleural effusion  -patient is interested in Pleurx catheter placement, was recommended by Oncology -IR consulted, plan for Pleurx placement 8/28  DVT prophylaxis: heparin gtt Code Status: DNR Family Communication: Pt in room, family not at bedside Disposition Plan: Uncertain at this time  Consultants:   Oncology  IR  Procedures:       Antimicrobials: Anti-infectives (From admission, onward)   None       Subjective: Reports feeling better today after having bowel movement overnight  Objective: Vitals:   04/20/19 2337 04/21/19 0635 04/21/19 0832 04/21/19 1424  BP: 134/90 123/77  (!) 105/59  Pulse: (!) 103 65 92 (!) 101  Resp: 18 18  16   Temp: 98.6 F (37 C) 98.7 F (37.1 C)  98.4 F (36.9 C)  TempSrc: Oral Oral  Oral  SpO2: 97% 98% 94% 99%  Weight:  74.8 kg    Height:  5\' 4"  (1.626 m)      Intake/Output Summary (Last 24 hours) at 04/21/2019 1642 Last data filed at 04/21/2019 1537 Gross per 24 hour  Intake 1549.14 ml  Output 0 ml  Net 1549.14 ml   Filed Weights   04/20/19 1726 04/21/19 0635  Weight: 70 kg 74.8 kg    Examination:  General exam: Appears calm and comfortable  Respiratory system: Clear to auscultation. Respiratory effort normal. Cardiovascular system: S1 & S2 heard, RRR Gastrointestinal system: Abdomen is nondistended, soft and nontender. No organomegaly or masses felt. Normal bowel sounds heard. Central nervous system: Alert and oriented. No focal neurological deficits. Extremities: Symmetric 5 x 5 power. Skin: No rashes, lesions Psychiatry: Judgement and insight appear normal. Mood & affect appropriate.   Data Reviewed: I have personally reviewed following labs and imaging studies  CBC: Recent Labs  Lab 04/20/19 1749 04/21/19 0748  WBC 8.1 10.5  HGB 12.6 11.7*  HCT 41.0 37.9  MCV 99.3 101.3*  PLT 345 [id number]   Basic Metabolic Panel: Recent Labs  Lab 04/20/19 1749 04/21/19 0748  NA 139  138  K 4.6 3.9  CL 103 108  CO2 29 24  GLUCOSE 138* 98  BUN 13 12  CREATININE 0.62 0.59  CALCIUM 12.5* 10.5*  MG  --  2.3  PHOS  --  2.4*   GFR: Estimated Creatinine Clearance: 56.4 mL/min (by C-G formula based on SCr of 0.59 mg/dL). Liver Function Tests: Recent Labs  Lab 04/20/19 1749 04/21/19 0748  AST 59* 40  ALT 25 18  ALKPHOS 112 85  BILITOT 0.8 0.9  PROT 7.1  5.9*  ALBUMIN 3.5 2.8*   Recent Labs  Lab 04/20/19 1749  LIPASE 185*   No results for input(s): AMMONIA in the last 168 hours. Coagulation Profile: No results for input(s): INR, PROTIME in the last 168 hours. Cardiac Enzymes: No results for input(s): CKTOTAL, CKMB, CKMBINDEX, TROPONINI in the last 168 hours. BNP (last 3 results) No results for input(s): PROBNP in the last 8760 hours. HbA1C: No results for input(s): HGBA1C in the last 72 hours. CBG: No results for input(s): GLUCAP in the last 168 hours. Lipid Profile: Recent Labs    04/21/19 0748  CHOL 128  HDL 48  LDLCALC 70  TRIG 51  CHOLHDL 2.7   Thyroid Function Tests: Recent Labs    04/21/19 0748  TSH 0.652   Anemia Panel: No results for input(s): VITAMINB12, FOLATE, FERRITIN, TIBC, IRON, RETICCTPCT in the last 72 hours. Sepsis Labs: No results for input(s): PROCALCITON, LATICACIDVEN in the last 168 hours.  Recent Results (from the past 240 hour(s))  SARS CORONAVIRUS 2 (TAT 6-12 HRS) Nasal Swab Aptima Multi Swab     Status: None   Collection Time: 04/20/19  8:20 PM   Specimen: Aptima Multi Swab; Nasal Swab  Result Value Ref Range Status   SARS Coronavirus 2 NEGATIVE NEGATIVE Final    Comment: (NOTE) SARS-CoV-2 target nucleic acids are NOT DETECTED. The SARS-CoV-2 RNA is generally detectable in upper and lower respiratory specimens during the acute phase of infection. Negative results do not preclude SARS-CoV-2 infection, do not rule out co-infections with other pathogens, and should not be used as the sole basis for treatment or other patient management decisions. Negative results must be combined with clinical observations, patient history, and epidemiological information. The expected result is Negative. Fact Sheet for Patients: SugarRoll.be Fact Sheet for Healthcare Providers: https://www.woods-mathews.com/ This test is not yet approved or cleared by the [place] FDA and  has been authorized for detection and/or diagnosis of SARS-CoV-2 by FDA under an Emergency Use Authorization (EUA). This EUA will remain  in effect (meaning this test can be used) for the duration of the COVID-19 declaration under Section 56 4(b)(1) of the Act, 21 U.S.C. section 360bbb-3(b)(1), unless the authorization is terminated or revoked sooner. Performed at [facility], [place] [address], [place], [place] [address]      Radiology Studies: Ct Chest W Contrast  Result Date: 04/20/2019 CLINICAL DATA:  78 year old female with known right-sided breast cancer, right-sided abdominal pain and vomiting. EXAM: CT CHEST, ABDOMEN, AND PELVIS WITH CONTRAST TECHNIQUE: Multidetector CT imaging of the chest, abdomen and pelvis was performed following the standard protocol during bolus administration of intravenous contrast. CONTRAST:  161mL OMNIPAQUE IOHEXOL 300 MG/ML  SOLN COMPARISON:  Prior CT scan of the chest 03/22/2019; prior CT scan of the abdomen and pelvis 12/14/2017 FINDINGS: CT CHEST FINDINGS Cardiovascular: The heart is normal in size. No pericardial effusion. Calcifications visualized along the coronary arteries. No significant aortic aneurysm or evidence of dissection. The main  pulmonary artery is normal in caliber and relatively well opacified. No central pulmonary embolus. Mediastinum/Nodes: Dystrophic calcification in the left adrenal gland. No definite mediastinal or hilar lymph nodes. Lungs/Pleura: Large bilateral pleural effusions with associated significant lower lobe atelectasis bilaterally. The aerated portions of the lungs remain clear. No suspicious pulmonary mass or nodule visualized. Musculoskeletal: No acute fracture or aggressive appearing lytic or blastic osseous lesion. The right breast and chest wall are essentially completely replaced by extensive irregular soft tissue. There is marked skin thickening of the overlying breast. Soft tissue irregularity extends  into the axilla and appears to encase the neurovascular bundle. Questionable involvement of the lateral aspect of the right first rib. CT ABDOMEN PELVIS FINDINGS Hepatobiliary: No focal liver abnormality is seen. Status post cholecystectomy. No biliary dilatation. Pancreas: The pancreatic head and uncinate process appear slightly indistinct, particularly compared to prior imaging. There is mild peripancreatic soft tissue stranding. Spleen: Normal in size without focal abnormality. Adrenals/Urinary Tract: The adrenal glands are unremarkable. No hydronephrosis or enhancing renal mass. No nephrolithiasis. Multiple circumscribed water attenuation simple cysts in the right kidney. Stomach/Bowel: No focal bowel wall thickening or evidence of obstruction. Vascular/Lymphatic: No aneurysm or dissection. Scattered atherosclerotic vascular calcifications. The abdominal aorta is tortuous. No suspicious lymphadenopathy. Reproductive: Dystrophic calcified uterine fibroids. No adnexal masses. Other: Trace fluid in the region of the hepato duodenal ligament. Small amount of fluid tracks along the anterior pararenal fascia on the right. Musculoskeletal: No acute fracture or aggressive appearing lytic or blastic osseous lesion. Multilevel degenerative disc disease. IMPRESSION: CT CHEST 1. Continued progression of primary right breast cancer with extensive involvement of the right chest wall, axilla and right axillary neurovascular bundle. 2. Large bilateral pleural effusions with associated significant compressive atelectasis of both lower lobes. 3. Aortic and coronary artery calcifications. Aortic Atherosclerosis (ICD10-170.0). CT ABD/PELVIS 1. CT findings are suspicious for acute pancreatitis with indistinct and edematous pancreatic head and uncinate process and adjacent peripancreatic inflammatory stranding and trace fluid. 2. Additional ancillary findings as above. Electronically Signed   By: Jacqulynn Cadet M.D.   On:  04/20/2019 19:51   Ct Abdomen Pelvis W Contrast  Result Date: 04/20/2019 CLINICAL DATA:  78 year old female with known right-sided breast cancer, right-sided abdominal pain and vomiting. EXAM: CT CHEST, ABDOMEN, AND PELVIS WITH CONTRAST TECHNIQUE: Multidetector CT imaging of the chest, abdomen and pelvis was performed following the standard protocol during bolus administration of intravenous contrast. CONTRAST:  183mL OMNIPAQUE IOHEXOL 300 MG/ML  SOLN COMPARISON:  Prior CT scan of the chest 03/22/2019; prior CT scan of the abdomen and pelvis 12/14/2017 FINDINGS: CT CHEST FINDINGS Cardiovascular: The heart is normal in size. No pericardial effusion. Calcifications visualized along the coronary arteries. No significant aortic aneurysm or evidence of dissection. The main pulmonary artery is normal in caliber and relatively well opacified. No central pulmonary embolus. Mediastinum/Nodes: Dystrophic calcification in the left adrenal gland. No definite mediastinal or hilar lymph nodes. Lungs/Pleura: Large bilateral pleural effusions with associated significant lower lobe atelectasis bilaterally. The aerated portions of the lungs remain clear. No suspicious pulmonary mass or nodule visualized. Musculoskeletal: No acute fracture or aggressive appearing lytic or blastic osseous lesion. The right breast and chest wall are essentially completely replaced by extensive irregular soft tissue. There is marked skin thickening of the overlying breast. Soft tissue irregularity extends into the axilla and appears to encase the neurovascular bundle. Questionable involvement of the lateral aspect of the right first rib. CT ABDOMEN PELVIS FINDINGS Hepatobiliary: No focal liver abnormality is  seen. Status post cholecystectomy. No biliary dilatation. Pancreas: The pancreatic head and uncinate process appear slightly indistinct, particularly compared to prior imaging. There is mild peripancreatic soft tissue stranding. Spleen: Normal in  size without focal abnormality. Adrenals/Urinary Tract: The adrenal glands are unremarkable. No hydronephrosis or enhancing renal mass. No nephrolithiasis. Multiple circumscribed water attenuation simple cysts in the right kidney. Stomach/Bowel: No focal bowel wall thickening or evidence of obstruction. Vascular/Lymphatic: No aneurysm or dissection. Scattered atherosclerotic vascular calcifications. The abdominal aorta is tortuous. No suspicious lymphadenopathy. Reproductive: Dystrophic calcified uterine fibroids. No adnexal masses. Other: Trace fluid in the region of the hepato duodenal ligament. Small amount of fluid tracks along the anterior pararenal fascia on the right. Musculoskeletal: No acute fracture or aggressive appearing lytic or blastic osseous lesion. Multilevel degenerative disc disease. IMPRESSION: CT CHEST 1. Continued progression of primary right breast cancer with extensive involvement of the right chest wall, axilla and right axillary neurovascular bundle. 2. Large bilateral pleural effusions with associated significant compressive atelectasis of both lower lobes. 3. Aortic and coronary artery calcifications. Aortic Atherosclerosis (ICD10-170.0). CT ABD/PELVIS 1. CT findings are suspicious for acute pancreatitis with indistinct and edematous pancreatic head and uncinate process and adjacent peripancreatic inflammatory stranding and trace fluid. 2. Additional ancillary findings as above. Electronically Signed   By: Jacqulynn Cadet M.D.   On: 04/20/2019 19:51   Dg Chest Port 1 View  Result Date: 04/20/2019 CLINICAL DATA:  78 year old female with shortness of breath EXAM: PORTABLE CHEST 1 VIEW COMPARISON:  Prior chest x-ray 04/14/2019 FINDINGS: Small to moderate bilateral layering pleural effusions larger on the left than the right. The cardiac contours are largely obscured. Suspect underlying cardiomegaly. Atherosclerotic calcifications present in the transverse aorta. Pulmonary vascular  congestion is present. No evidence of pneumothorax. No acute osseous abnormality. Degenerative changes at the left glenohumeral joint. Soft tissue anchors in the right humeral head from prior rotator cuff repair. IMPRESSION: 1. Small to moderate bilateral layering pleural effusions, larger on the left than the right. 2. Pulmonary vascular congestion with mild pulmonary edema. 3.  Aortic Atherosclerosis (ICD10-170.0) Electronically Signed   By: Jacqulynn Cadet M.D.   On: 04/20/2019 18:49    Scheduled Meds:  feeding supplement  1 Container Oral BID BM   feeding supplement (PRO-STAT SUGAR FREE 64)  30 mL Oral TID BM   multivitamin with minerals  1 tablet Oral Daily   Continuous Infusions:  heparin 1,000 Units/hr (04/20/19 2357)     LOS: 1 day   Marylu Lund, MD [facility] Pager On Amion  If 7PM-7AM, please contact night-coverage 04/21/2019, 4:42 PM

## 2019-04-21 NOTE — Progress Notes (Addendum)
HEMATOLOGY-ONCOLOGY PROGRESS NOTE  SUBJECTIVE: [name] is now admitted for right-sided abdominal pain, constipation, emesis.  On admission, a CT of the chest, abdomen, pelvis was obtained which showed continued progression of her right breast cancer with extensive involvement of the right chest wall, axilla, and right axillary neurovascular bundle, large bilateral pleural effusions with associated significant compressive atelectasis of both lower lobes, findings suspicious for acute pancreatitis with indistinct and edematous pancreatic head and uncinate process and adjacent peri-pancreatic inflammatory stranding and trace fluid.  Lipase elevated at 185.  She was started on IV fluids and kept n.p.o.  Additionally, her calcium level on admission was elevated at 12.5.  The patient has had recurrent pleural effusions and Pleurx catheter placement has been requested by interventional radiology.  When seen today, the patient is very hard of hearing.  It was difficult to obtain a review of systems.  She does state that she does not have any abdominal discomfort this morning reports that her breathing is stable.  She is not currently having any nausea or vomiting.  She has no other complaints today.  Remains afebrile and other vital signs are stable this morning.   REVIEW OF SYSTEMS:   As noted in the HPI  I have reviewed the past medical history, past surgical history, social history and family history with the patient and they are unchanged from previous note.   PHYSICAL EXAMINATION: ECOG PERFORMANCE STATUS: 2 - Symptomatic, <50% confined to bed  Vitals:   04/21/19 0635 04/21/19 0832  BP: 123/77   Pulse: 65 92  Resp: 18   Temp: 98.7 F (37.1 C)   SpO2: 98% 94%   Filed Weights   04/20/19 1726 04/21/19 0635  Weight: 154 lb 5.2 oz (70 kg) 165 lb (74.8 kg)    Intake/Output from previous day: 08/26 0701 - 08/27 0700 In: 694.4 [I.V.:694.4] Out: -   GENERAL:alert, no distress and  comfortable, extremely hard of hearing SKIN: skin color, texture, turgor are normal, no rashes or significant lesions EYES: normal, Conjunctiva are pink and non-injected, sclera clear OROPHARYNX:no exudate, no erythema and lips, buccal mucosa, and tongue normal  NECK: supple, thyroid normal size, non-tender, without nodularity LYMPH:  no palpable lymphadenopathy in the cervical, axillary or inguinal LUNGS: Diminished breath sounds, normal breathing effort HEART: regular rate & rhythm and no murmurs and no lower extremity edema ABDOMEN:abdomen soft, non-tender and normal bowel sounds Musculoskeletal:no cyanosis of digits and no clubbing  NEURO: alert & oriented x 3 with fluent speech, no focal motor/sensory deficits  LABORATORY DATA:  I have reviewed the data as listed CMP Latest Ref Rng & Units 04/21/2019 04/20/2019 04/10/2019  Glucose 70 - 99 mg/dL 98 138(H) 105(H)  BUN 8 - 23 mg/dL 12 13 8   Creatinine 0.44 - 1.00 mg/dL 0.59 0.62 0.63  Sodium 135 - 145 mmol/L 138 139 139  Potassium 3.5 - 5.1 mmol/L 3.9 4.6 3.9  Chloride 98 - 111 mmol/L 108 103 105  CO2 22 - 32 mmol/L 24 29 29   Calcium 8.9 - 10.3 mg/dL 10.5(H) 12.5(H) 11.7(H)  Total Protein 6.5 - 8.1 g/dL 5.9(L) 7.1 -  Total Bilirubin 0.3 - 1.2 mg/dL 0.9 0.8 -  Alkaline Phos 38 - 126 U/L 85 112 -  AST 15 - 41 U/L 40 59(H) -  ALT 0 - 44 U/L 18 25 -    Lab Results  Component Value Date   WBC 10.5 04/21/2019   HGB 11.7 (L) 04/21/2019   HCT 37.9 04/21/2019  MCV 101.3 (H) 04/21/2019   PLT 316 04/21/2019   NEUTROABS 3.2 04/07/2019    Dg Chest 1 View  Result Date: 04/14/2019 CLINICAL DATA:  78 year old female with a history of malignant effusion, status post thoracentesis EXAM: CHEST  1 VIEW COMPARISON:  04/10/2019 FINDINGS: Cardiomediastinal silhouette unchanged in size and contour, partially obscured by overlying lung and pleural disease. Improved aeration of the right lung status post thoracentesis with decreased pleural fluid.  Partial obscuration the right hemidiaphragm. Persisting opacity at the left lung base with partial obscuration left hemidiaphragm and the left heart border. No pneumothorax. IMPRESSION: Decreased right-sided pleural fluid status post thoracentesis with no visualized pneumothorax. Small left pleural effusion with associated atelectasis/consolidation. Electronically Signed   By: Corrie Mckusick D.O.   On: 04/14/2019 10:59   Dg Chest 1 View  Result Date: 04/10/2019 CLINICAL DATA:  Status post left-sided thoracentesis. EXAM: CHEST  1 VIEW COMPARISON:  A 16 2020 FINDINGS: The patient has undergone left-sided thoracentesis. There is no evidence for left-sided pneumothorax. There is a small left-sided pleural effusion, substantially improved from prior study. There is a persistent moderate to large right-sided pleural effusion. The heart size is stable. There is presumed atelectasis at the lung bases. There are advanced degenerative changes of both glenohumeral joints. There is no right-sided pneumothorax. There is no definite acute osseous abnormality. IMPRESSION: 1. No pneumothorax status post left-sided thoracentesis. 2. Small residual left-sided pleural effusion, significantly improved from prior study. 3. Persistent moderate to large right-sided pleural effusion. 4. Bibasilar airspace opacities, favored to represent atelectasis. Electronically Signed   By: Constance Holster M.D.   On: 04/10/2019 17:32   Dg Chest 1 View  Result Date: 03/25/2019 CLINICAL DATA:  78 year old female with a history of thoracentesis EXAM: CHEST  1 VIEW COMPARISON:  March 24, 2019, March 23, 2019 FINDINGS: Cardiomediastinal silhouette likely unchanged in size and contour. Decreased opacity at the left lung base with no visualized pneumothorax, and improved aeration. Blunting at the right costophrenic angle persists. Degenerative changes of the shoulders. Hazy opacity of the bilateral lower lungs. IMPRESSION: Decreased left-sided pleural  fluid with no visualized pneumothorax. Persisting opacities at the bilateral lung base, including likely small trace right pleural effusion. Electronically Signed   By: Corrie Mckusick D.O.   On: 03/25/2019 15:48   Dg Chest 1 View  Result Date: 03/24/2019 CLINICAL DATA:  Status post right thoracentesis EXAM: CHEST  1 VIEW COMPARISON:  March 22, 2019 FINDINGS: There is interval resolution of right-sided pleural effusion. No pneumothorax. Streaky atelectasis seen at the right lung base. Again noted is a moderate left pleural effusion. The cardiomediastinal silhouette is unchanged with aortic knob calcifications. Degenerative changes seen at both shoulders. IMPRESSION: 1. No right-sided pneumothorax 2. Moderate left pleural effusion. Electronically Signed   By: Prudencio Pair M.D.   On: 03/24/2019 15:29   Dg Chest 2 View  Result Date: 04/10/2019 CLINICAL DATA:  Shortness of breath EXAM: CHEST - 2 VIEW COMPARISON:  03/25/2019 FINDINGS: Large bilateral pleural effusion. Most of the heart is obscured but likely enlarged. Cephalized blood flow. No visible pneumothorax. Clip at the right hilum. IMPRESSION: Large pleural effusions and pulmonary vascular congestion. Electronically Signed   By: Monte Fantasia M.D.   On: 04/10/2019 12:45   Dg Chest 2 View  Result Date: 03/22/2019 CLINICAL DATA:  78 y.o female sent over from PCP for difficulty swallowing, productive coughing, and low 02. 95% RA in triage. Denies chest pain or shob. Hx. Asthma, HTN, PNA, and rt  breast cancer EXAM: CHEST - 2 VIEW COMPARISON:  Chest CT, 12/14/2017. Chest radiographs, 11/30/2014 and earlier. FINDINGS: Moderate bilateral pleural effusions obscure the hemidiaphragms and most of the heart borders. There is additional lung base opacity consistent with atelectasis. Remainder of the lungs is clear. No pneumothorax. Cardiac silhouette is top-normal in size. No mediastinal or hilar masses. Skeletal structures are intact. IMPRESSION: 1. Moderate  bilateral pleural effusions with associated basilar atelectasis. Cannot exclude underlying pneumonia. No evidence of pulmonary edema. Electronically Signed   By: Lajean Manes M.D.   On: 03/22/2019 17:42   Ct Chest W Contrast  Result Date: 04/20/2019 CLINICAL DATA:  78 year old female with known right-sided breast cancer, right-sided abdominal pain and vomiting. EXAM: CT CHEST, ABDOMEN, AND PELVIS WITH CONTRAST TECHNIQUE: Multidetector CT imaging of the chest, abdomen and pelvis was performed following the standard protocol during bolus administration of intravenous contrast. CONTRAST:  111mL OMNIPAQUE IOHEXOL 300 MG/ML  SOLN COMPARISON:  Prior CT scan of the chest 03/22/2019; prior CT scan of the abdomen and pelvis 12/14/2017 FINDINGS: CT CHEST FINDINGS Cardiovascular: The heart is normal in size. No pericardial effusion. Calcifications visualized along the coronary arteries. No significant aortic aneurysm or evidence of dissection. The main pulmonary artery is normal in caliber and relatively well opacified. No central pulmonary embolus. Mediastinum/Nodes: Dystrophic calcification in the left adrenal gland. No definite mediastinal or hilar lymph nodes. Lungs/Pleura: Large bilateral pleural effusions with associated significant lower lobe atelectasis bilaterally. The aerated portions of the lungs remain clear. No suspicious pulmonary mass or nodule visualized. Musculoskeletal: No acute fracture or aggressive appearing lytic or blastic osseous lesion. The right breast and chest wall are essentially completely replaced by extensive irregular soft tissue. There is marked skin thickening of the overlying breast. Soft tissue irregularity extends into the axilla and appears to encase the neurovascular bundle. Questionable involvement of the lateral aspect of the right first rib. CT ABDOMEN PELVIS FINDINGS Hepatobiliary: No focal liver abnormality is seen. Status post cholecystectomy. No biliary dilatation. Pancreas:  The pancreatic head and uncinate process appear slightly indistinct, particularly compared to prior imaging. There is mild peripancreatic soft tissue stranding. Spleen: Normal in size without focal abnormality. Adrenals/Urinary Tract: The adrenal glands are unremarkable. No hydronephrosis or enhancing renal mass. No nephrolithiasis. Multiple circumscribed water attenuation simple cysts in the right kidney. Stomach/Bowel: No focal bowel wall thickening or evidence of obstruction. Vascular/Lymphatic: No aneurysm or dissection. Scattered atherosclerotic vascular calcifications. The abdominal aorta is tortuous. No suspicious lymphadenopathy. Reproductive: Dystrophic calcified uterine fibroids. No adnexal masses. Other: Trace fluid in the region of the hepato duodenal ligament. Small amount of fluid tracks along the anterior pararenal fascia on the right. Musculoskeletal: No acute fracture or aggressive appearing lytic or blastic osseous lesion. Multilevel degenerative disc disease. IMPRESSION: CT CHEST 1. Continued progression of primary right breast cancer with extensive involvement of the right chest wall, axilla and right axillary neurovascular bundle. 2. Large bilateral pleural effusions with associated significant compressive atelectasis of both lower lobes. 3. Aortic and coronary artery calcifications. Aortic Atherosclerosis (ICD10-170.0). CT ABD/PELVIS 1. CT findings are suspicious for acute pancreatitis with indistinct and edematous pancreatic head and uncinate process and adjacent peripancreatic inflammatory stranding and trace fluid. 2. Additional ancillary findings as above. Electronically Signed   By: Jacqulynn Cadet M.D.   On: 04/20/2019 19:51   Ct Angio Chest Pe W And/or Wo Contrast  Result Date: 03/22/2019 CLINICAL DATA:  Difficulty swallowing, productive cough, hypoxia. History of breast cancer. EXAM: CT ANGIOGRAPHY CHEST WITH CONTRAST  TECHNIQUE: Multidetector CT imaging of the chest was performed  using the standard protocol during bolus administration of intravenous contrast. Multiplanar CT image reconstructions and MIPs were obtained to evaluate the vascular anatomy. CONTRAST:  145mL OMNIPAQUE IOHEXOL 350 MG/ML SOLN COMPARISON:  Chest radiographs dated 03/22/2019. CT chest dated 12/14/2017. FINDINGS: Cardiovascular: Satisfactory opacification of the bilateral pulmonary arteries to the lobar level. Lobar/segmental pulmonary embolism within a branch of the right lower lobe pulmonary artery (series 8/image 117). Overall clot burden is small. No evidence of right heart strain. No evidence of thoracic aortic aneurysm or dissection. Mild atherosclerotic calcifications of the aortic arch. The heart is normal in size.  Trace pericardial fluid. Mediastinum/Nodes: No suspicious mediastinal lymphadenopathy. Visualized thyroid is unremarkable. Lungs/Pleura: Evaluation of the lung parenchyma is constrained by respiratory motion. Within that constraint, there are no suspicious pulmonary nodules. Suspected mild perihilar edema in the bilateral upper lobes. Moderate to large bilateral pleural effusions. Associated areas of scattered atelectasis in the bilateral upper lobes with dependent atelectasis in the bilateral lower lobes. No pneumothorax. Upper Abdomen: Visualized upper abdomen is notable for prior cholecystectomy. Musculoskeletal: Abnormal soft tissue throughout the right breast and extending to the right axilla with involvement of the right pectoralis musculature (series 7/image 27), measuring approximately 4.5 x 14.5 cm in axial dimension. While some of this appearance may reflect radiation change, the overall appearance is considered concerning for infiltrating tumor which has progressed from the prior. Overlying skin thickening. Degenerative changes of the visualized thoracolumbar spine. Review of the MIP images confirms the above findings. IMPRESSION: Lobar/segmental pulmonary embolism within a branch of the  right lower lobe pulmonary artery. Overall clot burden is small. No evidence of right heart strain. Mild interstitial edema. Moderate to large bilateral pleural effusions. Dependent atelectasis in the bilateral lower lobes. Abnormal soft tissue throughout the right breast, extending to the right axilla, and involving the right pectoralis musculature. This appearance is concerning for infiltrating tumor which has progressed from the prior. Overlying skin thickening. These results were called by telephone at the time of interpretation on 03/22/2019 at 8:48 pm to Dr. Gareth Morgan, who verbally acknowledged these results. Aortic Atherosclerosis (ICD10-I70.0). Electronically Signed   By: Julian Hy M.D.   On: 03/22/2019 20:49   Ct Abdomen Pelvis W Contrast  Result Date: 04/20/2019 CLINICAL DATA:  78 year old female with known right-sided breast cancer, right-sided abdominal pain and vomiting. EXAM: CT CHEST, ABDOMEN, AND PELVIS WITH CONTRAST TECHNIQUE: Multidetector CT imaging of the chest, abdomen and pelvis was performed following the standard protocol during bolus administration of intravenous contrast. CONTRAST:  170mL OMNIPAQUE IOHEXOL 300 MG/ML  SOLN COMPARISON:  Prior CT scan of the chest 03/22/2019; prior CT scan of the abdomen and pelvis 12/14/2017 FINDINGS: CT CHEST FINDINGS Cardiovascular: The heart is normal in size. No pericardial effusion. Calcifications visualized along the coronary arteries. No significant aortic aneurysm or evidence of dissection. The main pulmonary artery is normal in caliber and relatively well opacified. No central pulmonary embolus. Mediastinum/Nodes: Dystrophic calcification in the left adrenal gland. No definite mediastinal or hilar lymph nodes. Lungs/Pleura: Large bilateral pleural effusions with associated significant lower lobe atelectasis bilaterally. The aerated portions of the lungs remain clear. No suspicious pulmonary mass or nodule visualized. Musculoskeletal:  No acute fracture or aggressive appearing lytic or blastic osseous lesion. The right breast and chest wall are essentially completely replaced by extensive irregular soft tissue. There is marked skin thickening of the overlying breast. Soft tissue irregularity extends into the axilla and appears to  encase the neurovascular bundle. Questionable involvement of the lateral aspect of the right first rib. CT ABDOMEN PELVIS FINDINGS Hepatobiliary: No focal liver abnormality is seen. Status post cholecystectomy. No biliary dilatation. Pancreas: The pancreatic head and uncinate process appear slightly indistinct, particularly compared to prior imaging. There is mild peripancreatic soft tissue stranding. Spleen: Normal in size without focal abnormality. Adrenals/Urinary Tract: The adrenal glands are unremarkable. No hydronephrosis or enhancing renal mass. No nephrolithiasis. Multiple circumscribed water attenuation simple cysts in the right kidney. Stomach/Bowel: No focal bowel wall thickening or evidence of obstruction. Vascular/Lymphatic: No aneurysm or dissection. Scattered atherosclerotic vascular calcifications. The abdominal aorta is tortuous. No suspicious lymphadenopathy. Reproductive: Dystrophic calcified uterine fibroids. No adnexal masses. Other: Trace fluid in the region of the hepato duodenal ligament. Small amount of fluid tracks along the anterior pararenal fascia on the right. Musculoskeletal: No acute fracture or aggressive appearing lytic or blastic osseous lesion. Multilevel degenerative disc disease. IMPRESSION: CT CHEST 1. Continued progression of primary right breast cancer with extensive involvement of the right chest wall, axilla and right axillary neurovascular bundle. 2. Large bilateral pleural effusions with associated significant compressive atelectasis of both lower lobes. 3. Aortic and coronary artery calcifications. Aortic Atherosclerosis (ICD10-170.0). CT ABD/PELVIS 1. CT findings are  suspicious for acute pancreatitis with indistinct and edematous pancreatic head and uncinate process and adjacent peripancreatic inflammatory stranding and trace fluid. 2. Additional ancillary findings as above. Electronically Signed   By: Jacqulynn Cadet M.D.   On: 04/20/2019 19:51   Dg Chest Port 1 View  Result Date: 04/20/2019 CLINICAL DATA:  78 year old female with shortness of breath EXAM: PORTABLE CHEST 1 VIEW COMPARISON:  Prior chest x-ray 04/14/2019 FINDINGS: Small to moderate bilateral layering pleural effusions larger on the left than the right. The cardiac contours are largely obscured. Suspect underlying cardiomegaly. Atherosclerotic calcifications present in the transverse aorta. Pulmonary vascular congestion is present. No evidence of pneumothorax. No acute osseous abnormality. Degenerative changes at the left glenohumeral joint. Soft tissue anchors in the right humeral head from prior rotator cuff repair. IMPRESSION: 1. Small to moderate bilateral layering pleural effusions, larger on the left than the right. 2. Pulmonary vascular congestion with mild pulmonary edema. 3.  Aortic Atherosclerosis (ICD10-170.0) Electronically Signed   By: Jacqulynn Cadet M.D.   On: 04/20/2019 18:49   Dg Swallowing Func-speech Pathology  Result Date: 03/23/2019 Objective Swallowing Evaluation: Type of Study: MBS-Modified Barium Swallow Study  Patient Details Name: RAHEL MOTTOLA MRN: [id number] Date of Birth: [birth year] Today's Date: 03/23/2019 Time: SLP Start Time (ACUTE ONLY): 0810 -SLP Stop Time (ACUTE ONLY): 0840 SLP Time Calculation (min) (ACUTE ONLY): 30 min Past Medical History: Past Medical History: Diagnosis Date . Arthritis  . Asthma  . Asthma exacerbation 10/14/2011 . Blood transfusion without reported diagnosis  . Hepatitis C   from blood transfusion . Hypertension  . Pneumonia 4/02016 . rt breast ca dx'd 2019 Past Surgical History: Past Surgical History: Procedure Laterality Date . CHOLECYSTECTOMY   02/21/2002 . JOINT REPLACEMENT    Bilateral total knee replacements . knee replacements Bilateral  . Right Rotator Cuff Repair  08/12/2006 . TOTAL HIP ARTHROPLASTY Right 11/22/2014  dr Lorin Mercy . TOTAL HIP ARTHROPLASTY Right 11/22/2014  Procedure: TOTAL HIP ARTHROPLASTY ANTERIOR APPROACH;  Surgeon: Marybelle Killings, MD;  Location: [facility];  Service: Orthopedics;  Laterality: Right; HPI: pt is a 78 yo female with h/o breast cancer admitted to [facility] with dysphagia.  Pt found to have hypoxia also.  MBS ordered.  Pt reports  dysphagia clinically with symptoms including coughing on water immediately after swallowing.  Pt CT chest showed "Abnormal soft tissue throughout the right breast and extending to the right axilla with involvement of the right pectoralis musculature (series 7/image 27), measuring approximately 4.5 x 14.5 cm in axial dimension. While some of this appearance may reflect radiation change, the overall appearance is considered concerning for infiltrating tumor which has progressed from prior".  Pt is also positive for pulmonary embolism.  She also has bilateral pleural effusions.  Per MD notes, pt has not followed oncology for one year and does not want further treatment.  Subjective: pt awake in chair, very HOH Assessment / Plan / Recommendation CHL IP CLINICAL IMPRESSIONS 03/23/2019 Clinical Impression Patient presents with mild pharyngeal dysphagia with motor impairments but no aspiration.  Pt's dysphagia marked by decreased timing of laryngeal closure which allows consistent laryngeal penetration of thin liquids and pharyngeal retention clearance.  She is however protective of her airway and independently conducts dry swallows with breath hold to clear larynx/pharynx of liquid residual.  Indequate epiglottic deflection negatively impacts solids/puree pharyngeal clearance although pt is sensate.  Reflexive dry swallow decreased solid/puree residuals and liquid "wash" helpful to clear pharynx.  Postures attempted  (head turn right and chin tuck) although neck ROM very limited thus not adequately performed nor were they helpful to prevent residuals or decrease amount of laryngeal penetration.  Pt with increased aspiration risk with liquid using chin tuck as it contributes to spillage of residuals from pyriform sinus into larynx.  Pt is compensating well for her dysphagia without airway infiltration.  Recommend pt maximize her liquid nutritional intake as liquids clear pharynx easier than solids.  Pt reports she has been drinking liquid supplements at home.  Recommend dys3/thin diet with strict aspiration precautions.  Given pt reports xerostomia, recommend start all po with liquids.  Question if pt's tumor progression may be impacting her pharyngeal and esophageal swallowing ability.  Will follow up for pt education to compensation strategies.  Note per review of chart, she did not want further treatment of her breast cancer thus compensation for dysphagia is likely optimal route for her goals.Of note, pt did cough x1 during MBS but there was not barium visualized in her trachea. SLP Visit Diagnosis Dysphagia, pharyngoesophageal phase (R13.14) Attention and concentration deficit following -- Frontal lobe and executive function deficit following -- Impact on safety and function Mild aspiration risk   CHL IP TREATMENT RECOMMENDATION 12/04/2014 Treatment Recommendations No treatment recommended at this time   No flowsheet data found. CHL IP DIET RECOMMENDATION 03/23/2019 SLP Diet Recommendations Dysphagia 3 (Mech soft) solids;Thin liquid Liquid Administration via Cup;Straw Medication Administration Crushed with puree Compensations Slow rate;Small sips/bites;Multiple dry swallows after each bite/sip;Follow solids with liquid Postural Changes Remain semi-upright after after feeds/meals (Comment);Seated upright at 90 degrees   CHL IP OTHER RECOMMENDATIONS 03/23/2019 Recommended Consults -- Oral Care Recommendations Oral care BID Other  Recommendations --   CHL IP FOLLOW UP RECOMMENDATIONS 12/04/2014 Follow up Recommendations None   CHL IP FREQUENCY AND DURATION 03/23/2019 Speech Therapy Frequency (ACUTE ONLY) min 1 x/week Treatment Duration 1 week      CHL IP ORAL PHASE 03/23/2019 Oral Phase Impaired Oral - Pudding Teaspoon -- Oral - Pudding Cup -- Oral - Honey Teaspoon -- Oral - Honey Cup -- Oral - Nectar Teaspoon -- Oral - Nectar Cup WFL;Piecemeal swallowing Oral - Nectar Straw -- Oral - Thin Teaspoon WFL;Piecemeal swallowing Oral - Thin Cup [facility];Piecemeal swallowing Oral - Thin Straw  Piecemeal swallowing;WFL Oral - Puree Lingual pumping Oral - Mech Soft WFL Oral - Regular -- Oral - Multi-Consistency -- Oral - Pill -- Oral Phase - Comment --  CHL IP PHARYNGEAL PHASE 03/23/2019 Pharyngeal Phase -- Pharyngeal- Pudding Teaspoon -- Pharyngeal -- Pharyngeal- Pudding Cup -- Pharyngeal -- Pharyngeal- Honey Teaspoon -- Pharyngeal -- Pharyngeal- Honey Cup -- Pharyngeal -- Pharyngeal- Nectar Teaspoon -- Pharyngeal -- Pharyngeal- Nectar Cup -- Pharyngeal -- Pharyngeal- Nectar Straw -- Pharyngeal -- Pharyngeal- Thin Teaspoon WFL Pharyngeal Material does not enter airway Pharyngeal- Thin Cup Reduced airway/laryngeal closure;Penetration/Aspiration during swallow;Penetration/Aspiration before swallow;Pharyngeal residue - pyriform;Pharyngeal residue - valleculae Pharyngeal Material enters airway, remains ABOVE vocal cords then ejected out Pharyngeal- Thin Straw Reduced airway/laryngeal closure;Pharyngeal residue - valleculae;Pharyngeal residue - pyriform Pharyngeal Material enters airway, remains ABOVE vocal cords then ejected out Pharyngeal- Puree Reduced epiglottic inversion;Reduced tongue base retraction;Pharyngeal residue - valleculae;Pharyngeal residue - pyriform Pharyngeal Material does not enter airway Pharyngeal- Mechanical Soft Reduced tongue base retraction;Reduced epiglottic inversion;Pharyngeal residue - valleculae;Pharyngeal residue - pyriform  Pharyngeal Material does not enter airway Pharyngeal- Regular -- Pharyngeal -- Pharyngeal- Multi-consistency -- Pharyngeal -- Pharyngeal- Pill -- Pharyngeal -- Pharyngeal Comment pt with minimal neck ROM and thus chin tuck nor head turn right were nof performed adequately and they were not helpful in preventing laryngeal penetration or decreasing pharyngeal stasis; reflexive dry swallows with breath hold with liquids protective of her airway and liquid swallow post-swallow of food decreased pharyngeal residuals  CHL IP CERVICAL ESOPHAGEAL PHASE 03/23/2019 Cervical Esophageal Phase Impaired Pudding Teaspoon -- Pudding Cup -- Honey Teaspoon -- Honey Cup -- Nectar Teaspoon -- Nectar Cup -- Nectar Straw -- Thin Teaspoon -- Thin Cup -- Thin Straw -- Puree -- Mechanical Soft -- Regular -- Multi-consistency -- Pill -- Cervical Esophageal Comment With puree, pt appeared with halting below CP but this did clear with liquids.  Upon esophageal sweep, pt appeared with minimal esophagus - suspect WFL.  Radiologist not present to confirm findings. Luanna Salk, MS [facility] SLP Acute Rehab Services Pager [phone] Office [phone] Macario Golds 03/23/2019, 11:08 AM              Vas [place] Lower Extremity Venous (dvt)  Result Date: 03/25/2019  Lower Venous Study Indications: Pulmonary embolism.  Comparison Study: no prior Performing Technologist: Abram Sander RVS  Examination Guidelines: A complete evaluation includes B-mode imaging, spectral Doppler, color Doppler, and power Doppler as needed of all accessible portions of each vessel. Bilateral testing is considered an integral part of a complete examination. Limited examinations for reoccurring indications may be performed as noted.  +---------+---------------+---------+-----------+----------+-------+ RIGHT    CompressibilityPhasicitySpontaneityPropertiesSummary +---------+---------------+---------+-----------+----------+-------+ CFV      Full           Yes       Yes                          +---------+---------------+---------+-----------+----------+-------+ SFJ      Full                                                 +---------+---------------+---------+-----------+----------+-------+ FV Prox  Full                                                 +---------+---------------+---------+-----------+----------+-------+  FV Mid   Full                                                 +---------+---------------+---------+-----------+----------+-------+ FV DistalFull                                                 +---------+---------------+---------+-----------+----------+-------+ PFV      Full                                                 +---------+---------------+---------+-----------+----------+-------+ POP      Full           Yes      Yes                          +---------+---------------+---------+-----------+----------+-------+ PTV      Full                                                 +---------+---------------+---------+-----------+----------+-------+ PERO     Full                                                 +---------+---------------+---------+-----------+----------+-------+   +---------+---------------+---------+-----------+----------+--------------+ LEFT     CompressibilityPhasicitySpontaneityPropertiesSummary        +---------+---------------+---------+-----------+----------+--------------+ CFV      Full           Yes      Yes                                 +---------+---------------+---------+-----------+----------+--------------+ SFJ      Full                                                        +---------+---------------+---------+-----------+----------+--------------+ FV Prox  Full                                                        +---------+---------------+---------+-----------+----------+--------------+ FV Mid   Full                                                         +---------+---------------+---------+-----------+----------+--------------+ FV DistalFull                                                        +---------+---------------+---------+-----------+----------+--------------+  PFV      Full                                                        +---------+---------------+---------+-----------+----------+--------------+ POP      Full           Yes      Yes                                 +---------+---------------+---------+-----------+----------+--------------+ PTV      Full                                                        +---------+---------------+---------+-----------+----------+--------------+ PERO                                                  Not visualized +---------+---------------+---------+-----------+----------+--------------+     Summary: Right: There is no evidence of deep vein thrombosis in the lower extremity. Left: No evidence of deep vein thrombosis in the lower extremity.  *See table(s) above for measurements and observations. Electronically signed by Curt Jews MD on 03/25/2019 at 1:51:23 PM.    Final    US Thoracentesis Asp Pleural Space W/img Guide  Result Date: 04/14/2019 INDICATION: Patient with history of metastatic breast cancer, dyspnea, and recurrent bilateral pleural effusions. Request is made for diagnostic and therapeutic right thoracentesis. EXAM: ULTRASOUND GUIDED DIAGNOSTIC AND THERAPEUTIC RIGHT THORACENTESIS MEDICATIONS: 10 mL 1% lidocaine COMPLICATIONS: None immediate. PROCEDURE: An ultrasound guided thoracentesis was thoroughly discussed with the patient and questions answered. The benefits, risks, alternatives and complications were also discussed. The patient understands and wishes to proceed with the procedure. Written consent was obtained. Ultrasound was performed to localize and mark an adequate pocket of fluid in the right chest. The area was then prepped and draped  in the normal sterile fashion. 1% Lidocaine was used for local anesthesia. Under ultrasound guidance a 6 Fr Safe-T-Centesis catheter was introduced. Thoracentesis was performed. The catheter was removed and a dressing applied. FINDINGS: A total of approximately 1.1 L of clear gold fluid was removed. Procedure was stopped after 1.1 L due to patient coughing. IMPRESSION: Successful ultrasound guided right thoracentesis yielding 1.1 L of pleural fluid. Read by: Earley Abide, PA-C Electronically Signed   By: Corrie Mckusick D.O.   On: 04/14/2019 10:58   US Thoracentesis Asp Pleural Space W/img Guide  Result Date: 04/10/2019 INDICATION: Patient with history of metastatic breast cancer, dyspnea, bilateral pleural effusions. Request made for therapeutic left thoracentesis. EXAM: ULTRASOUND GUIDED THERAPEUTIC LEFT THORACENTESIS MEDICATIONS: None COMPLICATIONS: None immediate. PROCEDURE: An ultrasound guided thoracentesis was thoroughly discussed with the patient and questions answered. The benefits, risks, alternatives and complications were also discussed. The patient understands and wishes to proceed with the procedure. Written consent was obtained. Ultrasound was performed to localize and mark an adequate pocket of fluid in the left chest. The area was then prepped and draped in the normal sterile fashion. 1% Lidocaine was used  for local anesthesia. Under ultrasound guidance a 6 Fr Safe-T-Centesis catheter was introduced. Thoracentesis was performed. The catheter was removed and a dressing applied. FINDINGS: A total of approximately 1.2 liters of yellow fluid was removed. Due to patient coughing/chest discomfort only the above amount of fluid was removed today. IMPRESSION: Successful ultrasound guided therapeutic left thoracentesis yielding 1.2 liters of pleural fluid. Read by: Rowe Robert, PA-C Electronically Signed   By: Markus Daft M.D.   On: 04/10/2019 17:18   US Thoracentesis Asp Pleural Space W/img  Guide  Result Date: 03/25/2019 INDICATION: Patient with history of metastatic breast cancer, dyspnea, bilateral pleural effusions. Status post right thoracentesis 03/24/2019; now request received for diagnostic and therapeutic left thoracentesis. EXAM: ULTRASOUND GUIDED DIAGNOSTIC AND THERAPEUTIC LEFT THORACENTESIS MEDICATIONS: None COMPLICATIONS: None immediate. PROCEDURE: An ultrasound guided thoracentesis was thoroughly discussed with the patient and questions answered. The benefits, risks, alternatives and complications were also discussed. The patient understands and wishes to proceed with the procedure. Written consent was obtained. Ultrasound was performed to localize and mark an adequate pocket of fluid in the left chest. The area was then prepped and draped in the normal sterile fashion. 1% Lidocaine was used for local anesthesia. Under ultrasound guidance a 6 Fr Safe-T-Centesis catheter was introduced. Thoracentesis was performed. The catheter was removed and a dressing applied. FINDINGS: A total of approximately 1.4 liters of yellow fluid was removed. Samples were sent to the laboratory as requested by the clinical team. IMPRESSION: Successful ultrasound guided diagnostic and therapeutic left thoracentesis yielding 1.4 liters of pleural fluid. Read by: Rowe Robert, PA-C Electronically Signed   By: Markus Daft M.D.   On: 03/25/2019 17:27   US Thoracentesis Asp Pleural Space W/img Guide  Result Date: 03/24/2019 INDICATION: Patient with history of metastatic breast cancer, dyspnea, and bilateral pleural effusions. Request is made for diagnostic and therapeutic right thoracentesis. EXAM: ULTRASOUND GUIDED DIAGNOSTIC AND THERAPEUTIC RIGHT THORACENTESIS MEDICATIONS: 10 mL 1% lidocaine COMPLICATIONS: None immediate. PROCEDURE: An ultrasound guided thoracentesis was thoroughly discussed with the patient and questions answered. The benefits, risks, alternatives and complications were also discussed. The  patient understands and wishes to proceed with the procedure. Written consent was obtained. Ultrasound was performed to localize and mark an adequate pocket of fluid in the right chest. The area was then prepped and draped in the normal sterile fashion. 1% Lidocaine was used for local anesthesia. Under ultrasound guidance a 6 Fr Safe-T-Centesis catheter was introduced. Thoracentesis was performed. The catheter was removed and a dressing applied. FINDINGS: A total of approximately 850 mL of hazy gold fluid was removed. Samples were sent to the laboratory as requested by the clinical team. IMPRESSION: Successful ultrasound guided right thoracentesis yielding 850 mL of pleural fluid. Read by: Earley Abide, PA-C No pneumothorax on follow-up radiograph. Electronically Signed   By: Lucrezia Europe M.D.   On: 03/24/2019 15:34    ASSESSMENT AND PLAN: 1.  Metastatic breast cancer 2.  Pancreatitis 3.  Recurrent pleural effusions 4.  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  Pulmonary embolism  -As a disease progression noted on her CT scan.  She is currently on anastrozole I recommend for her to continue this.  She has declined more aggressive treatment in the past.  She has been referred to palliative care as an outpatient and agree with having the palliative care team seeing her while she is here in the hospital. -Continue management of pancreatitis per hospitalist -Agree with Pleurx catheter placement.  This has previously been  recommended by Dr. Burr Medico.  She has had recurrent pleural effusions requiring frequent thoracenteses. -Calcium level trending downward with hydration.  She receives Zometa as an outpatient and is due for this next week.  We may consider going ahead and giving her a dose of Zometa while she is admitted. -Continue heparin while awaiting Pleurx catheter placement.  May switch back to Xarelto once all procedures are complete.   LOS: 1 day   Mikey Bussing, DNP, AGPCNP-BC,  AOCNP 04/21/19  Addendum  I have seen the patient, examined her. I agree with the assessment and and plan and have edited the notes. Pt was admitted for acute pancreatitis.  Her abdominal pain has improved, she was quite shortness of breath when she speaks. CT scan reviewed, I recommend pleurx during this hospitalization, and thoracentesis on the other side.  I called her daughter Marcie Bal, discussed her CT scan findings which showed cancer progression. I do not think anastrozole is adequate to control her rapid disease progression, and she is not a candidate for chemotherapy.  I recommend hospice, Marcie Bal is totally agreeable, she told me that her mother, brothers and aunt are still in denial, and are afraid to hear the word "hospice". I recommend palliative consult and have a family meeting with pt, Marcie Bal and other family members by palliative care MD, to discuss goal of care and hospice, hope they can understand the service more and accept it. Marcie Bal feels that's a good idea, she will speak with the rest of her family and let us know when they can be here.   I will follow up, and speak with palliative care team tomorrow.  Truitt Merle  04/21/2019

## 2019-04-21 NOTE — Consult Note (Signed)
Chief Complaint: Patient was seen in consultation today for recurrent malignant left pleural effusion/tunneled left pleurX catheter placement.  Referring Physician(s): Toy Baker  Supervising Physician: Aletta Edouard  Patient Status: [facility] - In-pt  History of Present Illness: Peggy Welch is a 78 y.o. 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 She is known to IR and has undergone multiple bilateral thoracentesis with [place] (most recent right side 04/14/2019 yielding 1.1 L and most recent left side 04/10/2019 yielding 1.2 L). She was unfortunately diagnosed with breast cancer in 02/2015 and diagnosed with metastatic disease 02/2019. Her cancer is managed by Dr. Burr Medico. She presented to [facility] ED 04/20/2019 with complaint of emesis and dyspnea. She was admitted for management of pancreatitis. Due to history, oncology was consulted who recommended IR placement of tunneled pleural pleurX catheter for symptom relief (of note, this placement has been previously recommended by Dr. Burr Medico).  IR requested by Dr. Roel Cluck for possible image-guided tunneled left pleural pleruX catheter placement. Patient awake and alert sitting in chair. She is very HOH making history difficulty to obtain. Does complain of dyspnea.  Last dose Xarelto 04/19/2019, on continuous IV Heparin.   Past Medical History:  Diagnosis Date   Arthritis    Asthma    Asthma exacerbation 10/14/2011   Blood transfusion without reported diagnosis    Hepatitis C    from blood transfusion   Hypertension    Pneumonia 4/02016   rt breast ca dx'd 2019    Past Surgical History:  Procedure Laterality Date   CHOLECYSTECTOMY  02/21/2002   JOINT REPLACEMENT     Bilateral total knee replacements   knee replacements Bilateral    Right Rotator Cuff  Repair  08/12/2006   TOTAL HIP ARTHROPLASTY Right 11/22/2014   dr Lorin Mercy   TOTAL HIP ARTHROPLASTY Right 11/22/2014   Procedure: TOTAL HIP ARTHROPLASTY ANTERIOR APPROACH;  Surgeon: Marybelle Killings, MD;  Location: [place];  Service: Orthopedics;  Laterality: Right;    Allergies: Shrimp [shellfish allergy]  Medications: Prior to Admission medications   Medication Sig Start Date End Date Taking? Authorizing Provider  acetaminophen (TYLENOL) 650 MG CR tablet Take 650 mg by mouth every 8 (eight) hours as needed for pain (pain).   Yes [provider]  acetaminophen-codeine (TYLENOL #3) 300-30 MG tablet Take 1 tablet by mouth every 6 (six) hours as needed for moderate pain. 04/07/19  Yes Truitt Merle, MD  albuterol (PROVENTIL) (2.5 MG/3ML) 0.083% nebulizer solution Take 3 mLs (2.5 mg total) by nebulization every 4 (four) hours as needed for wheezing or shortness of breath (wheezing). 03/26/19 04/25/19 Yes Elodia Florence., MD  anastrozole (ARIMIDEX) 1 MG tablet TAKE 1 TABLET(1 MG) BY MOUTH DAILY Patient taking differently: Take 1 mg by mouth daily.  03/28/19  Yes Truitt Merle, MD  B Complex-C (B-COMPLEX WITH VITAMIN C) tablet Take 1 tablet by mouth daily.   Yes [provider]  citalopram (CELEXA) 10 MG tablet Take 10 mg by mouth every morning. 03/10/19  Yes [provider]  mirtazapine (REMERON) 7.5 MG tablet Take 1 tablet (7.5 mg total) by mouth at bedtime. 04/19/19 05/19/19 Yes Truitt Merle, MD  Multiple Vitamin (MULTIVITAMIN WITH MINERALS) TABS tablet Take 1 tablet by mouth daily. 03/27/19 04/26/19 Yes Elodia Florence., MD  potassium chloride SA (K-DUR) 20 MEQ tablet Take 1 tablet (20 mEq total)  by mouth 2 (two) times daily. 04/19/19  Yes Truitt Merle, MD  rivaroxaban (XARELTO) 20 MG TABS tablet Take 20 mg by mouth daily with supper.   Yes [provider]     Family History  Problem Relation Age of Onset   Hypertension Sister     Social History   Socioeconomic History     Marital status: Legally Separated    Spouse name: Not on file   Number of children: Not on file   Years of education: Not on file   Highest education level: Not on file  Occupational History   Not on file  Social Needs   Financial resource strain: Not on file   Food insecurity    Worry: Not on file    Inability: Not on file   Transportation needs    Medical: Not on file    Non-medical: Not on file  Tobacco Use   Smoking status: Never Smoker   Smokeless tobacco: Never Used  Substance and Sexual Activity   Alcohol use: No   Drug use: No   Sexual activity: Never  Lifestyle   Physical activity    Days per week: Not on file    Minutes per session: Not on file   Stress: Not on file  Relationships   Social connections    Talks on phone: Not on file    Gets together: Not on file    Attends religious service: Not on file    Active member of club or organization: Not on file    Attends meetings of clubs or organizations: Not on file    Relationship status: Not on file  Other Topics Concern   Not on file  Social History Narrative   Not on file     Review of Systems: A 12 point ROS discussed and pertinent positives are indicated in the HPI above.  All other systems are negative.  Review of Systems  Unable to perform ROS: Other (Hard of hearing)  Respiratory: Positive for shortness of breath.     Vital Signs: BP 123/77 (BP Location: Right Arm)    Pulse 92    Temp 98.7 F (37.1 C) (Oral)    Resp 18    Ht 5\' 4"  (1.626 m)    Wt 165 lb (74.8 kg)    SpO2 94%    BMI 28.32 kg/m   Physical Exam Vitals signs and nursing note reviewed.  Constitutional:      General: She is not in acute distress.    Appearance: Normal appearance.  Cardiovascular:     Rate and Rhythm: Regular rhythm. Tachycardia present.     Heart sounds: Normal heart sounds. No murmur.  Pulmonary:     Effort: Pulmonary effort is normal. No respiratory distress.     Breath sounds: Normal  breath sounds. No wheezing.  Skin:    General: Skin is warm and dry.  Neurological:     Mental Status: She is alert and oriented to person, place, and time.  Psychiatric:        Mood and Affect: Mood normal.        Behavior: Behavior normal.        Thought Content: Thought content normal.        Judgment: Judgment normal.      MD Evaluation Airway: WNL Heart: WNL Abdomen: WNL Chest/ Lungs: WNL ASA  Classification: 3 Mallampati/Airway Score: Two   Imaging: Dg Chest 1 View  Result Date: 04/14/2019 CLINICAL DATA:  78 year old female  with a history of malignant effusion, status post thoracentesis EXAM: CHEST  1 VIEW COMPARISON:  04/10/2019 FINDINGS: Cardiomediastinal silhouette unchanged in size and contour, partially obscured by overlying lung and pleural disease. Improved aeration of the right lung status post thoracentesis with decreased pleural fluid. Partial obscuration the right hemidiaphragm. Persisting opacity at the left lung base with partial obscuration left hemidiaphragm and the left heart border. No pneumothorax. IMPRESSION: Decreased right-sided pleural fluid status post thoracentesis with no visualized pneumothorax. Small left pleural effusion with associated atelectasis/consolidation. Electronically Signed   By: Corrie Mckusick D.O.   On: 04/14/2019 10:59   Dg Chest 1 View  Result Date: 04/10/2019 CLINICAL DATA:  Status post left-sided thoracentesis. EXAM: CHEST  1 VIEW COMPARISON:  A 16 2020 FINDINGS: The patient has undergone left-sided thoracentesis. There is no evidence for left-sided pneumothorax. There is a small left-sided pleural effusion, substantially improved from prior study. There is a persistent moderate to large right-sided pleural effusion. The heart size is stable. There is presumed atelectasis at the lung bases. There are advanced degenerative changes of both glenohumeral joints. There is no right-sided pneumothorax. There is no definite acute osseous  abnormality. IMPRESSION: 1. No pneumothorax status post left-sided thoracentesis. 2. Small residual left-sided pleural effusion, significantly improved from prior study. 3. Persistent moderate to large right-sided pleural effusion. 4. Bibasilar airspace opacities, favored to represent atelectasis. Electronically Signed   By: Constance Holster M.D.   On: 04/10/2019 17:32   Dg Chest 1 View  Result Date: 03/25/2019 CLINICAL DATA:  78 year old female with a history of thoracentesis EXAM: CHEST  1 VIEW COMPARISON:  March 24, 2019, March 23, 2019 FINDINGS: Cardiomediastinal silhouette likely unchanged in size and contour. Decreased opacity at the left lung base with no visualized pneumothorax, and improved aeration. Blunting at the right costophrenic angle persists. Degenerative changes of the shoulders. Hazy opacity of the bilateral lower lungs. IMPRESSION: Decreased left-sided pleural fluid with no visualized pneumothorax. Persisting opacities at the bilateral lung base, including likely small trace right pleural effusion. Electronically Signed   By: Corrie Mckusick D.O.   On: 03/25/2019 15:48   Dg Chest 1 View  Result Date: 03/24/2019 CLINICAL DATA:  Status post right thoracentesis EXAM: CHEST  1 VIEW COMPARISON:  March 22, 2019 FINDINGS: There is interval resolution of right-sided pleural effusion. No pneumothorax. Streaky atelectasis seen at the right lung base. Again noted is a moderate left pleural effusion. The cardiomediastinal silhouette is unchanged with aortic knob calcifications. Degenerative changes seen at both shoulders. IMPRESSION: 1. No right-sided pneumothorax 2. Moderate left pleural effusion. Electronically Signed   By: Prudencio Pair M.D.   On: 03/24/2019 15:29   Dg Chest 2 View  Result Date: 04/10/2019 CLINICAL DATA:  Shortness of breath EXAM: CHEST - 2 VIEW COMPARISON:  03/25/2019 FINDINGS: Large bilateral pleural effusion. Most of the heart is obscured but likely enlarged. Cephalized blood  flow. No visible pneumothorax. Clip at the right hilum. IMPRESSION: Large pleural effusions and pulmonary vascular congestion. Electronically Signed   By: Monte Fantasia M.D.   On: 04/10/2019 12:45   Dg Chest 2 View  Result Date: 03/22/2019 CLINICAL DATA:  78 y.o female sent over from PCP for difficulty swallowing, productive coughing, and low 02. 95% RA in triage. Denies chest pain or shob. Hx. Asthma, HTN, PNA, and rt breast cancer EXAM: CHEST - 2 VIEW COMPARISON:  Chest CT, 12/14/2017. Chest radiographs, 11/30/2014 and earlier. FINDINGS: Moderate bilateral pleural effusions obscure the hemidiaphragms and most of the heart  borders. There is additional lung base opacity consistent with atelectasis. Remainder of the lungs is clear. No pneumothorax. Cardiac silhouette is top-normal in size. No mediastinal or hilar masses. Skeletal structures are intact. IMPRESSION: 1. Moderate bilateral pleural effusions with associated basilar atelectasis. Cannot exclude underlying pneumonia. No evidence of pulmonary edema. Electronically Signed   By: Lajean Manes M.D.   On: 03/22/2019 17:42   Ct Chest W Contrast  Result Date: 04/20/2019 CLINICAL DATA:  78 year old female with known right-sided breast cancer, right-sided abdominal pain and vomiting. EXAM: CT CHEST, ABDOMEN, AND PELVIS WITH CONTRAST TECHNIQUE: Multidetector CT imaging of the chest, abdomen and pelvis was performed following the standard protocol during bolus administration of intravenous contrast. CONTRAST:  152mL OMNIPAQUE IOHEXOL 300 MG/ML  SOLN COMPARISON:  Prior CT scan of the chest 03/22/2019; prior CT scan of the abdomen and pelvis 12/14/2017 FINDINGS: CT CHEST FINDINGS Cardiovascular: The heart is normal in size. No pericardial effusion. Calcifications visualized along the coronary arteries. No significant aortic aneurysm or evidence of dissection. The main pulmonary artery is normal in caliber and relatively well opacified. No central pulmonary  embolus. Mediastinum/Nodes: Dystrophic calcification in the left adrenal gland. No definite mediastinal or hilar lymph nodes. Lungs/Pleura: Large bilateral pleural effusions with associated significant lower lobe atelectasis bilaterally. The aerated portions of the lungs remain clear. No suspicious pulmonary mass or nodule visualized. Musculoskeletal: No acute fracture or aggressive appearing lytic or blastic osseous lesion. The right breast and chest wall are essentially completely replaced by extensive irregular soft tissue. There is marked skin thickening of the overlying breast. Soft tissue irregularity extends into the axilla and appears to encase the neurovascular bundle. Questionable involvement of the lateral aspect of the right first rib. CT ABDOMEN PELVIS FINDINGS Hepatobiliary: No focal liver abnormality is seen. Status post cholecystectomy. No biliary dilatation. Pancreas: The pancreatic head and uncinate process appear slightly indistinct, particularly compared to prior imaging. There is mild peripancreatic soft tissue stranding. Spleen: Normal in size without focal abnormality. Adrenals/Urinary Tract: The adrenal glands are unremarkable. No hydronephrosis or enhancing renal mass. No nephrolithiasis. Multiple circumscribed water attenuation simple cysts in the right kidney. Stomach/Bowel: No focal bowel wall thickening or evidence of obstruction. Vascular/Lymphatic: No aneurysm or dissection. Scattered atherosclerotic vascular calcifications. The abdominal aorta is tortuous. No suspicious lymphadenopathy. Reproductive: Dystrophic calcified uterine fibroids. No adnexal masses. Other: Trace fluid in the region of the hepato duodenal ligament. Small amount of fluid tracks along the anterior pararenal fascia on the right. Musculoskeletal: No acute fracture or aggressive appearing lytic or blastic osseous lesion. Multilevel degenerative disc disease. IMPRESSION: CT CHEST 1. Continued progression of primary  right breast cancer with extensive involvement of the right chest wall, axilla and right axillary neurovascular bundle. 2. Large bilateral pleural effusions with associated significant compressive atelectasis of both lower lobes. 3. Aortic and coronary artery calcifications. Aortic Atherosclerosis (ICD10-170.0). CT ABD/PELVIS 1. CT findings are suspicious for acute pancreatitis with indistinct and edematous pancreatic head and uncinate process and adjacent peripancreatic inflammatory stranding and trace fluid. 2. Additional ancillary findings as above. Electronically Signed   By: Jacqulynn Cadet M.D.   On: 04/20/2019 19:51   Ct Angio Chest Pe W And/or Wo Contrast  Result Date: 03/22/2019 CLINICAL DATA:  Difficulty swallowing, productive cough, hypoxia. History of breast cancer. EXAM: CT ANGIOGRAPHY CHEST WITH CONTRAST TECHNIQUE: Multidetector CT imaging of the chest was performed using the standard protocol during bolus administration of intravenous contrast. Multiplanar CT image reconstructions and MIPs were obtained to evaluate the  vascular anatomy. CONTRAST:  127mL OMNIPAQUE IOHEXOL 350 MG/ML SOLN COMPARISON:  Chest radiographs dated 03/22/2019. CT chest dated 12/14/2017. FINDINGS: Cardiovascular: Satisfactory opacification of the bilateral pulmonary arteries to the lobar level. Lobar/segmental pulmonary embolism within a branch of the right lower lobe pulmonary artery (series 8/image 117). Overall clot burden is small. No evidence of right heart strain. No evidence of thoracic aortic aneurysm or dissection. Mild atherosclerotic calcifications of the aortic arch. The heart is normal in size.  Trace pericardial fluid. Mediastinum/Nodes: No suspicious mediastinal lymphadenopathy. Visualized thyroid is unremarkable. Lungs/Pleura: Evaluation of the lung parenchyma is constrained by respiratory motion. Within that constraint, there are no suspicious pulmonary nodules. Suspected mild perihilar edema in the  bilateral upper lobes. Moderate to large bilateral pleural effusions. Associated areas of scattered atelectasis in the bilateral upper lobes with dependent atelectasis in the bilateral lower lobes. No pneumothorax. Upper Abdomen: Visualized upper abdomen is notable for prior cholecystectomy. Musculoskeletal: Abnormal soft tissue throughout the right breast and extending to the right axilla with involvement of the right pectoralis musculature (series 7/image 27), measuring approximately 4.5 x 14.5 cm in axial dimension. While some of this appearance may reflect radiation change, the overall appearance is considered concerning for infiltrating tumor which has progressed from the prior. Overlying skin thickening. Degenerative changes of the visualized thoracolumbar spine. Review of the MIP images confirms the above findings. IMPRESSION: Lobar/segmental pulmonary embolism within a branch of the right lower lobe pulmonary artery. Overall clot burden is small. No evidence of right heart strain. Mild interstitial edema. Moderate to large bilateral pleural effusions. Dependent atelectasis in the bilateral lower lobes. Abnormal soft tissue throughout the right breast, extending to the right axilla, and involving the right pectoralis musculature. This appearance is concerning for infiltrating tumor which has progressed from the prior. Overlying skin thickening. These results were called by telephone at the time of interpretation on 03/22/2019 at 8:48 pm to Dr. Gareth Morgan, who verbally acknowledged these results. Aortic Atherosclerosis (ICD10-I70.0). Electronically Signed   By: Julian Hy M.D.   On: 03/22/2019 20:49   Ct Abdomen Pelvis W Contrast  Result Date: 04/20/2019 CLINICAL DATA:  78 year old female with known right-sided breast cancer, right-sided abdominal pain and vomiting. EXAM: CT CHEST, ABDOMEN, AND PELVIS WITH CONTRAST TECHNIQUE: Multidetector CT imaging of the chest, abdomen and pelvis was  performed following the standard protocol during bolus administration of intravenous contrast. CONTRAST:  117mL OMNIPAQUE IOHEXOL 300 MG/ML  SOLN COMPARISON:  Prior CT scan of the chest 03/22/2019; prior CT scan of the abdomen and pelvis 12/14/2017 FINDINGS: CT CHEST FINDINGS Cardiovascular: The heart is normal in size. No pericardial effusion. Calcifications visualized along the coronary arteries. No significant aortic aneurysm or evidence of dissection. The main pulmonary artery is normal in caliber and relatively well opacified. No central pulmonary embolus. Mediastinum/Nodes: Dystrophic calcification in the left adrenal gland. No definite mediastinal or hilar lymph nodes. Lungs/Pleura: Large bilateral pleural effusions with associated significant lower lobe atelectasis bilaterally. The aerated portions of the lungs remain clear. No suspicious pulmonary mass or nodule visualized. Musculoskeletal: No acute fracture or aggressive appearing lytic or blastic osseous lesion. The right breast and chest wall are essentially completely replaced by extensive irregular soft tissue. There is marked skin thickening of the overlying breast. Soft tissue irregularity extends into the axilla and appears to encase the neurovascular bundle. Questionable involvement of the lateral aspect of the right first rib. CT ABDOMEN PELVIS FINDINGS Hepatobiliary: No focal liver abnormality is seen. Status post cholecystectomy. No biliary  dilatation. Pancreas: The pancreatic head and uncinate process appear slightly indistinct, particularly compared to prior imaging. There is mild peripancreatic soft tissue stranding. Spleen: Normal in size without focal abnormality. Adrenals/Urinary Tract: The adrenal glands are unremarkable. No hydronephrosis or enhancing renal mass. No nephrolithiasis. Multiple circumscribed water attenuation simple cysts in the right kidney. Stomach/Bowel: No focal bowel wall thickening or evidence of obstruction.  Vascular/Lymphatic: No aneurysm or dissection. Scattered atherosclerotic vascular calcifications. The abdominal aorta is tortuous. No suspicious lymphadenopathy. Reproductive: Dystrophic calcified uterine fibroids. No adnexal masses. Other: Trace fluid in the region of the hepato duodenal ligament. Small amount of fluid tracks along the anterior pararenal fascia on the right. Musculoskeletal: No acute fracture or aggressive appearing lytic or blastic osseous lesion. Multilevel degenerative disc disease. IMPRESSION: CT CHEST 1. Continued progression of primary right breast cancer with extensive involvement of the right chest wall, axilla and right axillary neurovascular bundle. 2. Large bilateral pleural effusions with associated significant compressive atelectasis of both lower lobes. 3. Aortic and coronary artery calcifications. Aortic Atherosclerosis (ICD10-170.0). CT ABD/PELVIS 1. CT findings are suspicious for acute pancreatitis with indistinct and edematous pancreatic head and uncinate process and adjacent peripancreatic inflammatory stranding and trace fluid. 2. Additional ancillary findings as above. Electronically Signed   By: Jacqulynn Cadet M.D.   On: 04/20/2019 19:51   Dg Chest Port 1 View  Result Date: 04/20/2019 CLINICAL DATA:  77 year old female with shortness of breath EXAM: PORTABLE CHEST 1 VIEW COMPARISON:  Prior chest x-ray 04/14/2019 FINDINGS: Small to moderate bilateral layering pleural effusions larger on the left than the right. The cardiac contours are largely obscured. Suspect underlying cardiomegaly. Atherosclerotic calcifications present in the transverse aorta. Pulmonary vascular congestion is present. No evidence of pneumothorax. No acute osseous abnormality. Degenerative changes at the left glenohumeral joint. Soft tissue anchors in the right humeral head from prior rotator cuff repair. IMPRESSION: 1. Small to moderate bilateral layering pleural effusions, larger on the left than  the right. 2. Pulmonary vascular congestion with mild pulmonary edema. 3.  Aortic Atherosclerosis (ICD10-170.0) Electronically Signed   By: Jacqulynn Cadet M.D.   On: 04/20/2019 18:49   Dg Swallowing Func-speech Pathology  Result Date: 03/23/2019 Objective Swallowing Evaluation: Type of Study: MBS-Modified Barium Swallow Study  Patient Details Name: Peggy Welch MRN: [id number] Date of Birth: 1941-02-27 Today's Date: 03/23/2019 Time: SLP Start Time (ACUTE ONLY): 0810 -SLP Stop Time (ACUTE ONLY): 0840 SLP Time Calculation (min) (ACUTE ONLY): 30 min Past Medical History: Past Medical History: Diagnosis Date  Arthritis   Asthma   Asthma exacerbation 10/14/2011  Blood transfusion without reported diagnosis   Hepatitis C   from blood transfusion  Hypertension   Pneumonia 4/02016  rt breast ca dx'd 2019 Past Surgical History: Past Surgical History: Procedure Laterality Date  CHOLECYSTECTOMY  02/21/2002  JOINT REPLACEMENT    Bilateral total knee replacements  knee replacements Bilateral   Right Rotator Cuff Repair  08/12/2006  TOTAL HIP ARTHROPLASTY Right 11/22/2014  dr Lorin Mercy  TOTAL HIP ARTHROPLASTY Right 11/22/2014  Procedure: TOTAL HIP ARTHROPLASTY ANTERIOR APPROACH;  Surgeon: Marybelle Killings, MD;  Location: [place];  Service: Orthopedics;  Laterality: Right; HPI: pt is a 78 yo female with h/o breast cancer admitted to [facility] with dysphagia.  Pt found to have hypoxia also.  MBS ordered.  Pt reports dysphagia clinically with symptoms including coughing on water immediately after swallowing.  Pt CT chest showed "Abnormal soft tissue throughout the right breast and extending to the right axilla with involvement  of the right pectoralis musculature (series 7/image 27), measuring approximately 4.5 x 14.5 cm in axial dimension. While some of this appearance may reflect radiation change, the overall appearance is considered concerning for infiltrating tumor which has progressed from prior".  Pt is also positive for  pulmonary embolism.  She also has bilateral pleural effusions.  Per MD notes, pt has not followed oncology for one year and does not want further treatment.  Subjective: pt awake in chair, very HOH Assessment / Plan / Recommendation CHL IP CLINICAL IMPRESSIONS 03/23/2019 Clinical Impression Patient presents with mild pharyngeal dysphagia with motor impairments but no aspiration.  Pt's dysphagia marked by decreased timing of laryngeal closure which allows consistent laryngeal penetration of thin liquids and pharyngeal retention clearance.  She is however protective of her airway and independently conducts dry swallows with breath hold to clear larynx/pharynx of liquid residual.  Indequate epiglottic deflection negatively impacts solids/puree pharyngeal clearance although pt is sensate.  Reflexive dry swallow decreased solid/puree residuals and liquid "wash" helpful to clear pharynx.  Postures attempted (head turn right and chin tuck) although neck ROM very limited thus not adequately performed nor were they helpful to prevent residuals or decrease amount of laryngeal penetration.  Pt with increased aspiration risk with liquid using chin tuck as it contributes to spillage of residuals from pyriform sinus into larynx.  Pt is compensating well for her dysphagia without airway infiltration.  Recommend pt maximize her liquid nutritional intake as liquids clear pharynx easier than solids.  Pt reports she has been drinking liquid supplements at home.  Recommend dys3/thin diet with strict aspiration precautions.  Given pt reports xerostomia, recommend start all po with liquids.  Question if pt's tumor progression may be impacting her pharyngeal and esophageal swallowing ability.  Will follow up for pt education to compensation strategies.  Note per review of chart, she did not want further treatment of her breast cancer thus compensation for dysphagia is likely optimal route for her goals.Of note, pt did cough x1 during MBS  but there was not barium visualized in her trachea. SLP Visit Diagnosis Dysphagia, pharyngoesophageal phase (R13.14) Attention and concentration deficit following -- Frontal lobe and executive function deficit following -- Impact on safety and function Mild aspiration risk   [facility] IP TREATMENT RECOMMENDATION 12/04/2014 Treatment Recommendations No treatment recommended at this time   No flowsheet data found. [facility] IP DIET RECOMMENDATION 03/23/2019 SLP Diet Recommendations Dysphagia 3 (Mech soft) solids;Thin liquid Liquid Administration via Cup;Straw Medication Administration Crushed with puree Compensations Slow rate;Small sips/bites;Multiple dry swallows after each bite/sip;Follow solids with liquid Postural Changes Remain semi-upright after after feeds/meals (Comment);Seated upright at 90 degrees   [facility] IP OTHER RECOMMENDATIONS 03/23/2019 Recommended Consults -- Oral Care Recommendations Oral care BID Other Recommendations --   [facility] IP FOLLOW UP RECOMMENDATIONS 12/04/2014 Follow up Recommendations None   [facility] IP FREQUENCY AND DURATION 03/23/2019 Speech Therapy Frequency (ACUTE ONLY) min 1 x/week Treatment Duration 1 week      [facility] IP ORAL PHASE 03/23/2019 Oral Phase Impaired Oral - Pudding Teaspoon -- Oral - Pudding Cup -- Oral - Honey Teaspoon -- Oral - Honey Cup -- Oral - Nectar Teaspoon -- Oral - Nectar Cup WFL;Piecemeal swallowing Oral - Nectar Straw -- Oral - Thin Teaspoon WFL;Piecemeal swallowing Oral - Thin Cup [facility];Piecemeal swallowing Oral - Thin Straw Piecemeal swallowing;WFL Oral - Puree Lingual pumping Oral - Mech Soft WFL Oral - Regular -- Oral - Multi-Consistency -- Oral - Pill -- Oral Phase - Comment --  [facility]  IP PHARYNGEAL PHASE 03/23/2019 Pharyngeal Phase -- Pharyngeal- Pudding Teaspoon -- Pharyngeal -- Pharyngeal- Pudding Cup -- Pharyngeal -- Pharyngeal- Honey Teaspoon -- Pharyngeal -- Pharyngeal- Honey Cup -- Pharyngeal -- Pharyngeal- Nectar Teaspoon -- Pharyngeal -- Pharyngeal- Nectar Cup -- Pharyngeal --  Pharyngeal- Nectar Straw -- Pharyngeal -- Pharyngeal- Thin Teaspoon WFL Pharyngeal Material does not enter airway Pharyngeal- Thin Cup Reduced airway/laryngeal closure;Penetration/Aspiration during swallow;Penetration/Aspiration before swallow;Pharyngeal residue - pyriform;Pharyngeal residue - valleculae Pharyngeal Material enters airway, remains ABOVE vocal cords then ejected out Pharyngeal- Thin Straw Reduced airway/laryngeal closure;Pharyngeal residue - valleculae;Pharyngeal residue - pyriform Pharyngeal Material enters airway, remains ABOVE vocal cords then ejected out Pharyngeal- Puree Reduced epiglottic inversion;Reduced tongue base retraction;Pharyngeal residue - valleculae;Pharyngeal residue - pyriform Pharyngeal Material does not enter airway Pharyngeal- Mechanical Soft Reduced tongue base retraction;Reduced epiglottic inversion;Pharyngeal residue - valleculae;Pharyngeal residue - pyriform Pharyngeal Material does not enter airway Pharyngeal- Regular -- Pharyngeal -- Pharyngeal- Multi-consistency -- Pharyngeal -- Pharyngeal- Pill -- Pharyngeal -- Pharyngeal Comment pt with minimal neck ROM and thus chin tuck nor head turn right were nof performed adequately and they were not helpful in preventing laryngeal penetration or decreasing pharyngeal stasis; reflexive dry swallows with breath hold with liquids protective of her airway and liquid swallow post-swallow of food decreased pharyngeal residuals  [facility] IP CERVICAL ESOPHAGEAL PHASE 03/23/2019 Cervical Esophageal Phase Impaired Pudding Teaspoon -- Pudding Cup -- Honey Teaspoon -- Honey Cup -- Nectar Teaspoon -- Nectar Cup -- Nectar Straw -- Thin Teaspoon -- Thin Cup -- Thin Straw -- Puree -- Mechanical Soft -- Regular -- Multi-consistency -- Pill -- Cervical Esophageal Comment With puree, pt appeared with halting below CP but this did clear with liquids.  Upon esophageal sweep, pt appeared with minimal esophagus - suspect WFL.  Radiologist not present to  confirm findings. Luanna Salk, MS [facility] SLP Acute Rehab Services Pager [phone] Office [phone] Macario Golds 03/23/2019, 11:08 AM              Vas [place] Lower Extremity Venous (dvt)  Result Date: 03/25/2019  Lower Venous Study Indications: Pulmonary embolism.  Comparison Study: no prior Performing Technologist: Abram Sander RVS  Examination Guidelines: A complete evaluation includes B-mode imaging, spectral Doppler, color Doppler, and power Doppler as needed of all accessible portions of each vessel. Bilateral testing is considered an integral part of a complete examination. Limited examinations for reoccurring indications may be performed as noted.  +---------+---------------+---------+-----------+----------+-------+  RIGHT     Compressibility Phasicity Spontaneity Properties Summary  +---------+---------------+---------+-----------+----------+-------+  CFV       Full            Yes       Yes                             +---------+---------------+---------+-----------+----------+-------+  SFJ       Full                                                      +---------+---------------+---------+-----------+----------+-------+  FV Prox   Full                                                      +---------+---------------+---------+-----------+----------+-------+  FV Mid    Full                                                      +---------+---------------+---------+-----------+----------+-------+  FV Distal Full                                                      +---------+---------------+---------+-----------+----------+-------+  PFV       Full                                                      +---------+---------------+---------+-----------+----------+-------+  POP       Full            Yes       Yes                             +---------+---------------+---------+-----------+----------+-------+  PTV       Full                                                       +---------+---------------+---------+-----------+----------+-------+  PERO      Full                                                      +---------+---------------+---------+-----------+----------+-------+   +---------+---------------+---------+-----------+----------+--------------+  LEFT      Compressibility Phasicity Spontaneity Properties Summary         +---------+---------------+---------+-----------+----------+--------------+  CFV       Full            Yes       Yes                                    +---------+---------------+---------+-----------+----------+--------------+  SFJ       Full                                                             +---------+---------------+---------+-----------+----------+--------------+  FV Prox   Full                                                             +---------+---------------+---------+-----------+----------+--------------+  FV Mid    Full                                                             +---------+---------------+---------+-----------+----------+--------------+  FV Distal Full                                                             +---------+---------------+---------+-----------+----------+--------------+  PFV       Full                                                             +---------+---------------+---------+-----------+----------+--------------+  POP       Full            Yes       Yes                                    +---------+---------------+---------+-----------+----------+--------------+  PTV       Full                                                             +---------+---------------+---------+-----------+----------+--------------+  PERO                                                       Not visualized  +---------+---------------+---------+-----------+----------+--------------+     Summary: Right: There is no evidence of deep vein thrombosis in the lower extremity. Left: No evidence of deep vein thrombosis in the lower  extremity.  *See table(s) above for measurements and observations. Electronically signed by Curt Jews MD on 03/25/2019 at 1:51:23 PM.    Final    US Thoracentesis Asp Pleural Space W/img Guide  Result Date: 04/14/2019 INDICATION: Patient with history of metastatic breast cancer, dyspnea, and recurrent bilateral pleural effusions. Request is made for diagnostic and therapeutic right thoracentesis. EXAM: ULTRASOUND GUIDED DIAGNOSTIC AND THERAPEUTIC RIGHT THORACENTESIS MEDICATIONS: 10 mL 1% lidocaine COMPLICATIONS: None immediate. PROCEDURE: An ultrasound guided thoracentesis was thoroughly discussed with the patient and questions answered. The benefits, risks, alternatives and complications were also discussed. The patient understands and wishes to proceed with the procedure. Written consent was obtained. Ultrasound was performed to localize and mark an adequate pocket of fluid in the right chest. The area was then prepped and draped in the normal sterile fashion. 1% Lidocaine was used for local anesthesia. Under ultrasound guidance a 6 Fr Safe-T-Centesis catheter was introduced. Thoracentesis was performed. The catheter was removed and a dressing applied. FINDINGS: A total of approximately 1.1 L of clear gold fluid was removed. Procedure was stopped after 1.1 L due to patient coughing. IMPRESSION: Successful ultrasound guided right thoracentesis yielding 1.1 L of pleural fluid. Read by: Earley Abide, PA-C Electronically Signed   By: Corrie Mckusick D.O.   On: 04/14/2019 10:58   US Thoracentesis Asp Pleural Space W/img Guide  Result Date: 04/10/2019 INDICATION: Patient with history of metastatic  breast cancer, dyspnea, bilateral pleural effusions. Request made for therapeutic left thoracentesis. EXAM: ULTRASOUND GUIDED THERAPEUTIC LEFT THORACENTESIS MEDICATIONS: None COMPLICATIONS: None immediate. PROCEDURE: An ultrasound guided thoracentesis was thoroughly discussed with the patient and questions answered.  The benefits, risks, alternatives and complications were also discussed. The patient understands and wishes to proceed with the procedure. Written consent was obtained. Ultrasound was performed to localize and mark an adequate pocket of fluid in the left chest. The area was then prepped and draped in the normal sterile fashion. 1% Lidocaine was used for local anesthesia. Under ultrasound guidance a 6 Fr Safe-T-Centesis catheter was introduced. Thoracentesis was performed. The catheter was removed and a dressing applied. FINDINGS: A total of approximately 1.2 liters of yellow fluid was removed. Due to patient coughing/chest discomfort only the above amount of fluid was removed today. IMPRESSION: Successful ultrasound guided therapeutic left thoracentesis yielding 1.2 liters of pleural fluid. Read by: Rowe Robert, PA-C Electronically Signed   By: Markus Daft M.D.   On: 04/10/2019 17:18   US Thoracentesis Asp Pleural Space W/img Guide  Result Date: 03/25/2019 INDICATION: Patient with history of metastatic breast cancer, dyspnea, bilateral pleural effusions. Status post right thoracentesis 03/24/2019; now request received for diagnostic and therapeutic left thoracentesis. EXAM: ULTRASOUND GUIDED DIAGNOSTIC AND THERAPEUTIC LEFT THORACENTESIS MEDICATIONS: None COMPLICATIONS: None immediate. PROCEDURE: An ultrasound guided thoracentesis was thoroughly discussed with the patient and questions answered. The benefits, risks, alternatives and complications were also discussed. The patient understands and wishes to proceed with the procedure. Written consent was obtained. Ultrasound was performed to localize and mark an adequate pocket of fluid in the left chest. The area was then prepped and draped in the normal sterile fashion. 1% Lidocaine was used for local anesthesia. Under ultrasound guidance a 6 Fr Safe-T-Centesis catheter was introduced. Thoracentesis was performed. The catheter was removed and a dressing applied.  FINDINGS: A total of approximately 1.4 liters of yellow fluid was removed. Samples were sent to the laboratory as requested by the clinical team. IMPRESSION: Successful ultrasound guided diagnostic and therapeutic left thoracentesis yielding 1.4 liters of pleural fluid. Read by: Rowe Robert, PA-C Electronically Signed   By: Markus Daft M.D.   On: 03/25/2019 17:27   US Thoracentesis Asp Pleural Space W/img Guide  Result Date: 03/24/2019 INDICATION: Patient with history of metastatic breast cancer, dyspnea, and bilateral pleural effusions. Request is made for diagnostic and therapeutic right thoracentesis. EXAM: ULTRASOUND GUIDED DIAGNOSTIC AND THERAPEUTIC RIGHT THORACENTESIS MEDICATIONS: 10 mL 1% lidocaine COMPLICATIONS: None immediate. PROCEDURE: An ultrasound guided thoracentesis was thoroughly discussed with the patient and questions answered. The benefits, risks, alternatives and complications were also discussed. The patient understands and wishes to proceed with the procedure. Written consent was obtained. Ultrasound was performed to localize and mark an adequate pocket of fluid in the right chest. The area was then prepped and draped in the normal sterile fashion. 1% Lidocaine was used for local anesthesia. Under ultrasound guidance a 6 Fr Safe-T-Centesis catheter was introduced. Thoracentesis was performed. The catheter was removed and a dressing applied. FINDINGS: A total of approximately 850 mL of hazy gold fluid was removed. Samples were sent to the laboratory as requested by the clinical team. IMPRESSION: Successful ultrasound guided right thoracentesis yielding 850 mL of pleural fluid. Read by: Earley Abide, PA-C No pneumothorax on follow-up radiograph. Electronically Signed   By: Lucrezia Europe M.D.   On: 03/24/2019 15:34    Labs:  CBC: Recent Labs    04/07/19 1058 04/10/19 1202 04/20/19  1749 04/21/19 0748  WBC 5.5 6.7 8.1 10.5  HGB 12.3 12.2 12.6 11.7*  HCT 40.0 40.2 41.0 37.9  PLT  447* 505* 345 316    COAGS: Recent Labs    03/22/19 2114 04/10/19 1202 04/21/19 0748  INR 1.1 1.4*  --   APTT 29  --  75*    BMP: Recent Labs    04/07/19 1058 04/10/19 1202 04/20/19 1749 04/21/19 0748  NA 139 139 139 138  K 4.3 3.9 4.6 3.9  CL 107 105 103 108  CO2 26 29 29 24   GLUCOSE 100* 105* 138* 98  BUN 8 8 13 12   CALCIUM 11.3* 11.7* 12.5* 10.5*  CREATININE 0.74 0.63 0.62 0.59  GFRNONAA >60 >60 >60 >60  GFRAA >60 >60 >60 >60    LIVER FUNCTION TESTS: Recent Labs    03/26/19 0528 04/07/19 1058 04/20/19 1749 04/21/19 0748  BILITOT 1.3* 0.5 0.8 0.9  AST 26 30 59* 40  ALT 14 11 25 18   ALKPHOS 37* 58 112 85  PROT 5.2* 6.7 7.1 5.9*  ALBUMIN 2.5* 3.1* 3.5 2.8*     Assessment and Plan:  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 Plan for image-guided tunneled left pleural pleurX catheter placement tentatively for tomorrow 04/22/2019 in IR with Dr. Laurence Ferrari. Patient will be NPO at midnight. Afebrile and WBCs WNL. Last dose Xarelto 04/19/2019, will hold continuous IV Heparin 4 hours prior to procedure per Dr. Kathlene Cote. INR pending for 0500 tomorrow. COVID negative 04/20/2019.  Risks and benefits discussed with the patient including bleeding, infection, damage to adjacent structures, malfunction of the catheter with need for additional procedures. All of the patient's questions were answered, patient is agreeable to proceed. Consent signed and in chart.   Thank you for this interesting consult.  I greatly enjoyed meeting JULIE RANFT and look forward to participating in their care.  A copy of this report was sent to the requesting provider on this date.  Electronically Signed: Earley Abide, PA-C 04/21/2019, 1:26 PM   I spent a total of 40 Minutes in face to face in clinical consultation, greater than 50% of which was counseling/coordinating care for recurrent malignant left pleural effusion/tunneled left pleural pleurX  catheter placement.

## 2019-04-21 NOTE — Progress Notes (Signed)
Pt daughter called back and given current account of her mother's health status. She does state that she does not have any abdominal discomfort this evening. She is not currently having any nausea or vomiting. She has no other complaints this evening. Remains afebrile and other vital signs are stable this evening.

## 2019-04-22 ENCOUNTER — Encounter (HOSPITAL_COMMUNITY): Payer: Self-pay | Admitting: Interventional Radiology

## 2019-04-22 ENCOUNTER — Inpatient Hospital Stay (HOSPITAL_COMMUNITY): Payer: Medicare Other

## 2019-04-22 HISTORY — PX: IR PERC PLEURAL DRAIN W/INDWELL CATH W/IMG GUIDE: IMG5383

## 2019-04-22 LAB — APTT: aPTT: 98 seconds — ABNORMAL HIGH (ref 24–36)

## 2019-04-22 LAB — PROTIME-INR
INR: 1.2 (ref 0.8–1.2)
Prothrombin Time: 14.9 seconds (ref 11.4–15.2)

## 2019-04-22 LAB — CBC
HCT: 38.5 % (ref 36.0–46.0)
Hemoglobin: 11.6 g/dL — ABNORMAL LOW (ref 12.0–15.0)
MCH: 30.7 pg (ref 26.0–34.0)
MCHC: 30.1 g/dL (ref 30.0–36.0)
MCV: 101.9 fL — ABNORMAL HIGH (ref 80.0–100.0)
Platelets: 331 10*3/uL (ref 150–400)
RBC: 3.78 MIL/uL — ABNORMAL LOW (ref 3.87–5.11)
RDW: 14.3 % (ref 11.5–15.5)
WBC: 14.7 10*3/uL — ABNORMAL HIGH (ref 4.0–10.5)
nRBC: 0 % (ref 0.0–0.2)

## 2019-04-22 LAB — HEPARIN LEVEL (UNFRACTIONATED): Heparin Unfractionated: 0.59 IU/mL (ref 0.30–0.70)

## 2019-04-22 MED ORDER — METHYLPREDNISOLONE SODIUM SUCC 40 MG IJ SOLR
40.0000 mg | Freq: Every day | INTRAMUSCULAR | Status: DC
  Administered 2019-04-22 – 2019-04-24 (×3): 40 mg via INTRAVENOUS
  Filled 2019-04-22 (×3): qty 1

## 2019-04-22 MED ORDER — CEFAZOLIN SODIUM-DEXTROSE 2-4 GM/100ML-% IV SOLN
INTRAVENOUS | Status: AC
  Administered 2019-04-22: 16:00:00
  Filled 2019-04-22: qty 100

## 2019-04-22 MED ORDER — LIDOCAINE-EPINEPHRINE 1 %-1:100000 IJ SOLN
INTRAMUSCULAR | Status: AC
  Filled 2019-04-22: qty 1

## 2019-04-22 MED ORDER — LIDOCAINE HCL (PF) 1 % IJ SOLN
INTRAMUSCULAR | Status: AC | PRN
  Administered 2019-04-22: 10 mL

## 2019-04-22 MED ORDER — LIDOCAINE HCL 1 % IJ SOLN
INTRAMUSCULAR | Status: AC
  Filled 2019-04-22: qty 20

## 2019-04-22 MED ORDER — IPRATROPIUM-ALBUTEROL 0.5-2.5 (3) MG/3ML IN SOLN: 3.0000 mL | RESPIRATORY_TRACT | Status: DC | PRN

## 2019-04-22 MED ORDER — MIDAZOLAM HCL 2 MG/2ML IJ SOLN
INTRAMUSCULAR | Status: AC | PRN
  Administered 2019-04-22: 1 mg via INTRAVENOUS

## 2019-04-22 MED ORDER — IPRATROPIUM-ALBUTEROL 0.5-2.5 (3) MG/3ML IN SOLN
3.0000 mL | Freq: Three times a day (TID) | RESPIRATORY_TRACT | Status: DC
  Administered 2019-04-23 – 2019-04-24 (×4): 3 mL via RESPIRATORY_TRACT
  Filled 2019-04-22 (×4): qty 3

## 2019-04-22 MED ORDER — IPRATROPIUM-ALBUTEROL 0.5-2.5 (3) MG/3ML IN SOLN
3.0000 mL | Freq: Four times a day (QID) | RESPIRATORY_TRACT | Status: DC
  Administered 2019-04-22 (×2): 3 mL via RESPIRATORY_TRACT
  Filled 2019-04-22 (×2): qty 3

## 2019-04-22 MED ORDER — MIDAZOLAM HCL 2 MG/2ML IJ SOLN
INTRAMUSCULAR | Status: AC
  Filled 2019-04-22: qty 2

## 2019-04-22 MED ORDER — HEPARIN (PORCINE) 25000 UT/250ML-% IV SOLN
1100.0000 [IU]/h | INTRAVENOUS | Status: AC
  Administered 2019-04-22: 1000 [IU]/h via INTRAVENOUS
  Administered 2019-04-23: 1100 [IU]/h via INTRAVENOUS
  Filled 2019-04-22: qty 250

## 2019-04-22 MED ORDER — FENTANYL CITRATE (PF) 100 MCG/2ML IJ SOLN
INTRAMUSCULAR | Status: AC
  Filled 2019-04-22: qty 2

## 2019-04-22 NOTE — Procedures (Signed)
Interventional Radiology Procedure Note  Procedure: Placement of a LEFT tunneled pleural drainage catheter.   Complications: None  Estimated Blood Loss: None  Recommendations: - Daily drainage orders placed.  Signed,  Criselda Peaches, MD

## 2019-04-22 NOTE — TOC Progression Note (Signed)
Transition of Care [facility]) - Progression Note    Patient Details  Name: Peggy Welch MRN: [id number] Date of Birth: 1940/10/05  [facility] CM/SW Contact  Purcell Mouton, RN Phone Number: 04/22/2019, 4:30 PM  Clinical Narrative:    Pt discharging Home with daughter. Spoke with daughter Peggy Welch via phone concerning [place]. [facility] will service. Pt's daughter is aware and she asked for HHRN/NA/PT.        Expected Discharge Plan and Services                                                 Social Determinants of Health (SDOH) Interventions    Readmission Risk Interventions No flowsheet data found.

## 2019-04-22 NOTE — Progress Notes (Signed)
PROGRESS NOTE    Peggy Welch  [id number] DOB: [birth year] DOA: 04/20/2019 PCP: Lucianne Lei, MD    Brief Narrative:  78 y.o. female with medical history significant of breast cancer metastases, pleural effusion metastatic, PE on Xarelto, hard of hearing, chronic hypoxia on oxygen, asthma, hepatitis C from blood transfusion, HTN Dysphasia on dysphagia 2 diet, hypercalcemia  Admitted for pancreatitis  Assessment & Plan:   Active Problems:   HTN (hypertension)   Breast cancer of lower-inner quadrant of right female breast ([place])   Hypercalcemia of malignancy   Pulmonary embolism (HCC)   Dysphagia   Personal history of venous thrombosis and embolism   Pancreatitis   Pressure injury of skin  Present on Admission:  Pancreatitis -unclear etiology  -CT of abdomen with findings suggesting possible acute pancreatitis  no evidence of pseudocyst -per my own read of CT, stool noted along ascending colon, corresponding to location of patient's abd pain -Pt reports recent BM with much improvement in abd discomfort -Tolerated advancing diet, currently NPO for Pleurx placement     HTN (hypertension)  -BP presently stable at this time   Breast cancer of lower-inner quadrant of right female breast [facility])  -appreciate input by Oncology. Recommendation for Pleur-x, consideration for zometa. Pt had been referred to Palliative Care. Dr. Hilma Favors following   Hypercalcemia of malignancy  -Ca improved from 12.5 to 10.5 with hydration   Pulmonary embolism ([place])  -bridged with heparin for now, while awaiting Pleurx -Per Oncology, plan to resume xarelto following Pleurx placement  Pleural effusion  -patient is interested in Pleurx catheter placement, was recommended by Oncology -IR consulted, plan for Pleurx placement today -This AM, pt noted to have acutely worsened sob, prompting rapid response -O2 requirements remained stable. Pt reportedly improved with duoneb -STAT CXR ordered  and personally reviewed. Findings of moderate B effusions, L>R -Cont duonebs for now, pending Pleurx placement. Updated Oncology  DVT prophylaxis: heparin gtt Code Status: DNR Family Communication: Pt in room, family not at bedside Disposition Plan: Uncertain at this time  Consultants:   Oncology  IR  Palliative Care  Procedures:     Antimicrobials: Anti-infectives (From admission, onward)   None      Subjective: Sob this AM, improved with duoneb  Objective: Vitals:   04/22/19 1050 04/22/19 1150 04/22/19 1247 04/22/19 1347  BP: 132/71 122/78 121/83   Pulse: (!) 101 100 (!) 101   Resp: (!) 22 (!) 28 (!) 25   Temp: 98.9 F (37.2 C) 98.3 F (36.8 C) 98.4 F (36.9 C)   TempSrc: Oral Oral Oral   SpO2: 95% 95% 98% 91%  Weight:      Height:        Intake/Output Summary (Last 24 hours) at 04/22/2019 1412 Last data filed at 04/22/2019 1200 Gross per 24 hour  Intake 338.5 ml  Output 620 ml  Net -281.5 ml   Filed Weights   04/20/19 1726 04/21/19 0635  Weight: 70 kg 74.8 kg    Examination: General exam: Awake, laying in bed, in nad Respiratory system: Increased respiratory effort, end-expiratory wheezing Cardiovascular system: regular rate, s1, s2 Gastrointestinal system: Soft, nondistended, positive BS Central nervous system: CN2-12 grossly intact, strength intact Extremities: Perfused, no clubbing Skin: Normal skin turgor, no notable skin lesions seen Psychiatry: Mood normal // no visual hallucinations   Data Reviewed: I have personally reviewed following labs and imaging studies  CBC: Recent Labs  Lab 04/20/19 1749 04/21/19 0748 04/22/19 0407  WBC 8.1 10.5 14.7*  HGB 12.6 11.7* 11.6*  HCT 41.0 37.9 38.5  MCV 99.3 101.3* 101.9*  PLT 345 316 [id number]   Basic Metabolic Panel: Recent Labs  Lab 04/20/19 1749 04/21/19 0748  NA 139 138  K 4.6 3.9  CL 103 108  CO2 29 24  GLUCOSE 138* 98  BUN 13 12  CREATININE 0.62 0.59  CALCIUM 12.5* 10.5*  MG   --  2.3  PHOS  --  2.4*   GFR: Estimated Creatinine Clearance: 56.4 mL/min (by C-G formula based on SCr of 0.59 mg/dL). Liver Function Tests: Recent Labs  Lab 04/20/19 1749 04/21/19 0748  AST 59* 40  ALT 25 18  ALKPHOS 112 85  BILITOT 0.8 0.9  PROT 7.1 5.9*  ALBUMIN 3.5 2.8*   Recent Labs  Lab 04/20/19 1749  LIPASE 185*   No results for input(s): AMMONIA in the last 168 hours. Coagulation Profile: Recent Labs  Lab 04/22/19 0407  INR 1.2   Cardiac Enzymes: No results for input(s): CKTOTAL, CKMB, CKMBINDEX, TROPONINI in the last 168 hours. BNP (last 3 results) No results for input(s): PROBNP in the last 8760 hours. HbA1C: No results for input(s): HGBA1C in the last 72 hours. CBG: No results for input(s): GLUCAP in the last 168 hours. Lipid Profile: Recent Labs    04/21/19 0748  CHOL 128  HDL 48  LDLCALC 70  TRIG 51  CHOLHDL 2.7   Thyroid Function Tests: Recent Labs    04/21/19 0748  TSH 0.652   Anemia Panel: No results for input(s): VITAMINB12, FOLATE, FERRITIN, TIBC, IRON, RETICCTPCT in the last 72 hours. Sepsis Labs: No results for input(s): PROCALCITON, LATICACIDVEN in the last 168 hours.  Recent Results (from the past 240 hour(s))  SARS CORONAVIRUS 2 (TAT 6-12 HRS) Nasal Swab Aptima Multi Swab     Status: None   Collection Time: 04/20/19  8:20 PM   Specimen: Aptima Multi Swab; Nasal Swab  Result Value Ref Range Status   SARS Coronavirus 2 NEGATIVE NEGATIVE Final    Comment: (NOTE) SARS-CoV-2 target nucleic acids are NOT DETECTED. The SARS-CoV-2 RNA is generally detectable in upper and lower respiratory specimens during the acute phase of infection. Negative results do not preclude SARS-CoV-2 infection, do not rule out co-infections with other pathogens, and should not be used as the sole basis for treatment or other patient management decisions. Negative results must be combined with clinical observations, patient history, and epidemiological  information. The expected result is Negative. Fact Sheet for Patients: SugarRoll.be Fact Sheet for Healthcare Providers: https://www.woods-mathews.com/ This test is not yet approved or cleared by the [place] FDA and  has been authorized for detection and/or diagnosis of SARS-CoV-2 by FDA under an Emergency Use Authorization (EUA). This EUA will remain  in effect (meaning this test can be used) for the duration of the COVID-19 declaration under Section 56 4(b)(1) of the Act, 21 U.S.C. section 360bbb-3(b)(1), unless the authorization is terminated or revoked sooner. Performed at [facility] [place] [address], [place], [place] [address]      Radiology Studies: Ct Chest W Contrast  Result Date: 04/20/2019 CLINICAL DATA:  78 year old female with known right-sided breast cancer, right-sided abdominal pain and vomiting. EXAM: CT CHEST, ABDOMEN, AND PELVIS WITH CONTRAST TECHNIQUE: Multidetector CT imaging of the chest, abdomen and pelvis was performed following the standard protocol during bolus administration of intravenous contrast. CONTRAST:  149mL OMNIPAQUE IOHEXOL 300 MG/ML  SOLN COMPARISON:  Prior CT scan of the chest 03/22/2019; prior CT scan of  the abdomen and pelvis 12/14/2017 FINDINGS: CT CHEST FINDINGS Cardiovascular: The heart is normal in size. No pericardial effusion. Calcifications visualized along the coronary arteries. No significant aortic aneurysm or evidence of dissection. The main pulmonary artery is normal in caliber and relatively well opacified. No central pulmonary embolus. Mediastinum/Nodes: Dystrophic calcification in the left adrenal gland. No definite mediastinal or hilar lymph nodes. Lungs/Pleura: Large bilateral pleural effusions with associated significant lower lobe atelectasis bilaterally. The aerated portions of the lungs remain clear. No suspicious pulmonary mass or nodule visualized. Musculoskeletal: No acute  fracture or aggressive appearing lytic or blastic osseous lesion. The right breast and chest wall are essentially completely replaced by extensive irregular soft tissue. There is marked skin thickening of the overlying breast. Soft tissue irregularity extends into the axilla and appears to encase the neurovascular bundle. Questionable involvement of the lateral aspect of the right first rib. CT ABDOMEN PELVIS FINDINGS Hepatobiliary: No focal liver abnormality is seen. Status post cholecystectomy. No biliary dilatation. Pancreas: The pancreatic head and uncinate process appear slightly indistinct, particularly compared to prior imaging. There is mild peripancreatic soft tissue stranding. Spleen: Normal in size without focal abnormality. Adrenals/Urinary Tract: The adrenal glands are unremarkable. No hydronephrosis or enhancing renal mass. No nephrolithiasis. Multiple circumscribed water attenuation simple cysts in the right kidney. Stomach/Bowel: No focal bowel wall thickening or evidence of obstruction. Vascular/Lymphatic: No aneurysm or dissection. Scattered atherosclerotic vascular calcifications. The abdominal aorta is tortuous. No suspicious lymphadenopathy. Reproductive: Dystrophic calcified uterine fibroids. No adnexal masses. Other: Trace fluid in the region of the hepato duodenal ligament. Small amount of fluid tracks along the anterior pararenal fascia on the right. Musculoskeletal: No acute fracture or aggressive appearing lytic or blastic osseous lesion. Multilevel degenerative disc disease. IMPRESSION: CT CHEST 1. Continued progression of primary right breast cancer with extensive involvement of the right chest wall, axilla and right axillary neurovascular bundle. 2. Large bilateral pleural effusions with associated significant compressive atelectasis of both lower lobes. 3. Aortic and coronary artery calcifications. Aortic Atherosclerosis (ICD10-170.0). CT ABD/PELVIS 1. CT findings are suspicious for  acute pancreatitis with indistinct and edematous pancreatic head and uncinate process and adjacent peripancreatic inflammatory stranding and trace fluid. 2. Additional ancillary findings as above. Electronically Signed   By: Jacqulynn Cadet M.D.   On: 04/20/2019 19:51   Ct Abdomen Pelvis W Contrast  Result Date: 04/20/2019 CLINICAL DATA:  78 year old female with known right-sided breast cancer, right-sided abdominal pain and vomiting. EXAM: CT CHEST, ABDOMEN, AND PELVIS WITH CONTRAST TECHNIQUE: Multidetector CT imaging of the chest, abdomen and pelvis was performed following the standard protocol during bolus administration of intravenous contrast. CONTRAST:  136mL OMNIPAQUE IOHEXOL 300 MG/ML  SOLN COMPARISON:  Prior CT scan of the chest 03/22/2019; prior CT scan of the abdomen and pelvis 12/14/2017 FINDINGS: CT CHEST FINDINGS Cardiovascular: The heart is normal in size. No pericardial effusion. Calcifications visualized along the coronary arteries. No significant aortic aneurysm or evidence of dissection. The main pulmonary artery is normal in caliber and relatively well opacified. No central pulmonary embolus. Mediastinum/Nodes: Dystrophic calcification in the left adrenal gland. No definite mediastinal or hilar lymph nodes. Lungs/Pleura: Large bilateral pleural effusions with associated significant lower lobe atelectasis bilaterally. The aerated portions of the lungs remain clear. No suspicious pulmonary mass or nodule visualized. Musculoskeletal: No acute fracture or aggressive appearing lytic or blastic osseous lesion. The right breast and chest wall are essentially completely replaced by extensive irregular soft tissue. There is marked skin thickening of the overlying breast.  Soft tissue irregularity extends into the axilla and appears to encase the neurovascular bundle. Questionable involvement of the lateral aspect of the right first rib. CT ABDOMEN PELVIS FINDINGS Hepatobiliary: No focal liver  abnormality is seen. Status post cholecystectomy. No biliary dilatation. Pancreas: The pancreatic head and uncinate process appear slightly indistinct, particularly compared to prior imaging. There is mild peripancreatic soft tissue stranding. Spleen: Normal in size without focal abnormality. Adrenals/Urinary Tract: The adrenal glands are unremarkable. No hydronephrosis or enhancing renal mass. No nephrolithiasis. Multiple circumscribed water attenuation simple cysts in the right kidney. Stomach/Bowel: No focal bowel wall thickening or evidence of obstruction. Vascular/Lymphatic: No aneurysm or dissection. Scattered atherosclerotic vascular calcifications. The abdominal aorta is tortuous. No suspicious lymphadenopathy. Reproductive: Dystrophic calcified uterine fibroids. No adnexal masses. Other: Trace fluid in the region of the hepato duodenal ligament. Small amount of fluid tracks along the anterior pararenal fascia on the right. Musculoskeletal: No acute fracture or aggressive appearing lytic or blastic osseous lesion. Multilevel degenerative disc disease. IMPRESSION: CT CHEST 1. Continued progression of primary right breast cancer with extensive involvement of the right chest wall, axilla and right axillary neurovascular bundle. 2. Large bilateral pleural effusions with associated significant compressive atelectasis of both lower lobes. 3. Aortic and coronary artery calcifications. Aortic Atherosclerosis (ICD10-170.0). CT ABD/PELVIS 1. CT findings are suspicious for acute pancreatitis with indistinct and edematous pancreatic head and uncinate process and adjacent peripancreatic inflammatory stranding and trace fluid. 2. Additional ancillary findings as above. Electronically Signed   By: Jacqulynn Cadet M.D.   On: 04/20/2019 19:51   Dg Chest Port 1 View  Result Date: 04/22/2019 CLINICAL DATA:  Shortness of breath, weakness EXAM: PORTABLE CHEST 1 VIEW COMPARISON:  04/20/2019 FINDINGS: Moderate bilateral  pleural effusions, left greater than right. Diffuse bibasilar airspace opacities, largely unchanged. Cardiomediastinal contour is obscured. No pneumothorax. IMPRESSION: Moderate bilateral pleural effusions, left greater than right, with associated bibasilar opacities. Findings not significantly changed compared to prior. Electronically Signed   By: Davina Poke M.D.   On: 04/22/2019 08:57   Dg Chest Port 1 View  Result Date: 04/20/2019 CLINICAL DATA:  78 year old female with shortness of breath EXAM: PORTABLE CHEST 1 VIEW COMPARISON:  Prior chest x-ray 04/14/2019 FINDINGS: Small to moderate bilateral layering pleural effusions larger on the left than the right. The cardiac contours are largely obscured. Suspect underlying cardiomegaly. Atherosclerotic calcifications present in the transverse aorta. Pulmonary vascular congestion is present. No evidence of pneumothorax. No acute osseous abnormality. Degenerative changes at the left glenohumeral joint. Soft tissue anchors in the right humeral head from prior rotator cuff repair. IMPRESSION: 1. Small to moderate bilateral layering pleural effusions, larger on the left than the right. 2. Pulmonary vascular congestion with mild pulmonary edema. 3.  Aortic Atherosclerosis (ICD10-170.0) Electronically Signed   By: Jacqulynn Cadet M.D.   On: 04/20/2019 18:49    Scheduled Meds:  feeding supplement  1 Container Oral BID BM   feeding supplement (PRO-STAT SUGAR FREE 64)  30 mL Oral TID BM   ipratropium-albuterol  3 mL Nebulization Q6H   methylPREDNISolone (SOLU-MEDROL) injection  40 mg Intravenous Daily   multivitamin with minerals  1 tablet Oral Daily   Continuous Infusions:    LOS: 2 days   Marylu Lund, MD [facility] Pager On Amion  If 7PM-7AM, please contact night-coverage 04/22/2019, 2:12 PM

## 2019-04-22 NOTE — Progress Notes (Signed)
   04/22/19 2058  Vitals  Temp 97.6 F (36.4 C)  Temp Source Axillary  BP 113/65  MAP (mmHg) 81  BP Location Left Arm  BP Method Automatic  Patient Position (if appropriate) Lying  Pulse Rate (!) 106  Pulse Rate Source Dinamap  Resp (!) 24  Oxygen Therapy  SpO2 93 %  O2 Device Nasal Cannula  O2 Flow Rate (L/min) 2 L/min  MEWS Score  MEWS RR 1  MEWS Pulse 1  MEWS Systolic 0  MEWS LOC 0  MEWS Temp 0  MEWS Score 2  MEWS Score Color Yellow  MEWS Assessment  Is this an acute change? No  MEWS guidelines implemented *See Row Information* Yellow   MD made aware

## 2019-04-22 NOTE — Care Management Important Message (Signed)
Important Message  Patient Details IM Letter given to Cookie McGibboney RN to present to the Patient Name: Peggy Welch MRN: [id number] Date of Birth: 20-Aug-1941   Medicare Important Message Given:  Yes     Kerin Salen 04/22/2019, 11:16 AM

## 2019-04-22 NOTE — Significant Event (Signed)
Rapid Response Event Note  Overview: Time Called: [id number] Arrival Time: 0800 Event Type: Respiratory Notified by bedside RN in regards to patient's MEWs score at 5 due to increased HR and RR. Patient's HR upper 130s and RR 38. Respiratory Therapist, Tammy, providing a breathing treatment. Upon arrival, patient appeared to be improving from breathing treatment provided. Patient alert, oriented and able to follow commands.   Initial Focused Assessment: Neuro: Alert and oriented, patient is hard of hearing. Patient able to answer question but still short of breath. Patient able to move all extremities but did not encourage to conserve patient's energy for breathing. Cardiac: HR ST, 110s-119, S1 and S2 heard upon auscultation. BP slightly HTN-see vital signs. Pulmonary: Upper breath sounds-expiratory wheezing, lower breath sounds diminished bilaterally. Wheezing appeared to improve much after breathing treatment provided, RR decreased to lower 30s. O2 Sats did not decrease-93-95% on 3LNC.   Interventions: Notified MD Wyline Copas -MD coming to assess at bedside. -MD ordered CXR  Plan of Care (if not transferred): Patient is scheduled for pleurex catheter by IR for pleural effusion. If patient has another episode or increased WOB would recommend transferring to SDU. At this time, patient appears to be stable comfortable. WOB has decreased significantly. Continue to monitor and assess on telemetry floor for now. Call Rapid Response if patient appears to be going into respiratory distress or has a clinical status change.        Peggy Welch C

## 2019-04-22 NOTE — Progress Notes (Signed)
   04/22/19 0757  MEWS Assessment  Is this an acute change? Yes  MEWS guidelines implemented *See [place]  Rapid Response Notification  Name of Rapid Response RN Notified Christian  Date Rapid Response Notified 04/22/19  Time Rapid Response Notified [id number]  Provider Notification  Provider Name/Title Wyline Copas  Date Provider Notified 04/22/19  Time Provider Notified [phone]  Notification Type Page  Notification Reason Change in status

## 2019-04-22 NOTE — Progress Notes (Signed)
ANTICOAGULATION CONSULT NOTE - Follow Up Consult  Pharmacy Consult for Heparin Indication: History of PE  Allergies  Allergen Reactions  . 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    Swelling in throat    Patient Measurements: Height: 5\' 4"  (162.6 cm) Weight: 165 lb (74.8 kg) IBW/kg (Calculated) : 54.7 Heparin Dosing Weight:   Vital Signs: Temp: 98.8 F (37.1 C) (08/28 0525) Temp Source: Oral (08/28 0525) BP: 106/66 (08/28 0525) Pulse Rate: 106 (08/28 0525)  Labs: Recent Labs    04/20/19 1749 04/21/19 0748 04/21/19 1558 04/22/19 0407  HGB 12.6 11.7*  --  11.6*  HCT 41.0 37.9  --  38.5  PLT 345 316  --  331  APTT  --  75* 76* 98*  LABPROT  --   --   --  14.9  INR  --   --   --  1.2  HEPARINUNFRC  --  0.67 0.51 0.59  CREATININE 0.62 0.59  --   --     Estimated Creatinine Clearance: 56.4 mL/min (by C-G formula based on SCr of 0.59 mg/dL).   Medications:  Infusions:  . heparin 1,000 Units/hr (04/22/19 0315)    Assessment: Patient with PTT and Heparin level at goal.  Therefore will only use heparin level going forward.  No heparin issues noted.  Goal of Therapy:  Heparin level 0.3-0.7 units/ml Monitor platelets by anticoagulation protocol: Yes   Plan:  Continue heparin drip at current rate Recheck level AM labs  Tyler Deis, Shea Stakes [place] 04/22/2019,6:17 AM

## 2019-04-22 NOTE — Patient Care Conference (Signed)
Called and updated patient's sister over phone. All questions were answered.  

## 2019-04-22 NOTE — Progress Notes (Signed)
   04/22/19 0805  MEWS Score  Resp (!) 32  Pulse Rate (!) 118  BP 137/77  Temp 98.9 F (37.2 C)  SpO2 94 %  O2 Device Nasal Cannula  O2 Flow Rate (L/min) 3 L/min  MEWS Score  MEWS RR 2  MEWS Pulse 2  MEWS Systolic 0  MEWS LOC 0  MEWS Temp 0  MEWS Score 4  MEWS Score Color Red  Provider Notification  Response See new orders  Date of Provider Response 04/22/19  Time of Provider Response [phone]

## 2019-04-22 NOTE — Progress Notes (Signed)
MEDICATION-RELATED CONSULT NOTE   IR Procedure Consult - Anticoagulant/Antiplatelet PTA/Inpatient Med List Review by Pharmacist    Procedure: Placement of a LEFT tunneled pleural drainage catheter    Completed: 8/28 @ 1621  Post-Procedural bleeding risk per IR MD assessment:  LOW  Antithrombotic medications on inpatient or PTA profile prior to procedure:     IV heparin bridge while off Xarelto for procedure  Recommended restart time per IR Post-Procedure Guidelines:   4h post-op  Other considerations:     Plan:     Resume heparin 1000 units/hr at 2030 tonight Check heparin level 8h after starting  Daily heparin level & CBC while on heparin.  Netta Cedars, PharmD, BCPS 04/22/2019@4 :57 PM

## 2019-04-23 LAB — CBC
HCT: 38.7 % (ref 36.0–46.0)
Hemoglobin: 11.5 g/dL — ABNORMAL LOW (ref 12.0–15.0)
MCH: 30.1 pg (ref 26.0–34.0)
MCHC: 29.7 g/dL — ABNORMAL LOW (ref 30.0–36.0)
MCV: 101.3 fL — ABNORMAL HIGH (ref 80.0–100.0)
Platelets: 358 10*3/uL (ref 150–400)
RBC: 3.82 MIL/uL — ABNORMAL LOW (ref 3.87–5.11)
RDW: 13.9 % (ref 11.5–15.5)
WBC: 13.6 10*3/uL — ABNORMAL HIGH (ref 4.0–10.5)
nRBC: 0 % (ref 0.0–0.2)

## 2019-04-23 LAB — HEPARIN LEVEL (UNFRACTIONATED)
Heparin Unfractionated: 0.26 IU/mL — ABNORMAL LOW (ref 0.30–0.70)
Heparin Unfractionated: 0.54 IU/mL (ref 0.30–0.70)

## 2019-04-23 MED ORDER — RIVAROXABAN 20 MG PO TABS
20.0000 mg | ORAL_TABLET | Freq: Every day | ORAL | Status: DC
  Administered 2019-04-23 – 2019-04-24 (×2): 20 mg via ORAL
  Filled 2019-04-23 (×2): qty 1

## 2019-04-23 MED ORDER — SODIUM CHLORIDE 0.9 % IV BOLUS
250.0000 mL | Freq: Once | INTRAVENOUS | Status: AC
  Administered 2019-04-23: 250 mL via INTRAVENOUS

## 2019-04-23 MED ORDER — SODIUM CHLORIDE 0.9 % IV SOLN
INTRAVENOUS | Status: DC
  Administered 2019-04-23 – 2019-04-24 (×2): via INTRAVENOUS

## 2019-04-23 MED ORDER — LIDOCAINE-PRILOCAINE 2.5-2.5 % EX CREA
TOPICAL_CREAM | CUTANEOUS | Status: DC | PRN
  Filled 2019-04-23: qty 5

## 2019-04-23 NOTE — Progress Notes (Signed)
PROGRESS NOTE    Peggy Welch  [id number] DOB: [birth year] DOA: 04/20/2019 PCP: Lucianne Lei, MD    Brief Narrative:  78 y.o. female with medical history significant of breast cancer metastases, pleural effusion metastatic, PE on Xarelto, hard of hearing, chronic hypoxia on oxygen, asthma, hepatitis C from blood transfusion, HTN Dysphasia on dysphagia 2 diet, hypercalcemia  Admitted for pancreatitis  Assessment & Plan:   Active Problems:   HTN (hypertension)   Breast cancer of lower-inner quadrant of right female breast ([place])   Hypercalcemia of malignancy   Pulmonary embolism (HCC)   Dysphagia   Personal history of venous thrombosis and embolism   Pancreatitis   Pressure injury of skin  Present on Admission: . Pancreatitis -unclear etiology  -CT of abdomen with findings suggesting possible acute pancreatitis  no evidence of pseudocyst -per my own read of CT, stool noted along ascending colon, corresponding to location of patient's abd pain -Pt reports recent BM with much improvement in abd discomfort -Tolerating diet    . HTN (hypertension)  -BP presently stable at this time  . Breast cancer of lower-inner quadrant of right female breast [facility])  -appreciate input by Oncology. Recommendation for Pleur-x, consideration for zometa.  -Pt was referred to Palliative Care. Discussed case with Dr. Hilma Favors, appreciate input -Plan for family meeting with Palliative Care this afternoon  . Hypercalcemia of malignancy  -Ca improved from 12.5 to 10.5 after hydration  . Pulmonary embolism (HCC)  -Pt now s/p Pleurx placement, resumed xarelto per pharmacy dosing  Pleural effusion  -patient is interested in Pleurx catheter placement, was recommended by Oncology -IR was consulted and pt now s/p Pleurx placement on 8/28 -Clinically no much improved  DVT prophylaxis: xarelto Code Status: DNR Family Communication: Pt in room, family not at bedside, Discussed with patient's  sister over phone 8/28 Disposition Plan: Uncertain at this time  Consultants:   Oncology  IR  Palliative Care  Procedures:     Antimicrobials: Anti-infectives (From admission, onward)   Start     Dose/Rate Route Frequency Ordered Stop   04/22/19 1532  ceFAZolin (ANCEF) 2-4 GM/100ML-% IVPB    Note to Pharmacy: Hilma Favors   : cabinet override      04/22/19 1532 04/22/19 1550      Subjective: No complaints this AM. Pt reports feeling much better  Objective: Vitals:   04/23/19 0843 04/23/19 1101 04/23/19 1252 04/23/19 1415  BP:  109/62 107/72 108/72  Pulse:  98 91 98  Resp:   20   Temp:   97.8 F (36.6 C)   TempSrc:   Axillary   SpO2: 94% 94% 96% 98%  Weight:      Height:        Intake/Output Summary (Last 24 hours) at 04/23/2019 1545 Last data filed at 04/23/2019 1430 Gross per 24 hour  Intake 412.05 ml  Output 1051 ml  Net -638.95 ml   Filed Weights   04/20/19 1726 04/21/19 0635  Weight: 70 kg 74.8 kg    Examination: General exam: Conversant, in no acute distress Respiratory system: normal chest rise, clear, no audible wheezing Cardiovascular system: regular rhythm, s1-s2 Gastrointestinal system: Nondistended, nontender, pos BS Central nervous system: No seizures, no tremors Extremities: No cyanosis, no joint deformities Skin: No rashes, no pallor Psychiatry: Affect normal // no auditory hallucinations   Data Reviewed: I have personally reviewed following labs and imaging studies  CBC: Recent Labs  Lab 04/20/19 1749 04/21/19 0748 04/22/19 0407 04/23/19 0705  WBC  8.1 10.5 14.7* 13.6*  HGB 12.6 11.7* 11.6* 11.5*  HCT 41.0 37.9 38.5 38.7  MCV 99.3 101.3* 101.9* 101.3*  PLT 345 316 331 [id number]   Basic Metabolic Panel: Recent Labs  Lab 04/20/19 1749 04/21/19 0748  NA 139 138  K 4.6 3.9  CL 103 108  CO2 29 24  GLUCOSE 138* 98  BUN 13 12  CREATININE 0.62 0.59  CALCIUM 12.5* 10.5*  MG  --  2.3  PHOS  --  2.4*   GFR: Estimated  Creatinine Clearance: 56.4 mL/min (by C-G formula based on SCr of 0.59 mg/dL). Liver Function Tests: Recent Labs  Lab 04/20/19 1749 04/21/19 0748  AST 59* 40  ALT 25 18  ALKPHOS 112 85  BILITOT 0.8 0.9  PROT 7.1 5.9*  ALBUMIN 3.5 2.8*   Recent Labs  Lab 04/20/19 1749  LIPASE 185*   No results for input(s): AMMONIA in the last 168 hours. Coagulation Profile: Recent Labs  Lab 04/22/19 0407  INR 1.2   Cardiac Enzymes: No results for input(s): CKTOTAL, CKMB, CKMBINDEX, TROPONINI in the last 168 hours. BNP (last 3 results) No results for input(s): PROBNP in the last 8760 hours. HbA1C: No results for input(s): HGBA1C in the last 72 hours. CBG: No results for input(s): GLUCAP in the last 168 hours. Lipid Profile: Recent Labs    04/21/19 0748  CHOL 128  HDL 48  LDLCALC 70  TRIG 51  CHOLHDL 2.7   Thyroid Function Tests: Recent Labs    04/21/19 0748  TSH 0.652   Anemia Panel: No results for input(s): VITAMINB12, FOLATE, FERRITIN, TIBC, IRON, RETICCTPCT in the last 72 hours. Sepsis Labs: No results for input(s): PROCALCITON, LATICACIDVEN in the last 168 hours.  Recent Results (from the past 240 hour(s))  SARS CORONAVIRUS 2 (TAT 6-12 HRS) Nasal Swab Aptima Multi Swab     Status: None   Collection Time: 04/20/19  8:20 PM   Specimen: Aptima Multi Swab; Nasal Swab  Result Value Ref Range Status   SARS Coronavirus 2 NEGATIVE NEGATIVE Final    Comment: (NOTE) SARS-CoV-2 target nucleic acids are NOT DETECTED. The SARS-CoV-2 RNA is generally detectable in upper and lower respiratory specimens during the acute phase of infection. Negative results do not preclude SARS-CoV-2 infection, do not rule out co-infections with other pathogens, and should not be used as the sole basis for treatment or other patient management decisions. Negative results must be combined with clinical observations, patient history, and epidemiological information. The expected result is  Negative. Fact Sheet for Patients: SugarRoll.be Fact Sheet for Healthcare Providers: https://www.woods-mathews.com/ This test is not yet approved or cleared by the [place] FDA and  has been authorized for detection and/or diagnosis of SARS-CoV-2 by FDA under an Emergency Use Authorization (EUA). This EUA will remain  in effect (meaning this test can be used) for the duration of the COVID-19 declaration under Section 56 4(b)(1) of the Act, 21 U.S.C. section 360bbb-3(b)(1), unless the authorization is terminated or revoked sooner. Performed at [facility], [place] [address] [place], [place] [address]      Radiology Studies: Dg Chest [facility] 1 View  Result Date: 04/22/2019 CLINICAL DATA:  Shortness of breath, weakness EXAM: PORTABLE CHEST 1 VIEW COMPARISON:  04/20/2019 FINDINGS: Moderate bilateral pleural effusions, left greater than right. Diffuse bibasilar airspace opacities, largely unchanged. Cardiomediastinal contour is obscured. No pneumothorax. IMPRESSION: Moderate bilateral pleural effusions, left greater than right, with associated bibasilar opacities. Findings not significantly changed compared to prior. Electronically  Signed   By: Davina Poke M.D.   On: 04/22/2019 08:57   Ir Perc Pleural Drain W/indwell Cath W/img Guide  Result Date: 04/22/2019 INDICATION: 78 year old female with advanced metastatic right-sided breast cancer and bilateral pleural effusions. She presents today for placement of a left-sided tunneled pleural drainage catheter. EXAM: IR PERC PLEURAL DRAIN W/INDWELL CATH W/IMG GUIDE MEDICATIONS: 2 g Ancef ANESTHESIA/SEDATION: 1 mg Versed.  This is not conscious sedation. COMPLICATIONS: None immediate. PROCEDURE: Informed written consent was obtained from the patient after a thorough discussion of the procedural risks, benefits and alternatives. All questions were addressed. Maximal Sterile Barrier Technique was utilized  including caps, mask, sterile gowns, sterile gloves, sterile drape, hand hygiene and skin antiseptic. A timeout was performed prior to the initiation of the procedure. Ultrasound was used to interrogate the left chest. There is a large pleural effusion. A suitable skin entry site was selected and marked. Following sterile prep and drape with chlorhexidine, local anesthesia was attained by infiltration with 1% lidocaine. A small dermatotomy was made. An 18 gauge sheath needle was then advanced through the dermatotomy and into the pleural fluid. The needle was removed while the sheath was left in the pleural space. A short Amplatz wire was advanced into the pleural space. A skin exit site approximately 5 cm anterior and medial to the pleural entry site was selected. Local anesthesia was attained by infiltration with 1% lidocaine. A second small dermatotomy was made. The PleurX catheter was then tunneled from the skin exit site to the dermatotomy overlying the pleural entry site. A peel-away sheath was then advanced over the wire into the pleural space. The PleurX catheter was advanced through the peel-away sheath. The peel-away sheath was discarded. The catheter was connected to suction and aspiration performed. The catheter was secured to the skin with 0 Prolene suture. The dermatotomy overlying the pleural entry site was sealed with Dermabond. IMPRESSION: Successful placement of a left-sided tunneled pleural drainage catheter. Signed, Criselda Peaches, MD, [facility] Electronically Signed   By: Jacqulynn Cadet M.D.   On: 04/22/2019 16:25    Scheduled Meds: . feeding supplement  1 Container Oral BID BM  . feeding supplement (PRO-STAT SUGAR FREE 64)  30 mL Oral TID BM  . ipratropium-albuterol  3 mL Nebulization TID  . methylPREDNISolone (SOLU-MEDROL) injection  40 mg Intravenous Daily  . multivitamin with minerals  1 tablet Oral Daily  .  rivaroxaban  20 mg Oral Q supper   Continuous Infusions: . heparin 1,100 Units/hr (04/23/19 1425)     LOS: 3 days   Marylu Lund, MD [facility] Pager On Amion  If 7PM-7AM, please contact night-coverage 04/23/2019, 3:45 PM

## 2019-04-23 NOTE — Progress Notes (Addendum)
ANTICOAGULATION CONSULT NOTE - Follow Up Consult  Pharmacy Consult for Heparin Indication: History of PE  Allergies  Allergen Reactions  . 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    Swelling in throat    Patient Measurements: Height: 5\' 4"  (162.6 cm) Weight: 165 lb (74.8 kg) IBW/kg (Calculated) : 54.7 Heparin Dosing Weight: 70.3  Vital Signs: Temp: 98 F (36.7 C) (08/29 0341) Temp Source: Oral (08/29 0341) BP: 99/65 (08/29 0341) Pulse Rate: 96 (08/29 0341)  Labs: Recent Labs    04/20/19 1749  04/21/19 0748 04/21/19 1558 04/22/19 0407 04/23/19 0705  HGB 12.6  --  11.7*  --  11.6* 11.5*  HCT 41.0  --  37.9  --  38.5 38.7  PLT 345  --  316  --  331 358  APTT  --   --  75* 76* 98*  --   LABPROT  --   --   --   --  14.9  --   INR  --   --   --   --  1.2  --   HEPARINUNFRC  --    < > 0.67 0.51 0.59 0.26*  CREATININE 0.62  --  0.59  --   --   --    < > = values in this interval not displayed.    Estimated Creatinine Clearance: 56.4 mL/min (by C-G formula based on SCr of 0.59 mg/dL).   Medications:  Scheduled:  . feeding supplement  1 Container Oral BID BM  . feeding supplement (PRO-STAT SUGAR FREE 64)  30 mL Oral TID BM  . ipratropium-albuterol  3 mL Nebulization TID  . methylPREDNISolone (SOLU-MEDROL) injection  40 mg Intravenous Daily  . multivitamin with minerals  1 tablet Oral Daily    Assessment: Peggy Welch is a 78 year old female with history of DVT; PTA xarelto which was held upon admission and patient was started on heparin; PTT and heparin correlated on 8/28 at 6am; heparin held later that day for placement of a LEFT tunneled pleural drainage catheter. Completed at 1621. Heparin was restarted and FIRST level since restart came back this morning slightly subtherapeutic at 0.26. Will need to increase slightly to get into therapeutic range. No signs or symptoms of bleeding noted. CBC stable. Patients heparin levels were previously therapeutic on 1000 units /  hr prior to procedure. Will increase slightly.  Goal of Therapy:  Heparin level 0.3-0.7 units/ml Monitor platelets by anticoagulation protocol: Yes   Plan:  Increase heparin infusion at 1100 units/hr Check heparin level in 8 hours at 1400 and daily while on heparin Continue to monitor H&H and platelets Follow-up transition back to home med of xarelto    Thank you for the interesting consult and for involving pharmacy in this patient's care.  Tamela Gammon, PharmD 04/23/2019 8:02 AM PGY-2 Pharmacy Administration Resident Direct Phone: [phone] Please check [email or web address] for unit-specific pharmacist phone numbers

## 2019-04-23 NOTE — Patient Care Conference (Signed)
Called and updated patient's daughter, Marcie Bal. All questions answered. Marcie Bal is aware of upcoming family meeting with Palliative Care and is eager to have multiple family members involved in that conversation.

## 2019-04-23 NOTE — Progress Notes (Signed)
ANTICOAGULATION CONSULT NOTE - Follow Up Consult  Pharmacy Consult for Heparin Indication: History of PE  Allergies  Allergen Reactions  . 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    Swelling in throat    Patient Measurements: Height: 5\' 4"  (162.6 cm) Weight: 165 lb (74.8 kg) IBW/kg (Calculated) : 54.7 Heparin Dosing Weight: 70.3  Vital Signs: Temp: 97.8 F (36.6 C) (08/29 1252) Temp Source: Axillary (08/29 1252) BP: 108/72 (08/29 1415) Pulse Rate: 98 (08/29 1415)  Labs: Recent Labs    04/20/19 1749  04/21/19 0748 04/21/19 1558 04/22/19 0407 04/23/19 0705 04/23/19 1348  HGB 12.6  --  11.7*  --  11.6* 11.5*  --   HCT 41.0  --  37.9  --  38.5 38.7  --   PLT 345  --  316  --  331 358  --   APTT  --   --  75* 76* 98*  --   --   LABPROT  --   --   --   --  14.9  --   --   INR  --   --   --   --  1.2  --   --   HEPARINUNFRC  --    < > 0.67 0.51 0.59 0.26* 0.54  CREATININE 0.62  --  0.59  --   --   --   --    < > = values in this interval not displayed.    Estimated Creatinine Clearance: 56.4 mL/min (by C-G formula based on SCr of 0.59 mg/dL).   Medications:  Scheduled:  . feeding supplement  1 Container Oral BID BM  . feeding supplement (PRO-STAT SUGAR FREE 64)  30 mL Oral TID BM  . ipratropium-albuterol  3 mL Nebulization TID  . methylPREDNISolone (SOLU-MEDROL) injection  40 mg Intravenous Daily  . multivitamin with minerals  1 tablet Oral Daily    Assessment: Peggy Welch is a 78 year old female with history of DVT on Xarelto PTA. xarelto was held upon admission and patient was started on heparin;  8/28  LEFT tunneled pleural drainage catheter placed. Heparin resumed 4 hours post procedure.   04/23/2019 Heparin level therapeutic at 0.54 after rate increased from 1000 to 1100 units/hr.  No bleeding reported.  Goal of Therapy:  Heparin level 0.3-0.7 units/ml Monitor platelets by anticoagulation protocol: Yes   Plan:  D/w Dr Wyline Copas- OK to resume home  xarelto today DC heparin drip at 1700 Dc CBCs and  and heparin levels Xarelto 20 mg qsupper Pharmacy to sign off  Eudelia Bunch, Pharm.D [phone] 04/23/2019 3:07 PM

## 2019-04-24 DIAGNOSIS — Z515 Encounter for palliative care: Secondary | ICD-10-CM

## 2019-04-24 MED ORDER — IPRATROPIUM-ALBUTEROL 0.5-2.5 (3) MG/3ML IN SOLN: 3.0000 mL | Freq: Two times a day (BID) | RESPIRATORY_TRACT | Status: DC

## 2019-04-24 MED ORDER — HYDROCODONE-ACETAMINOPHEN 5-325 MG PO TABS: 1.0000 | ORAL_TABLET | ORAL | 0 refills | Status: AC | PRN

## 2019-04-24 MED ORDER — POLYETHYLENE GLYCOL 3350 17 G PO PACK: 17.0000 g | PACK | Freq: Every day | ORAL | 0 refills | Status: AC

## 2019-04-24 MED ORDER — POLYETHYLENE GLYCOL 3350 17 G PO PACK
17.0000 g | PACK | Freq: Every day | ORAL | Status: DC
  Administered 2019-04-24: 17 g via ORAL
  Filled 2019-04-24: qty 1

## 2019-04-24 NOTE — Progress Notes (Signed)
Palliative Care Follow up Note  Spoke with patient's daughter Peggy Welch at length about her mother's condition and recommendations for her care following discharge. Given her cancer progression, strong desire to remain at home, no additional cancer treatment options and the complex needs associated with her new Pluerx drain Hospice Care is the best option for her following this hospitalization. Peggy Welch is in full agreement but was confronted with significant push back from her other family members when she told them we were recommending hospice. The family is in crisis over this choice and according to Peggy Welch many are in denial that her mothers cancer is as bad as we say it is or even present at all. They are all in some way going to be involved with her care after the hospitalization. Peggy Welch feels like they need to hear this information from a physician and have their questions answered- I offered to have a family meeting but there are over 5 people who need to be present and several of them are elderly and have poor hearing which makes video conferencing difficult and this is too many people to bring into the hospital with current restrictions in place. I have agreed to meet them at the patients home tomorrow to discuss the hospice option- I will contact the hospice liaison and see if they would also like to attend- there may be education opportunities.  Lane Hacker, DO Palliative Medicine

## 2019-04-24 NOTE — Progress Notes (Signed)
PleurX catheter drained via sterile technique. Only able to drain 734mL of dark yellow fluid due to patient requesting me to stop procedure for pain. Attempted to slow down drain and take a break, however, patient was not tolerating well and required RN to stop procedure. Vitals were obtained. Patient assisted into recliner chair. Comfortable. Notified Dr. Wyline Copas via text page.

## 2019-04-24 NOTE — Discharge Summary (Signed)
Physician Discharge Summary  Peggy Welch [id number] DOB: March 07, 1941 DOA: 04/20/2019  PCP: Lucianne Lei, MD  Admit date: 04/20/2019 Discharge date: 04/24/2019  Admitted From: Home Disposition:  Home  Recommendations for Outpatient Follow-up:  1. Follow up with PCP in 1-2 weeks 2. Follow up with Palliative Care as scheduled  [place] reviewed. Limited quantity of narcotic will be prescribed for post-procedure pain as well as for palliative purposes. Additional refills will be deferred to primary Oncologist  Home Health:PT, RN, aide   Discharge Condition:Improved CODE STATUS:DNR Diet recommendation: Dysphagia 2   Brief/Interim Summary: 78 y.o.femalewith medical history significant ofbreast cancer metastases, pleural effusion metastatic, PE on Xarelto, hard of hearing, chronic hypoxia on oxygen, asthma, hepatitis C from blood transfusion, HTN Dysphasia on dysphagia 2 diet,hypercalcemia Admitted for pancreatitis  Discharge Diagnoses:  Active Problems:   HTN (hypertension)   Breast cancer of lower-inner quadrant of right female breast ([place])   Hypercalcemia of malignancy   Pulmonary embolism (HCC)   Dysphagia   Personal history of venous thrombosis and embolism   Pancreatitis   Pressure injury of skin  Present on Admission: .Pancreatitis -unclear etiology  -CT of abdomen with findings suggesting possible acute pancreatitis no evidence of pseudocyst -per my own read of CT, stool noted along ascending colon, corresponding to location of patient's abd pain -Pt reports recent BM with much improvement in abd discomfort -Tolerating diet, Dysphagia 2  .HTN (hypertension)  -BP presently stable at this time  .Breast cancer of lower-inner quadrant of right female breast [facility])  -appreciate input by Oncology. Recommendation for Pleur-x, since placed by IR -Pt was referred to Palliative Care. Discussed case with Dr. Hilma Favors, appreciate input. -Difficult situation as there  appears to be much denial among multiple family members, thus causing much tension and disagreement. Per Palliative Care, family meeting to be conducted after d/c home at pt's residence tomorrow  .Hypercalcemia of malignancy  -Ca improved with hydration  .Pulmonary embolism (HCC)  -Pt now s/p Pleurx placement, resumed xarelto per pharmacy dosing  Pleural effusion -patient is interested in Pleurx catheter placement, was recommended by Oncology -IR was consulted and pt now s/p Pleurx placement on 8/28 -Clinically no much improved   Discharge Instructions   Allergies as of 04/24/2019      Reactions   Shrimp [shellfish Allergy] Anaphylaxis   Swelling in throat      Medication List    STOP taking these medications   acetaminophen-codeine 300-30 MG tablet Commonly known as: TYLENOL #3     TAKE these medications   acetaminophen 650 MG CR tablet Commonly known as: TYLENOL Take 650 mg by mouth every 8 (eight) hours as needed for pain (pain).   albuterol (2.5 MG/3ML) 0.083% nebulizer solution Commonly known as: PROVENTIL Take 3 mLs (2.5 mg total) by nebulization every 4 (four) hours as needed for wheezing or shortness of breath (wheezing).   anastrozole 1 MG tablet Commonly known as: ARIMIDEX TAKE 1 TABLET(1 MG) BY MOUTH DAILY What changed: See the new instructions.   B-complex with vitamin C tablet Take 1 tablet by mouth daily.   citalopram 10 MG tablet Commonly known as: CELEXA Take 10 mg by mouth every morning.   HYDROcodone-acetaminophen 5-325 MG tablet Commonly known as: NORCO/VICODIN Take 1-2 tablets by mouth every 4 (four) hours as needed for moderate pain.   mirtazapine 7.5 MG tablet Commonly known as: REMERON Take 1 tablet (7.5 mg total) by mouth at bedtime.   multivitamin with minerals Tabs tablet Take 1 tablet by  mouth daily.   polyethylene glycol 17 g packet Commonly known as: MIRALAX / GLYCOLAX Take 17 g by mouth daily.   potassium chloride  SA 20 MEQ tablet Commonly known as: K-DUR Take 1 tablet (20 mEq total) by mouth 2 (two) times daily.   rivaroxaban 20 MG Tabs tablet Commonly known as: XARELTO Take 20 mg by mouth daily with supper.      Follow-up Information    [facility] Follow up.   Specialty: Home Health Services Contact information: [place] [id number] [phone]          Allergies  Allergen Reactions  . 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    Swelling in throat    Consultations:  IR  Oncology  Palliative Care  Procedures/Studies: Dg Chest 1 View  Result Date: 04/14/2019 CLINICAL DATA:  78 year old female with a history of malignant effusion, status post thoracentesis EXAM: CHEST  1 VIEW COMPARISON:  04/10/2019 FINDINGS: Cardiomediastinal silhouette unchanged in size and contour, partially obscured by overlying lung and pleural disease. Improved aeration of the right lung status post thoracentesis with decreased pleural fluid. Partial obscuration the right hemidiaphragm. Persisting opacity at the left lung base with partial obscuration left hemidiaphragm and the left heart border. No pneumothorax. IMPRESSION: Decreased right-sided pleural fluid status post thoracentesis with no visualized pneumothorax. Small left pleural effusion with associated atelectasis/consolidation. Electronically Signed   By: Corrie Mckusick D.O.   On: 04/14/2019 10:59   Dg Chest 1 View  Result Date: 04/10/2019 CLINICAL DATA:  Status post left-sided thoracentesis. EXAM: CHEST  1 VIEW COMPARISON:  A 16 2020 FINDINGS: The patient has undergone left-sided thoracentesis. There is no evidence for left-sided pneumothorax. There is a small left-sided pleural effusion, substantially improved from prior study. There is a persistent moderate to large right-sided pleural effusion. The heart size is stable. There is presumed atelectasis at the lung bases. There are advanced degenerative changes of  both glenohumeral joints. There is no right-sided pneumothorax. There is no definite acute osseous abnormality. IMPRESSION: 1. No pneumothorax status post left-sided thoracentesis. 2. Small residual left-sided pleural effusion, significantly improved from prior study. 3. Persistent moderate to large right-sided pleural effusion. 4. Bibasilar airspace opacities, favored to represent atelectasis. Electronically Signed   By: Constance Holster M.D.   On: 04/10/2019 17:32   Dg Chest 1 View  Result Date: 03/25/2019 CLINICAL DATA:  78 year old female with a history of thoracentesis EXAM: CHEST  1 VIEW COMPARISON:  March 24, 2019, March 23, 2019 FINDINGS: Cardiomediastinal silhouette likely unchanged in size and contour. Decreased opacity at the left lung base with no visualized pneumothorax, and improved aeration. Blunting at the right costophrenic angle persists. Degenerative changes of the shoulders. Hazy opacity of the bilateral lower lungs. IMPRESSION: Decreased left-sided pleural fluid with no visualized pneumothorax. Persisting opacities at the bilateral lung base, including likely small trace right pleural effusion. Electronically Signed   By: Corrie Mckusick D.O.   On: 03/25/2019 15:48   Dg Chest 2 View  Result Date: 04/10/2019 CLINICAL DATA:  Shortness of breath EXAM: CHEST - 2 VIEW COMPARISON:  03/25/2019 FINDINGS: Large bilateral pleural effusion. Most of the heart is obscured but likely enlarged. Cephalized blood flow. No visible pneumothorax. Clip at the right hilum. IMPRESSION: Large pleural effusions and pulmonary vascular congestion. Electronically Signed   By: Monte Fantasia M.D.   On: 04/10/2019 12:45   Ct Chest W Contrast  Result Date: 04/20/2019 CLINICAL DATA:  78 year old female with known right-sided  breast cancer, right-sided abdominal pain and vomiting. EXAM: CT CHEST, ABDOMEN, AND PELVIS WITH CONTRAST TECHNIQUE: Multidetector CT imaging of the chest, abdomen and pelvis was performed  following the standard protocol during bolus administration of intravenous contrast. CONTRAST:  114mL OMNIPAQUE IOHEXOL 300 MG/ML  SOLN COMPARISON:  Prior CT scan of the chest 03/22/2019; prior CT scan of the abdomen and pelvis 12/14/2017 FINDINGS: CT CHEST FINDINGS Cardiovascular: The heart is normal in size. No pericardial effusion. Calcifications visualized along the coronary arteries. No significant aortic aneurysm or evidence of dissection. The main pulmonary artery is normal in caliber and relatively well opacified. No central pulmonary embolus. Mediastinum/Nodes: Dystrophic calcification in the left adrenal gland. No definite mediastinal or hilar lymph nodes. Lungs/Pleura: Large bilateral pleural effusions with associated significant lower lobe atelectasis bilaterally. The aerated portions of the lungs remain clear. No suspicious pulmonary mass or nodule visualized. Musculoskeletal: No acute fracture or aggressive appearing lytic or blastic osseous lesion. The right breast and chest wall are essentially completely replaced by extensive irregular soft tissue. There is marked skin thickening of the overlying breast. Soft tissue irregularity extends into the axilla and appears to encase the neurovascular bundle. Questionable involvement of the lateral aspect of the right first rib. CT ABDOMEN PELVIS FINDINGS Hepatobiliary: No focal liver abnormality is seen. Status post cholecystectomy. No biliary dilatation. Pancreas: The pancreatic head and uncinate process appear slightly indistinct, particularly compared to prior imaging. There is mild peripancreatic soft tissue stranding. Spleen: Normal in size without focal abnormality. Adrenals/Urinary Tract: The adrenal glands are unremarkable. No hydronephrosis or enhancing renal mass. No nephrolithiasis. Multiple circumscribed water attenuation simple cysts in the right kidney. Stomach/Bowel: No focal bowel wall thickening or evidence of obstruction.  Vascular/Lymphatic: No aneurysm or dissection. Scattered atherosclerotic vascular calcifications. The abdominal aorta is tortuous. No suspicious lymphadenopathy. Reproductive: Dystrophic calcified uterine fibroids. No adnexal masses. Other: Trace fluid in the region of the hepato duodenal ligament. Small amount of fluid tracks along the anterior pararenal fascia on the right. Musculoskeletal: No acute fracture or aggressive appearing lytic or blastic osseous lesion. Multilevel degenerative disc disease. IMPRESSION: CT CHEST 1. Continued progression of primary right breast cancer with extensive involvement of the right chest wall, axilla and right axillary neurovascular bundle. 2. Large bilateral pleural effusions with associated significant compressive atelectasis of both lower lobes. 3. Aortic and coronary artery calcifications. Aortic Atherosclerosis (ICD10-170.0). CT ABD/PELVIS 1. CT findings are suspicious for acute pancreatitis with indistinct and edematous pancreatic head and uncinate process and adjacent peripancreatic inflammatory stranding and trace fluid. 2. Additional ancillary findings as above. Electronically Signed   By: Jacqulynn Cadet M.D.   On: 04/20/2019 19:51   Ct Abdomen Pelvis W Contrast  Result Date: 04/20/2019 CLINICAL DATA:  78 year old female with known right-sided breast cancer, right-sided abdominal pain and vomiting. EXAM: CT CHEST, ABDOMEN, AND PELVIS WITH CONTRAST TECHNIQUE: Multidetector CT imaging of the chest, abdomen and pelvis was performed following the standard protocol during bolus administration of intravenous contrast. CONTRAST:  197mL OMNIPAQUE IOHEXOL 300 MG/ML  SOLN COMPARISON:  Prior CT scan of the chest 03/22/2019; prior CT scan of the abdomen and pelvis 12/14/2017 FINDINGS: CT CHEST FINDINGS Cardiovascular: The heart is normal in size. No pericardial effusion. Calcifications visualized along the coronary arteries. No significant aortic aneurysm or evidence of  dissection. The main pulmonary artery is normal in caliber and relatively well opacified. No central pulmonary embolus. Mediastinum/Nodes: Dystrophic calcification in the left adrenal gland. No definite mediastinal or hilar lymph nodes. Lungs/Pleura: Large bilateral  pleural effusions with associated significant lower lobe atelectasis bilaterally. The aerated portions of the lungs remain clear. No suspicious pulmonary mass or nodule visualized. Musculoskeletal: No acute fracture or aggressive appearing lytic or blastic osseous lesion. The right breast and chest wall are essentially completely replaced by extensive irregular soft tissue. There is marked skin thickening of the overlying breast. Soft tissue irregularity extends into the axilla and appears to encase the neurovascular bundle. Questionable involvement of the lateral aspect of the right first rib. CT ABDOMEN PELVIS FINDINGS Hepatobiliary: No focal liver abnormality is seen. Status post cholecystectomy. No biliary dilatation. Pancreas: The pancreatic head and uncinate process appear slightly indistinct, particularly compared to prior imaging. There is mild peripancreatic soft tissue stranding. Spleen: Normal in size without focal abnormality. Adrenals/Urinary Tract: The adrenal glands are unremarkable. No hydronephrosis or enhancing renal mass. No nephrolithiasis. Multiple circumscribed water attenuation simple cysts in the right kidney. Stomach/Bowel: No focal bowel wall thickening or evidence of obstruction. Vascular/Lymphatic: No aneurysm or dissection. Scattered atherosclerotic vascular calcifications. The abdominal aorta is tortuous. No suspicious lymphadenopathy. Reproductive: Dystrophic calcified uterine fibroids. No adnexal masses. Other: Trace fluid in the region of the hepato duodenal ligament. Small amount of fluid tracks along the anterior pararenal fascia on the right. Musculoskeletal: No acute fracture or aggressive appearing lytic or blastic  osseous lesion. Multilevel degenerative disc disease. IMPRESSION: CT CHEST 1. Continued progression of primary right breast cancer with extensive involvement of the right chest wall, axilla and right axillary neurovascular bundle. 2. Large bilateral pleural effusions with associated significant compressive atelectasis of both lower lobes. 3. Aortic and coronary artery calcifications. Aortic Atherosclerosis (ICD10-170.0). CT ABD/PELVIS 1. CT findings are suspicious for acute pancreatitis with indistinct and edematous pancreatic head and uncinate process and adjacent peripancreatic inflammatory stranding and trace fluid. 2. Additional ancillary findings as above. Electronically Signed   By: Jacqulynn Cadet M.D.   On: 04/20/2019 19:51   Dg Chest Port 1 View  Result Date: 04/22/2019 CLINICAL DATA:  Shortness of breath, weakness EXAM: PORTABLE CHEST 1 VIEW COMPARISON:  04/20/2019 FINDINGS: Moderate bilateral pleural effusions, left greater than right. Diffuse bibasilar airspace opacities, largely unchanged. Cardiomediastinal contour is obscured. No pneumothorax. IMPRESSION: Moderate bilateral pleural effusions, left greater than right, with associated bibasilar opacities. Findings not significantly changed compared to prior. Electronically Signed   By: Davina Poke M.D.   On: 04/22/2019 08:57   Dg Chest Port 1 View  Result Date: 04/20/2019 CLINICAL DATA:  77 year old female with shortness of breath EXAM: PORTABLE CHEST 1 VIEW COMPARISON:  Prior chest x-ray 04/14/2019 FINDINGS: Small to moderate bilateral layering pleural effusions larger on the left than the right. The cardiac contours are largely obscured. Suspect underlying cardiomegaly. Atherosclerotic calcifications present in the transverse aorta. Pulmonary vascular congestion is present. No evidence of pneumothorax. No acute osseous abnormality. Degenerative changes at the left glenohumeral joint. Soft tissue anchors in the right humeral head from  prior rotator cuff repair. IMPRESSION: 1. Small to moderate bilateral layering pleural effusions, larger on the left than the right. 2. Pulmonary vascular congestion with mild pulmonary edema. 3.  Aortic Atherosclerosis (ICD10-170.0) Electronically Signed   By: Jacqulynn Cadet M.D.   On: 04/20/2019 18:49   Ir Perc Pleural Drain W/indwell Cath W/img Guide  Result Date: 04/22/2019 INDICATION: 78 year old female with advanced metastatic right-sided breast cancer and bilateral pleural effusions. She presents today for placement of a left-sided tunneled pleural drainage catheter. EXAM: IR PERC PLEURAL DRAIN W/INDWELL CATH W/IMG GUIDE MEDICATIONS: 2 g Ancef ANESTHESIA/SEDATION:  1 mg Versed.  This is not conscious sedation. COMPLICATIONS: None immediate. PROCEDURE: Informed written consent was obtained from the patient after a thorough discussion of the procedural risks, benefits and alternatives. All questions were addressed. Maximal Sterile Barrier Technique was utilized including caps, mask, sterile gowns, sterile gloves, sterile drape, hand hygiene and skin antiseptic. A timeout was performed prior to the initiation of the procedure. Ultrasound was used to interrogate the left chest. There is a large pleural effusion. A suitable skin entry site was selected and marked. Following sterile prep and drape with chlorhexidine, local anesthesia was attained by infiltration with 1% lidocaine. A small dermatotomy was made. An 18 gauge sheath needle was then advanced through the dermatotomy and into the pleural fluid. The needle was removed while the sheath was left in the pleural space. A short Amplatz wire was advanced into the pleural space. A skin exit site approximately 5 cm anterior and medial to the pleural entry site was selected. Local anesthesia was attained by infiltration with 1% lidocaine. A second small dermatotomy was made. The PleurX catheter was then tunneled from the skin exit site to the dermatotomy  overlying the pleural entry site. A peel-away sheath was then advanced over the wire into the pleural space. The PleurX catheter was advanced through the peel-away sheath. The peel-away sheath was discarded. The catheter was connected to suction and aspiration performed. The catheter was secured to the skin with 0 Prolene suture. The dermatotomy overlying the pleural entry site was sealed with Dermabond. IMPRESSION: Successful placement of a left-sided tunneled pleural drainage catheter. Signed, Criselda Peaches, MD, [facility] Electronically Signed   By: Jacqulynn Cadet M.D.   On: 04/22/2019 16:25   US Thoracentesis Asp Pleural Space W/img Guide  Result Date: 04/14/2019 INDICATION: Patient with history of metastatic breast cancer, dyspnea, and recurrent bilateral pleural effusions. Request is made for diagnostic and therapeutic right thoracentesis. EXAM: ULTRASOUND GUIDED DIAGNOSTIC AND THERAPEUTIC RIGHT THORACENTESIS MEDICATIONS: 10 mL 1% lidocaine COMPLICATIONS: None immediate. PROCEDURE: An ultrasound guided thoracentesis was thoroughly discussed with the patient and questions answered. The benefits, risks, alternatives and complications were also discussed. The patient understands and wishes to proceed with the procedure. Written consent was obtained. Ultrasound was performed to localize and mark an adequate pocket of fluid in the right chest. The area was then prepped and draped in the normal sterile fashion. 1% Lidocaine was used for local anesthesia. Under ultrasound guidance a 6 Fr Safe-T-Centesis catheter was introduced. Thoracentesis was performed. The catheter was removed and a dressing applied. FINDINGS: A total of approximately 1.1 L of clear gold fluid was removed. Procedure was stopped after 1.1 L due to patient coughing. IMPRESSION: Successful ultrasound guided right thoracentesis yielding 1.1 L of pleural fluid. Read by:  Earley Abide, PA-C Electronically Signed   By: Corrie Mckusick D.O.   On: 04/14/2019 10:58   US Thoracentesis Asp Pleural Space W/img Guide  Result Date: 04/10/2019 INDICATION: Patient with history of metastatic breast cancer, dyspnea, bilateral pleural effusions. Request made for therapeutic left thoracentesis. EXAM: ULTRASOUND GUIDED THERAPEUTIC LEFT THORACENTESIS MEDICATIONS: None COMPLICATIONS: None immediate. PROCEDURE: An ultrasound guided thoracentesis was thoroughly discussed with the patient and questions answered. The benefits, risks, alternatives and complications were also discussed. The patient understands and wishes to proceed with the procedure. Written consent was obtained. Ultrasound was performed to localize and mark an adequate pocket of fluid in the left chest. The area was then prepped and draped in  the normal sterile fashion. 1% Lidocaine was used for local anesthesia. Under ultrasound guidance a 6 Fr Safe-T-Centesis catheter was introduced. Thoracentesis was performed. The catheter was removed and a dressing applied. FINDINGS: A total of approximately 1.2 liters of yellow fluid was removed. Due to patient coughing/chest discomfort only the above amount of fluid was removed today. IMPRESSION: Successful ultrasound guided therapeutic left thoracentesis yielding 1.2 liters of pleural fluid. Read by: Rowe Robert, PA-C Electronically Signed   By: Markus Daft M.D.   On: 04/10/2019 17:18   US Thoracentesis Asp Pleural Space W/img Guide  Result Date: 03/25/2019 INDICATION: Patient with history of metastatic breast cancer, dyspnea, bilateral pleural effusions. Status post right thoracentesis 03/24/2019; now request received for diagnostic and therapeutic left thoracentesis. EXAM: ULTRASOUND GUIDED DIAGNOSTIC AND THERAPEUTIC LEFT THORACENTESIS MEDICATIONS: None COMPLICATIONS: None immediate. PROCEDURE: An ultrasound guided thoracentesis was thoroughly discussed with the patient and questions  answered. The benefits, risks, alternatives and complications were also discussed. The patient understands and wishes to proceed with the procedure. Written consent was obtained. Ultrasound was performed to localize and mark an adequate pocket of fluid in the left chest. The area was then prepped and draped in the normal sterile fashion. 1% Lidocaine was used for local anesthesia. Under ultrasound guidance a 6 Fr Safe-T-Centesis catheter was introduced. Thoracentesis was performed. The catheter was removed and a dressing applied. FINDINGS: A total of approximately 1.4 liters of yellow fluid was removed. Samples were sent to the laboratory as requested by the clinical team. IMPRESSION: Successful ultrasound guided diagnostic and therapeutic left thoracentesis yielding 1.4 liters of pleural fluid. Read by: Rowe Robert, PA-C Electronically Signed   By: Markus Daft M.D.   On: 03/25/2019 17:27     Subjective: Eager to go home  Discharge Exam: Vitals:   04/24/19 0758 04/24/19 1156  BP:  130/70  Pulse:  88  Resp:  (!) 24  Temp:  98.6 F (37 C)  SpO2: 96% 94%   Vitals:   04/23/19 2149 04/24/19 0531 04/24/19 0758 04/24/19 1156  BP: 118/69 107/67  130/70  Pulse: (!) 103 94  88  Resp: 18 20  (!) 24  Temp: 98.3 F (36.8 C) 97.7 F (36.5 C)  98.6 F (37 C)  TempSrc: Oral Oral  Oral  SpO2: 96% 98% 96% 94%  Weight:      Height:        General: Pt is alert, awake, not in acute distress Cardiovascular: RRR, S1/S2 +, no rubs, no gallops Respiratory: CTA bilaterally, no wheezing, no rhonchi Abdominal: Soft, NT, ND, bowel sounds + Extremities: no edema, no cyanosis   The results of significant diagnostics from this hospitalization (including imaging, microbiology, ancillary and laboratory) are listed below for reference.     Microbiology: Recent Results (from the past 240 hour(s))  SARS CORONAVIRUS 2 (TAT 6-12 HRS) Nasal Swab Aptima Multi Swab     Status: None   Collection Time: 04/20/19   8:20 PM   Specimen: Aptima Multi Swab; Nasal Swab  Result Value Ref Range Status   SARS Coronavirus 2 NEGATIVE NEGATIVE Final    Comment: (NOTE) SARS-CoV-2 target nucleic acids are NOT DETECTED. The SARS-CoV-2 RNA is generally detectable in upper and lower respiratory specimens during the acute phase of infection. Negative results do not preclude SARS-CoV-2 infection, do not rule out co-infections with other pathogens, and should not be used as the sole basis for treatment or other patient management decisions. Negative results must be combined with clinical observations, patient  history, and epidemiological information. The expected result is Negative. Fact Sheet for Patients: SugarRoll.be Fact Sheet for Healthcare Providers: https://www.woods-mathews.com/ This test is not yet approved or cleared by the [place] FDA and  has been authorized for detection and/or diagnosis of SARS-CoV-2 by FDA under an Emergency Use Authorization (EUA). This EUA will remain  in effect (meaning this test can be used) for the duration of the COVID-19 declaration under Section 56 4(b)(1) of the Act, 21 U.S.C. section 360bbb-3(b)(1), unless the authorization is terminated or revoked sooner. Performed at [facility], [place] [address], [place],  [address]      Labs: BNP (last 3 results) Recent Labs    04/10/19 1202  BNP 0000000   Basic Metabolic Panel: Recent Labs  Lab 04/20/19 1749 04/21/19 0748  NA 139 138  K 4.6 3.9  CL 103 108  CO2 29 24  GLUCOSE 138* 98  BUN 13 12  CREATININE 0.62 0.59  CALCIUM 12.5* 10.5*  MG  --  2.3  PHOS  --  2.4*   Liver Function Tests: Recent Labs  Lab 04/20/19 1749 04/21/19 0748  AST 59* 40  ALT 25 18  ALKPHOS 112 85  BILITOT 0.8 0.9  PROT 7.1 5.9*  ALBUMIN 3.5 2.8*   Recent Labs  Lab 04/20/19 1749  LIPASE 185*   No results for input(s): AMMONIA in the last 168 hours. CBC: Recent Labs   Lab 04/20/19 1749 04/21/19 0748 04/22/19 0407 04/23/19 0705  WBC 8.1 10.5 14.7* 13.6*  HGB 12.6 11.7* 11.6* 11.5*  HCT 41.0 37.9 38.5 38.7  MCV 99.3 101.3* 101.9* 101.3*  PLT 345 316 331 358   Cardiac Enzymes: No results for input(s): CKTOTAL, CKMB, CKMBINDEX, TROPONINI in the last 168 hours. BNP: Invalid input(s): POCBNP CBG: No results for input(s): GLUCAP in the last 168 hours. D-Dimer No results for input(s): DDIMER in the last 72 hours. Hgb A1c No results for input(s): HGBA1C in the last 72 hours. Lipid Profile No results for input(s): CHOL, HDL, LDLCALC, TRIG, CHOLHDL, LDLDIRECT in the last 72 hours. Thyroid function studies No results for input(s): TSH, T4TOTAL, T3FREE, THYROIDAB in the last 72 hours.  Invalid input(s): FREET3 Anemia work up No results for input(s): VITAMINB12, FOLATE, FERRITIN, TIBC, IRON, RETICCTPCT in the last 72 hours. Urinalysis    Component Value Date/Time   COLORURINE YELLOW 04/21/2019 0438   APPEARANCEUR CLEAR 04/21/2019 0438   LABSPEC >1.046 (H) 04/21/2019 0438   PHURINE 5.0 04/21/2019 0438   GLUCOSEU NEGATIVE 04/21/2019 0438   HGBUR NEGATIVE 04/21/2019 0438   BILIRUBINUR NEGATIVE 04/21/2019 0438   KETONESUR NEGATIVE 04/21/2019 0438   PROTEINUR NEGATIVE 04/21/2019 0438   UROBILINOGEN 1.0 12/01/2014 0914   NITRITE NEGATIVE 04/21/2019 0438   LEUKOCYTESUR SMALL (A) 04/21/2019 0438   Sepsis Labs Invalid input(s): PROCALCITONIN,  WBC,  LACTICIDVEN Microbiology Recent Results (from the past 240 hour(s))  SARS CORONAVIRUS 2 (TAT 6-12 HRS) Nasal Swab Aptima Multi Swab     Status: None   Collection Time: 04/20/19  8:20 PM   Specimen: Aptima Multi Swab; Nasal Swab  Result Value Ref Range Status   SARS Coronavirus 2 NEGATIVE NEGATIVE Final    Comment: (NOTE) SARS-CoV-2 target nucleic acids are NOT DETECTED. The SARS-CoV-2 RNA is generally detectable in upper and lower respiratory specimens during the acute phase of infection.  Negative results do not preclude SARS-CoV-2 infection, do not rule out co-infections with other pathogens, and should not be used as the sole basis for treatment  or other patient management decisions. Negative results must be combined with clinical observations, patient history, and epidemiological information. The expected result is Negative. Fact Sheet for Patients: SugarRoll[employer]be Fact Sheet for Healthcare Providers: https://www.woods-mathews.com/ This test is not yet approved or cleared by the [place] FDA and  has been authorized for detection and/or diagnosis of SARS-CoV-2 by FDA under an Emergency Use Authorization (EUA). This EUA will remain  in effect (meaning this test can be used) for the duration of the COVID-19 declaration under Section 56 4(b)(1) of the Act, 21 U.S.C. section 360bbb-3(b)(1), unless the authorization is terminated or revoked sooner. Performed at [facility], [place] [address], [place], [place] [address]    Time spent: 30 min  SIGNED:   Marylu Lund, MD  [facility] 04/24/2019, 2:33 PM  If 7PM-7AM, please contact night-coverage

## 2019-04-25 ENCOUNTER — Encounter: Payer: Self-pay | Admitting: Internal Medicine

## 2019-04-25 NOTE — Progress Notes (Unsigned)
Met with patient today at her home to discuss goals of care and concerns Related to enrolling in hospice care.  Dulcolax Gel Cushion Chux Calmiseptine

## 2019-04-26 ENCOUNTER — Telehealth: Payer: Self-pay

## 2019-04-26 DIAGNOSIS — C50311 Malignant neoplasm of lower-inner quadrant of right female breast: Secondary | ICD-10-CM

## 2019-04-26 NOTE — Telephone Encounter (Signed)
Palliative Medicine RN Note: Rec'd a call from Dr Hilma Favors. She had a home visit after hours for a family meeting with Peggy Welch's family. Dr Hilma Favors asked that I set up hospice for this patient.   Ambulatory order placed in Epic.  Called referral in to [facility] (previously [facility]).   Marjie Skiff Jamice Carreno, RN, BSN, [facility] Palliative Medicine Team 04/26/2019 9:27 AM Office [phone]

## 2019-04-28 ENCOUNTER — Other Ambulatory Visit: Payer: Self-pay | Admitting: Internal Medicine

## 2019-04-28 ENCOUNTER — Inpatient Hospital Stay: Payer: Medicare Other

## 2019-04-28 ENCOUNTER — Inpatient Hospital Stay: Payer: Medicare Other | Admitting: Hematology

## 2019-04-28 MED ORDER — FUROSEMIDE 20 MG PO TABS: 20.0000 mg | ORAL_TABLET | Freq: Every day | ORAL | 3 refills | Status: AC | PRN

## 2019-04-28 MED ORDER — CLOTRIMAZOLE-BETAMETHASONE 1-0.05 % EX CREA: 1.0000 "application " | TOPICAL_CREAM | Freq: Two times a day (BID) | CUTANEOUS | 0 refills | Status: AC

## 2019-04-30 DIAGNOSIS — J45909 Unspecified asthma, uncomplicated: Secondary | ICD-10-CM | POA: Diagnosis not present

## 2019-05-01 DIAGNOSIS — J449 Chronic obstructive pulmonary disease, unspecified: Secondary | ICD-10-CM | POA: Diagnosis not present

## 2019-05-22 ENCOUNTER — Other Ambulatory Visit: Payer: Self-pay | Admitting: Hematology

## 2019-05-22 DIAGNOSIS — Z170 Estrogen receptor positive status [ER+]: Secondary | ICD-10-CM

## 2019-05-22 DIAGNOSIS — C50311 Malignant neoplasm of lower-inner quadrant of right female breast: Secondary | ICD-10-CM

## 2019-05-30 DIAGNOSIS — J45909 Unspecified asthma, uncomplicated: Secondary | ICD-10-CM | POA: Diagnosis not present

## 2019-06-26 DEATH — deceased

## 2020-02-25 IMAGING — CR CHEST - 2 VIEW
2 series · 2 of 2 positions shown · non-contrast
Comparison: Chest CT, 12/14/2017. Chest radiographs, 11/30/2014 and
earlier.

CLINICAL DATA: 77 y.o female sent over from PCP for difficulty
swallowing, productive coughing, and low 02. 95% RA in triage.
Denies chest pain or shob. Hx. Asthma, HTN, PNA, and rt breast
cancer

EXAM:
CHEST - 2 VIEW

[w chest pa]
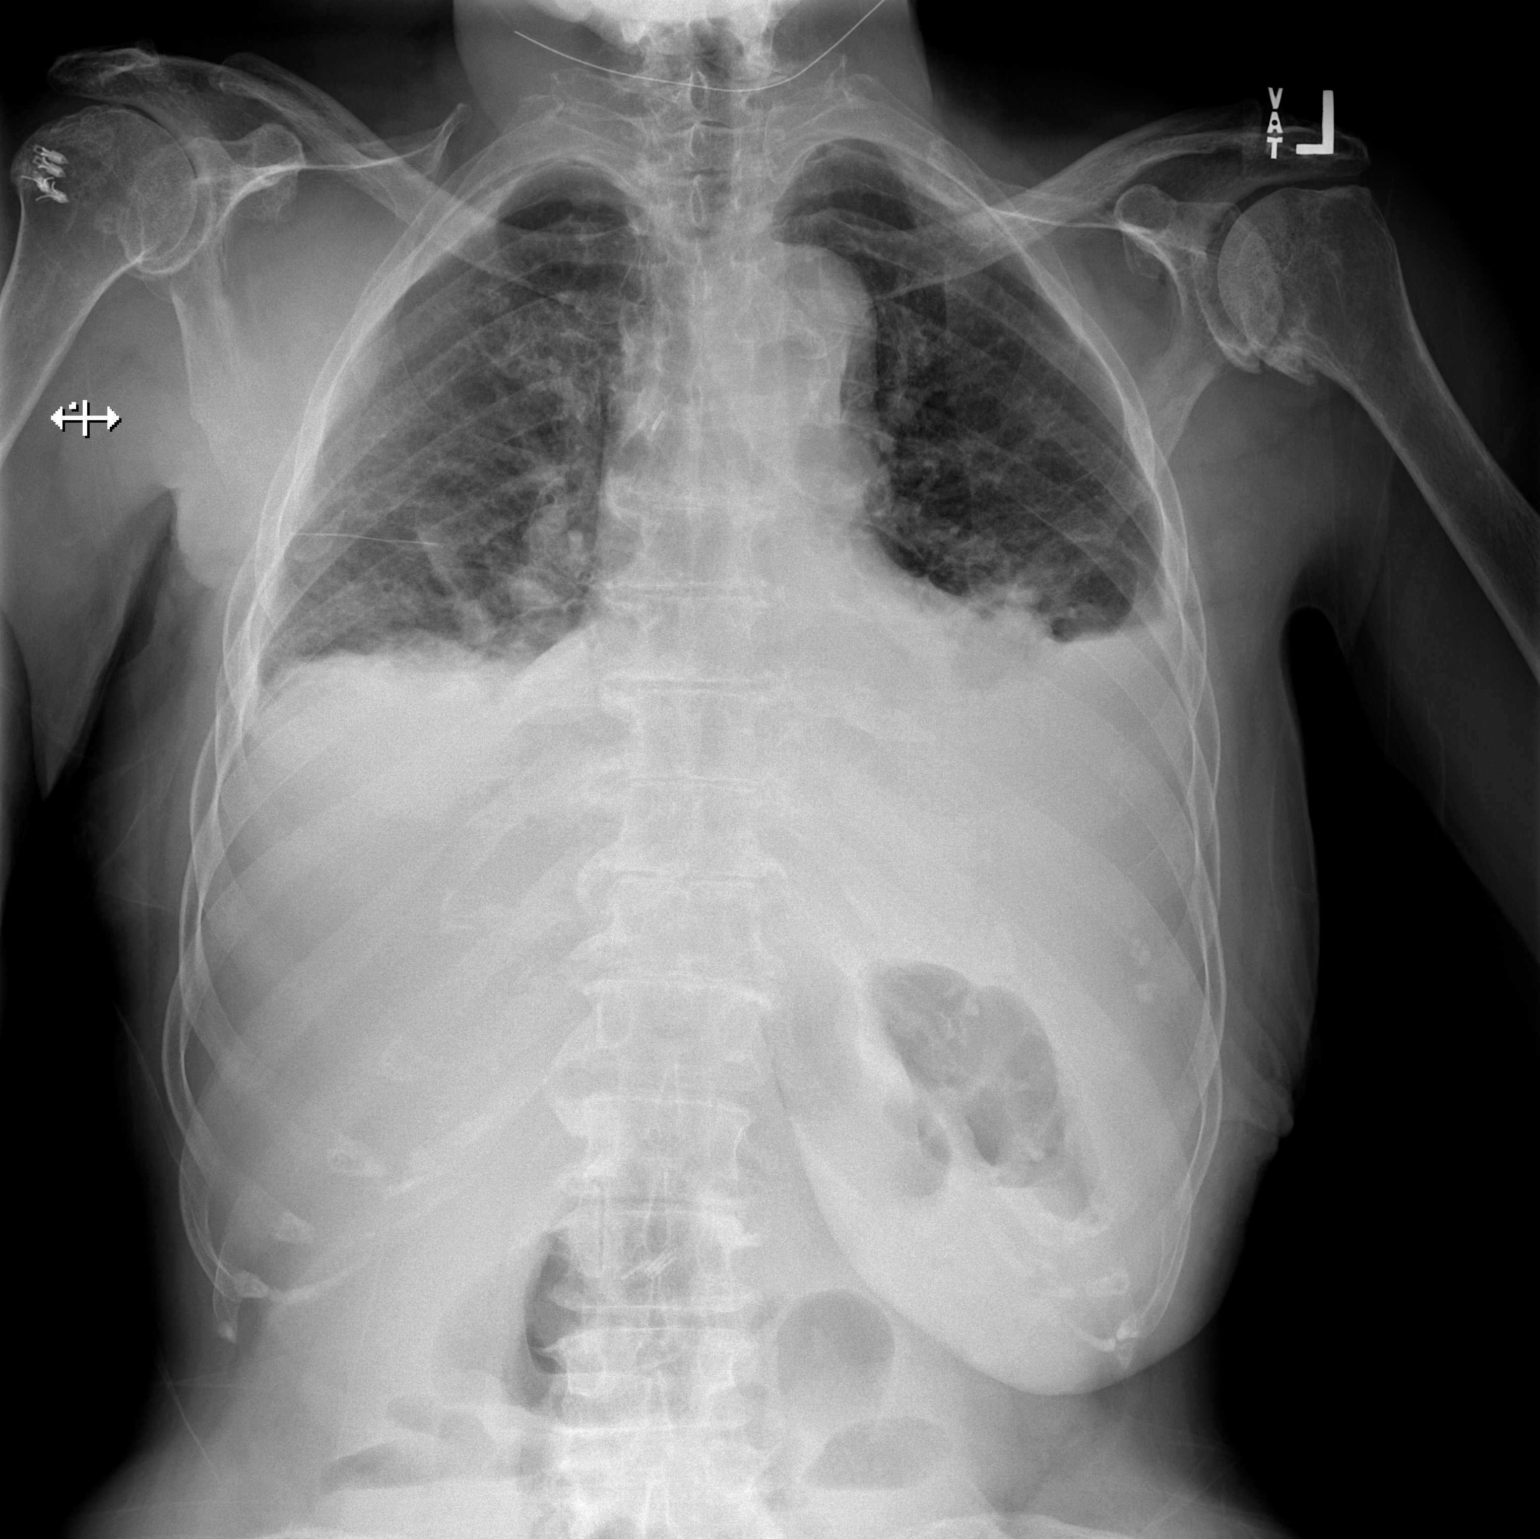

[w chest lat]
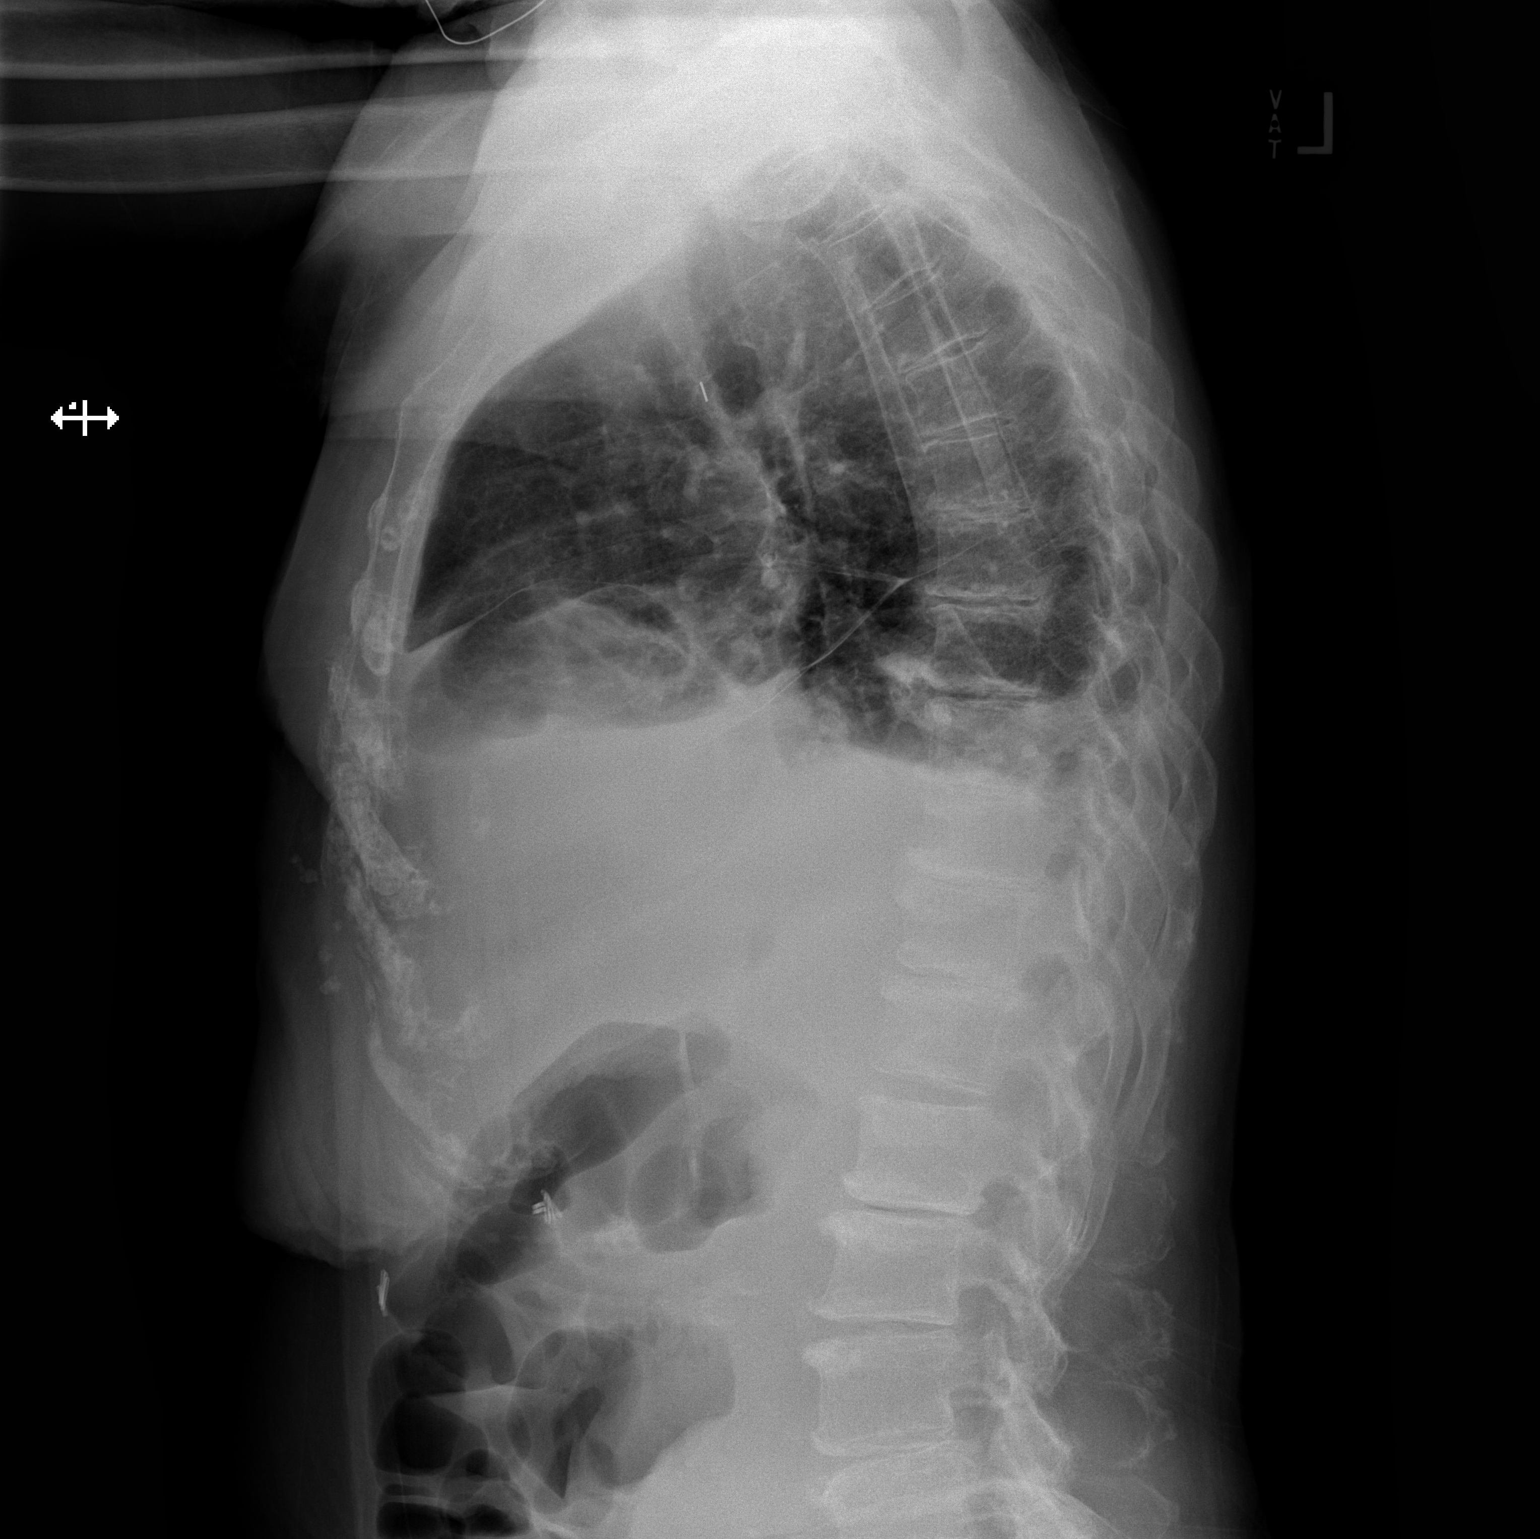

[2 of 2 positions shown; findings below may reference images not displayed]

FINDINGS: Moderate bilateral pleural effusions obscure the hemidiaphragms and
most of the heart borders. There is additional lung base opacity
consistent with atelectasis. Remainder of the lungs is clear. No
pneumothorax.

Cardiac silhouette is top-normal in size. No mediastinal or hilar
masses.

Skeletal structures are intact.
IMPRESSION: 1. Moderate bilateral pleural effusions with associated basilar
atelectasis. Cannot exclude underlying pneumonia. No evidence of
pulmonary edema.

## 2020-03-15 IMAGING — DX CHEST  1 VIEW
1 series · 1 of 1 positions shown · non-contrast
Comparison: A [DATE]

CLINICAL DATA: Status post left-sided thoracentesis.

EXAM:
CHEST  1 VIEW

[chest ap]
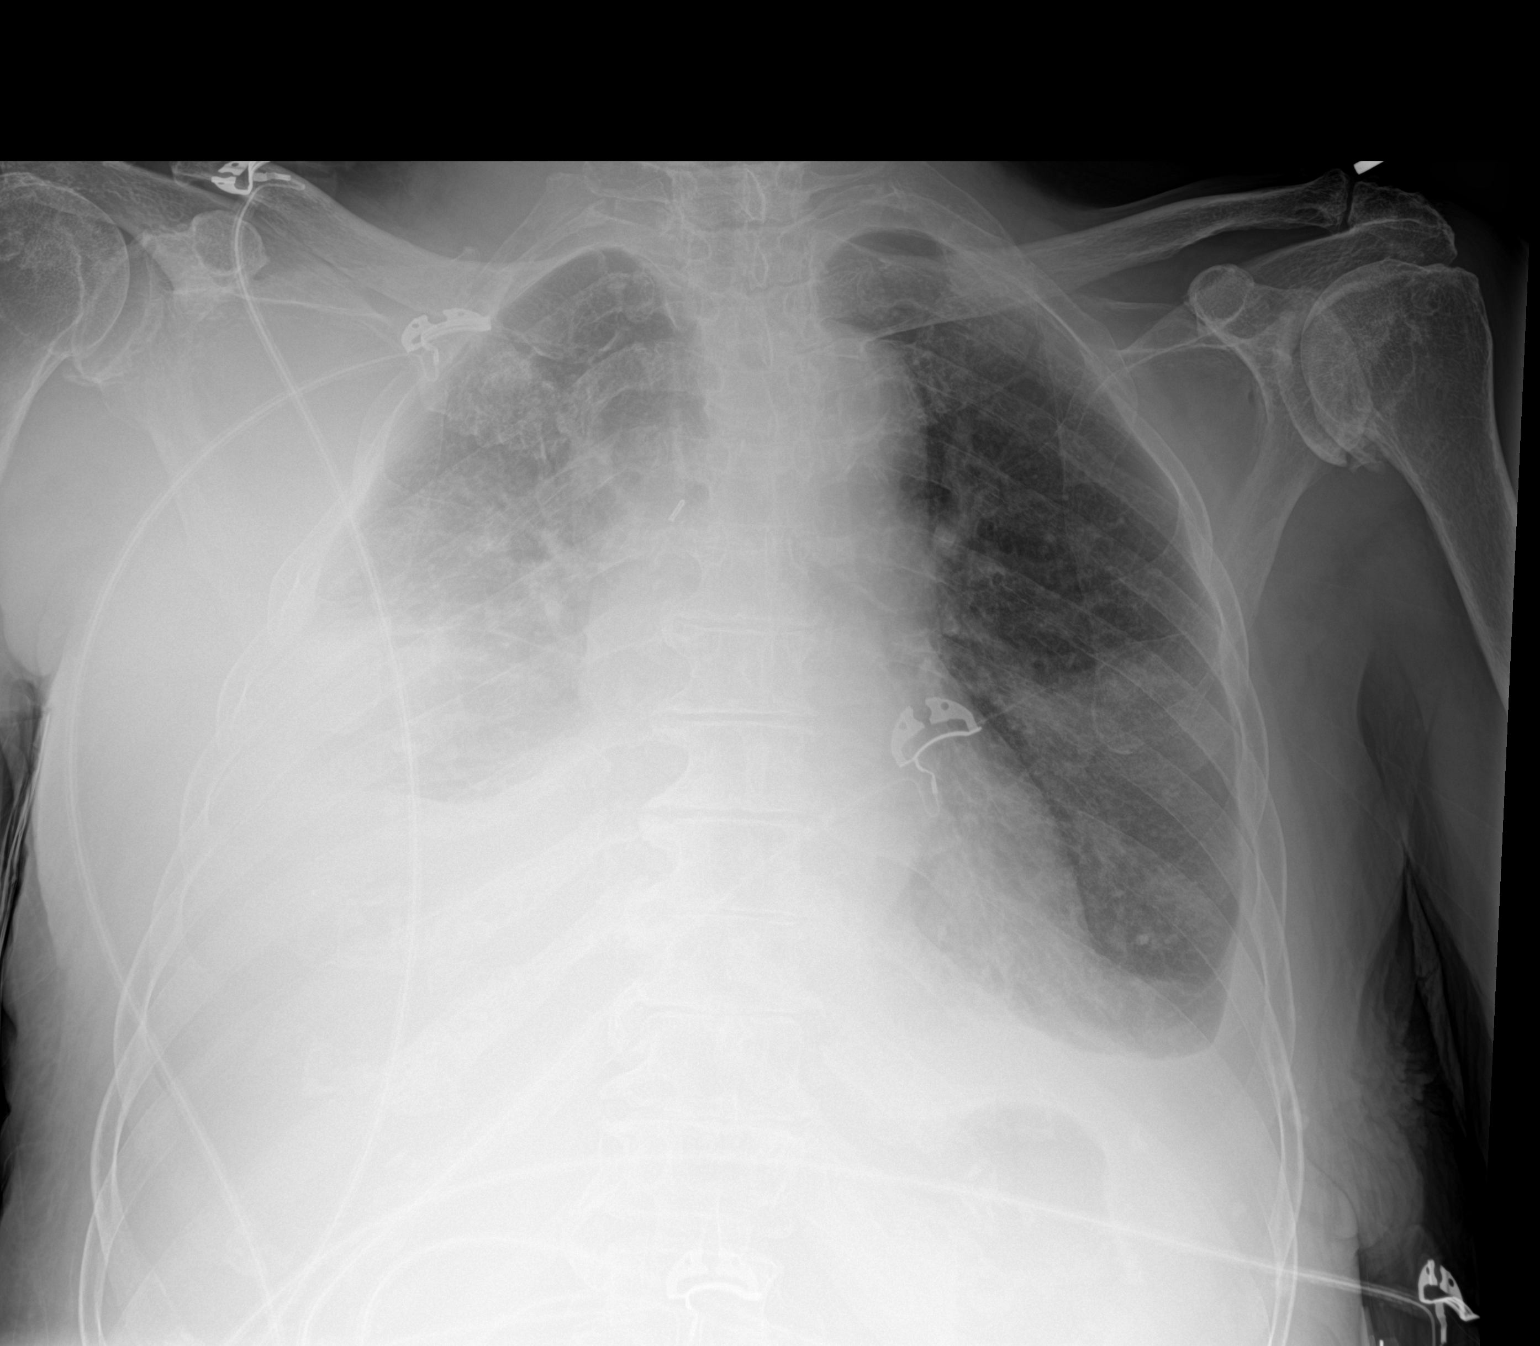

[1 of 1 positions shown; findings below may reference images not displayed]

FINDINGS: The patient has undergone left-sided thoracentesis. There is no
evidence for left-sided pneumothorax. There is a small left-sided
pleural effusion, substantially improved from prior study. There is
a persistent moderate to large right-sided pleural effusion. The
heart size is stable. There is presumed atelectasis at the lung
bases. There are advanced degenerative changes of both glenohumeral
joints. There is no right-sided pneumothorax. There is no definite
acute osseous abnormality.
IMPRESSION: 1. No pneumothorax status post left-sided thoracentesis.
2. Small residual left-sided pleural effusion, significantly
improved from prior study.
3. Persistent moderate to large right-sided pleural effusion.
4. Bibasilar airspace opacities, favored to represent atelectasis.

## 2020-03-19 IMAGING — DX CHEST  1 VIEW
1 series · 1 of 1 positions shown · non-contrast
Comparison: 04/10/2019

CLINICAL DATA: 77-year-old female with a history of malignant
effusion, status post thoracentesis

EXAM:
CHEST  1 VIEW

[chest ap]
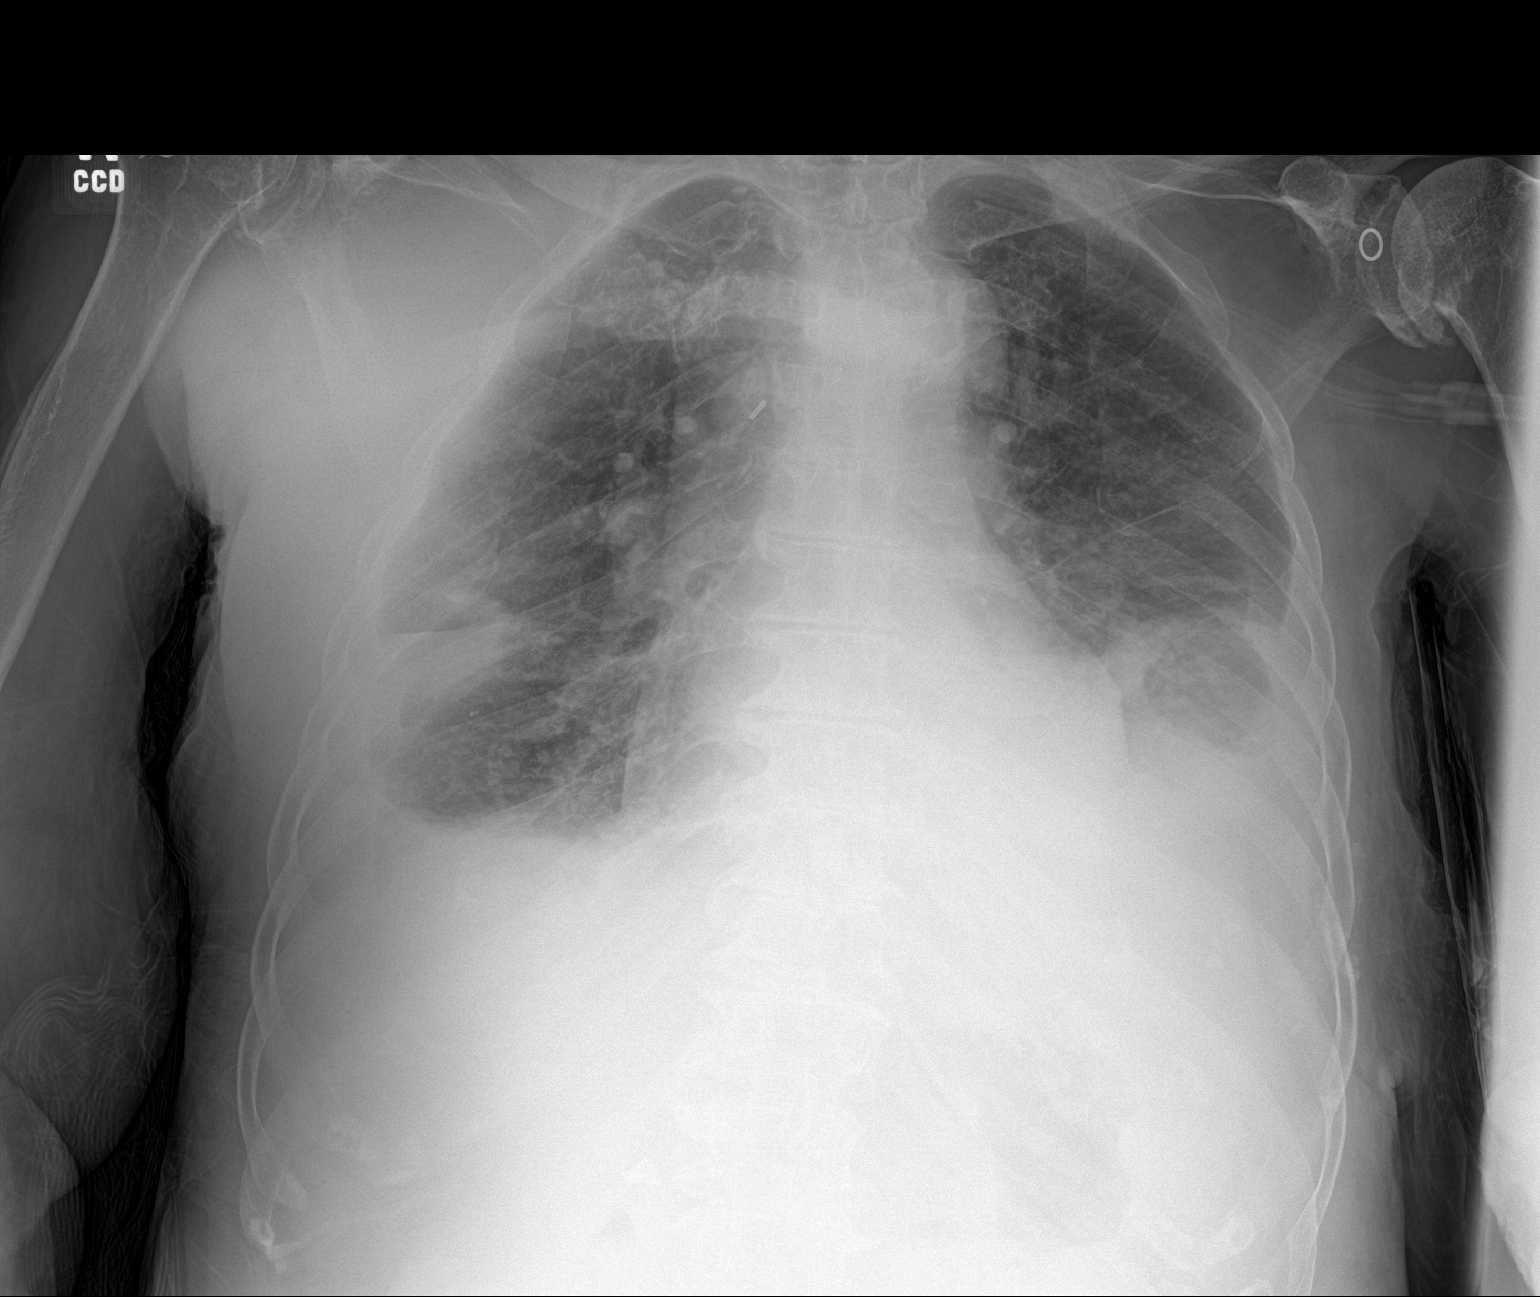

[1 of 1 positions shown; findings below may reference images not displayed]

FINDINGS: Cardiomediastinal silhouette unchanged in size and contour,
partially obscured by overlying lung and pleural disease.

Improved aeration of the right lung status post thoracentesis with
decreased pleural fluid. Partial obscuration the right
hemidiaphragm.

Persisting opacity at the left lung base with partial obscuration
left hemidiaphragm and the left heart border.

No pneumothorax.
IMPRESSION: Decreased right-sided pleural fluid status post thoracentesis with
no visualized pneumothorax.

Small left pleural effusion with associated
atelectasis/consolidation.

## 2020-03-19 IMAGING — US US THORACENTESIS ASP PLEURAL SPACE W/IMG GUIDE
1 series · 8 of 8 positions shown · non-contrast
Comparison: none

INDICATION: Patient with history of metastatic breast cancer, dyspnea, and
recurrent bilateral pleural effusions. Request is made for
diagnostic and therapeutic right thoracentesis.

[Series 1: us thoracentesis asp pleural space w/img guide · 8 of 8 slices shown]
[im 1/8]
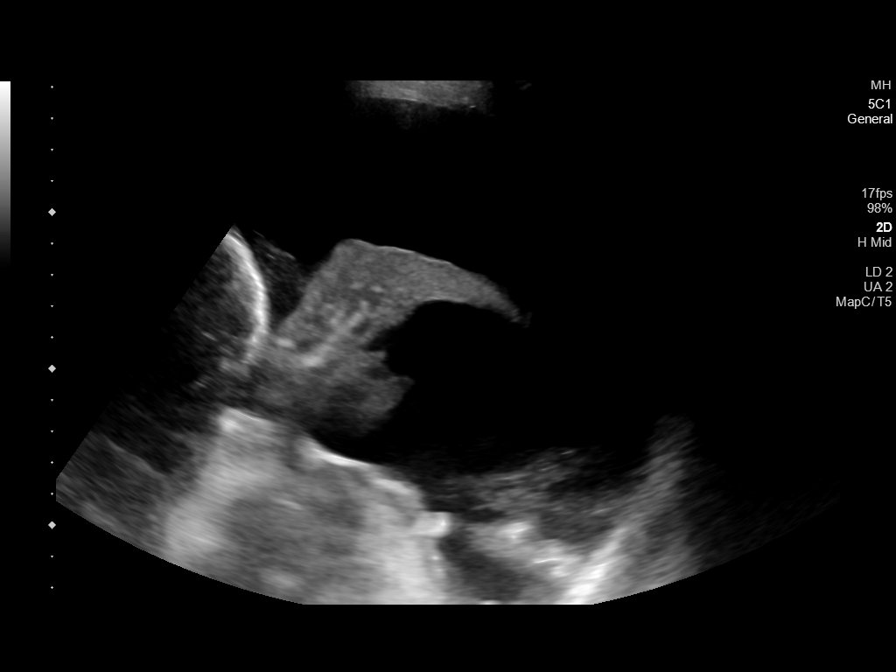
[im 2/8]
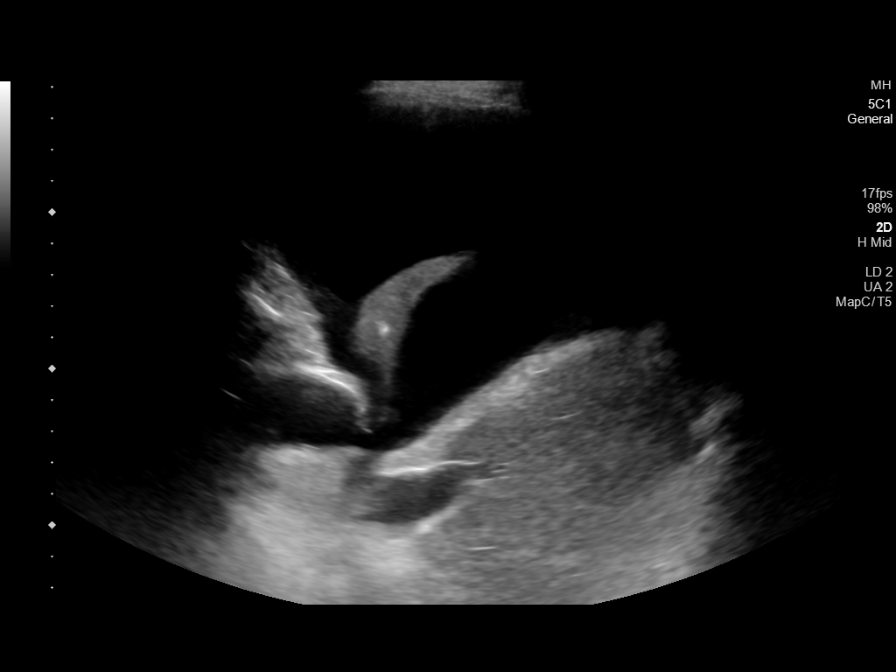
[im 3/8]
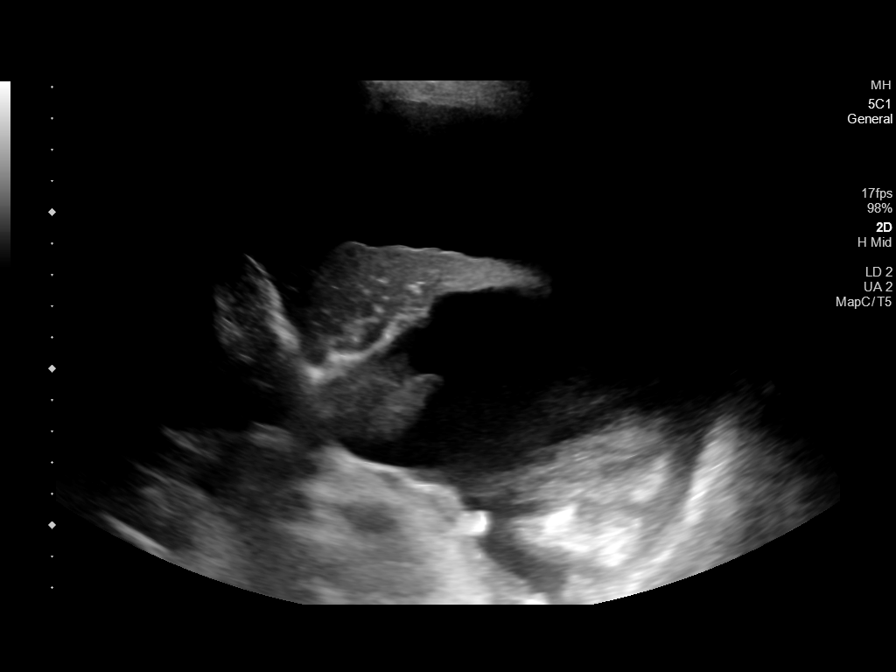
[im 4/8]
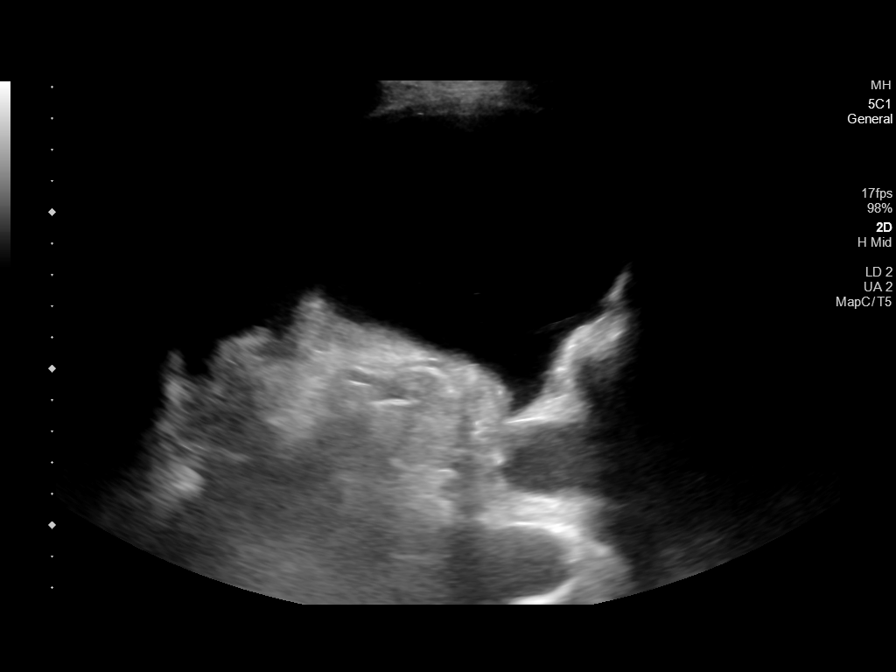
[im 5/8]
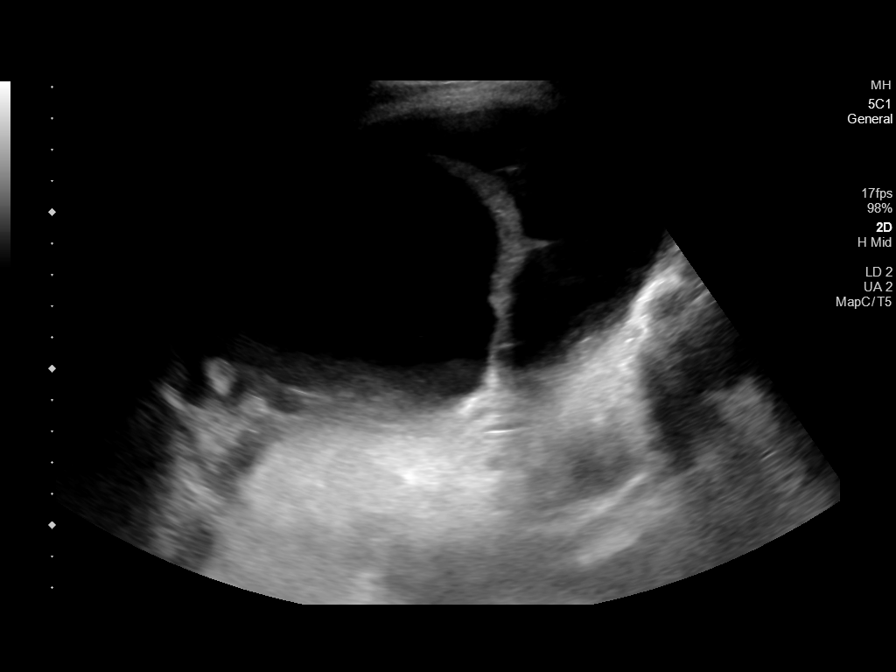
[im 6/8]
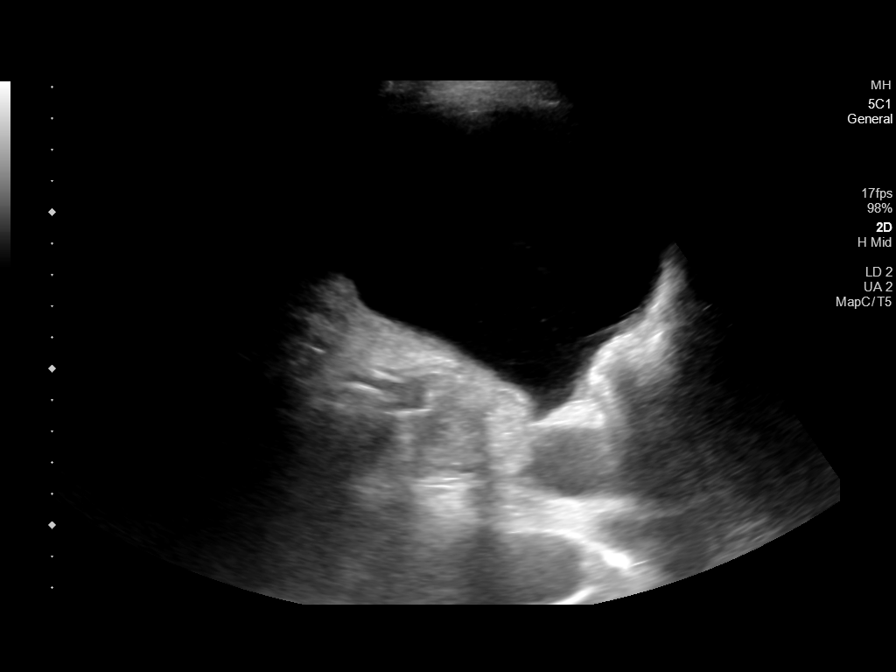
[im 7/8]
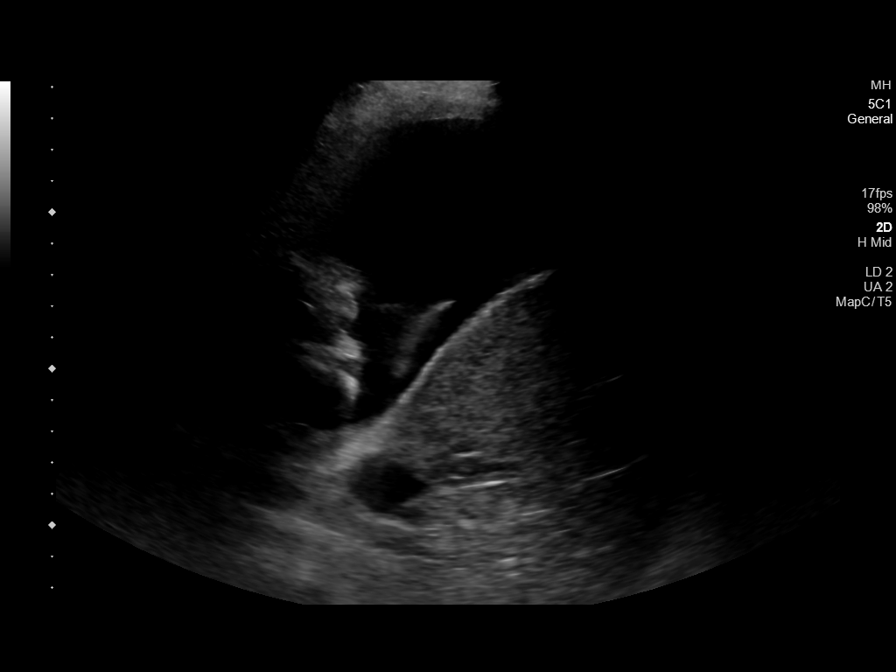
[im 8/8]
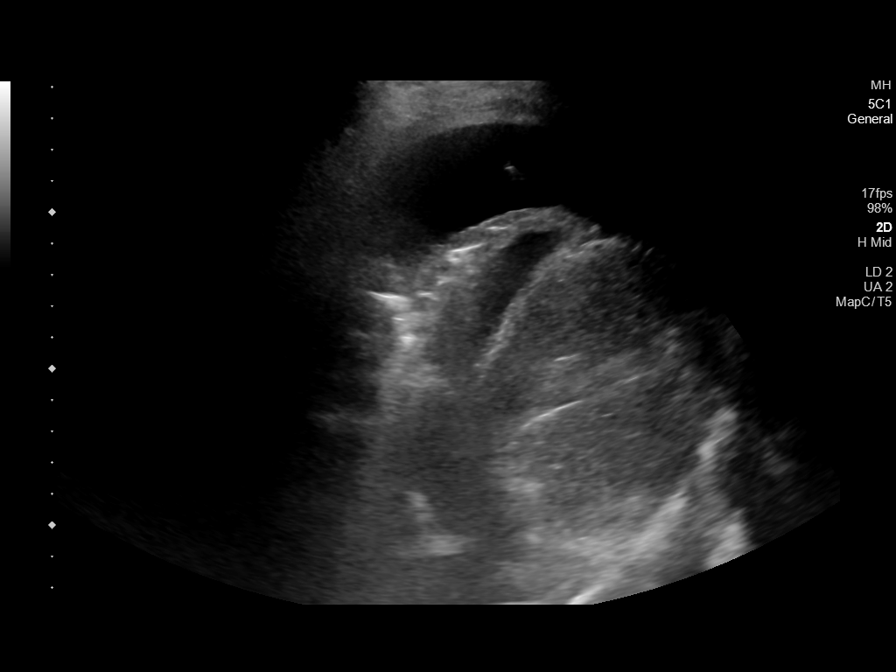

[8 of 8 positions shown; findings below may reference images not displayed]

EXAM:
ULTRASOUND GUIDED DIAGNOSTIC AND THERAPEUTIC RIGHT THORACENTESIS

MEDICATIONS:
10 mL 1% lidocaine

COMPLICATIONS:
None immediate.

PROCEDURE:
An ultrasound guided thoracentesis was thoroughly discussed with the
patient and questions answered. The benefits, risks, alternatives
and complications were also discussed. The patient understands and
wishes to proceed with the procedure. Written consent was obtained.

Ultrasound was performed to localize and mark an adequate pocket of
fluid in the right chest. The area was then prepped and draped in
the normal sterile fashion. 1% Lidocaine was used for local
anesthesia. Under ultrasound guidance a 6 Fr Safe-T-Centesis
catheter was introduced. Thoracentesis was performed. The catheter
was removed and a dressing applied.
FINDINGS: A total of approximately 1.1 L of clear gold fluid was removed.
Procedure was stopped after 1.1 L due to patient coughing.
IMPRESSION: Successful ultrasound guided right thoracentesis yielding 1.1 L of
pleural fluid.

## 2020-03-27 IMAGING — DX PORTABLE CHEST - 1 VIEW
1 series · 1 of 1 positions shown · non-contrast
Comparison: 04/20/2019

CLINICAL DATA: Shortness of breath, weakness

EXAM:
PORTABLE CHEST 1 VIEW

[chest ap]
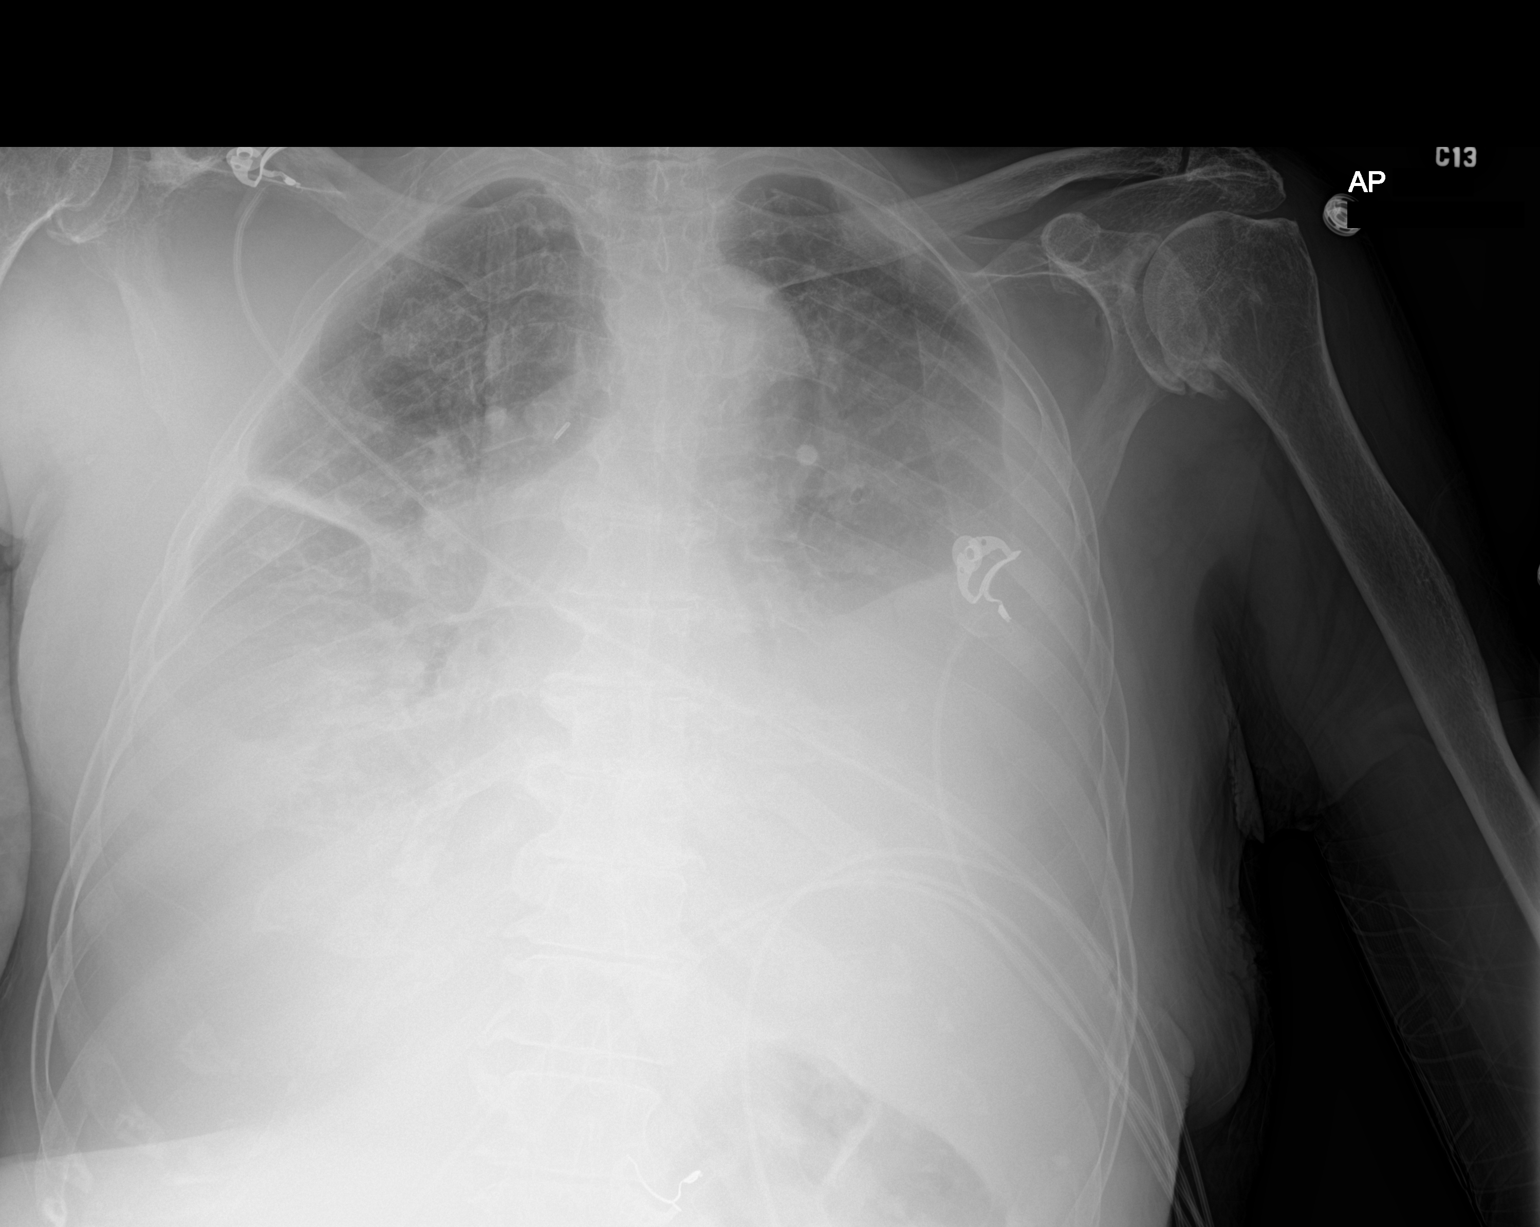

[1 of 1 positions shown; findings below may reference images not displayed]

FINDINGS: Moderate bilateral pleural effusions, left greater than right.
Diffuse bibasilar airspace opacities, largely unchanged.
Cardiomediastinal contour is obscured. No pneumothorax.
IMPRESSION: Moderate bilateral pleural effusions, left greater than right, with
associated bibasilar opacities. Findings not significantly changed
compared to prior.
